# Patient Record
Sex: Male | Born: 1950
Health system: Southern US, Community
[De-identification: ages and names within clinical notes are randomized; demographics above are authoritative.]

## PROBLEM LIST (undated history)

## (undated) DIAGNOSIS — M1990 Unspecified osteoarthritis, unspecified site: Secondary | ICD-10-CM

## (undated) DIAGNOSIS — M20019 Mallet finger of unspecified finger(s): Secondary | ICD-10-CM

## (undated) DIAGNOSIS — E785 Hyperlipidemia, unspecified: Secondary | ICD-10-CM

## (undated) DIAGNOSIS — T7840XA Allergy, unspecified, initial encounter: Secondary | ICD-10-CM

## (undated) DIAGNOSIS — S76311A Strain of muscle, fascia and tendon of the posterior muscle group at thigh level, right thigh, initial encounter: Secondary | ICD-10-CM

## (undated) DIAGNOSIS — M2340 Loose body in knee, unspecified knee: Secondary | ICD-10-CM

## (undated) DIAGNOSIS — M7660 Achilles tendinitis, unspecified leg: Secondary | ICD-10-CM

## (undated) DIAGNOSIS — I499 Cardiac arrhythmia, unspecified: Secondary | ICD-10-CM

## (undated) DIAGNOSIS — I10 Essential (primary) hypertension: Secondary | ICD-10-CM

## (undated) DIAGNOSIS — G2581 Restless legs syndrome: Secondary | ICD-10-CM

## (undated) HISTORY — DX: Achilles tendinitis, unspecified leg: M76.60

## (undated) HISTORY — DX: Restless legs syndrome: G25.81

## (undated) HISTORY — DX: Mallet finger of unspecified finger(s): M20.019

## (undated) HISTORY — DX: Strain of muscle, fascia and tendon of the posterior muscle group at thigh level, right thigh, initial encounter: S76.311A

## (undated) HISTORY — DX: Allergy, unspecified, initial encounter: T78.40XA

## (undated) HISTORY — PX: SPINE SURGERY: SHX786

## (undated) HISTORY — DX: Hyperlipidemia, unspecified: E78.5

## (undated) HISTORY — DX: Unspecified osteoarthritis, unspecified site: M19.90

## (undated) HISTORY — DX: Loose body in knee, unspecified knee: M23.40

---

## 2004-05-08 ENCOUNTER — Emergency Department (HOSPITAL_COMMUNITY): Admission: EM | Admit: 2004-05-08 | Discharge: 2004-05-09 | Payer: Self-pay | Admitting: Emergency Medicine

## 2004-06-15 ENCOUNTER — Encounter: Admission: RE | Admit: 2004-06-15 | Discharge: 2004-06-15 | Payer: Self-pay | Admitting: Internal Medicine

## 2010-06-02 ENCOUNTER — Ambulatory Visit: Payer: Self-pay | Admitting: Sports Medicine

## 2010-06-02 DIAGNOSIS — M2340 Loose body in knee, unspecified knee: Secondary | ICD-10-CM

## 2010-06-02 DIAGNOSIS — M25569 Pain in unspecified knee: Secondary | ICD-10-CM

## 2010-06-02 HISTORY — DX: Loose body in knee, unspecified knee: M23.40

## 2010-11-25 ENCOUNTER — Ambulatory Visit: Admission: RE | Admit: 2010-11-25 | Discharge: 2010-11-25 | Payer: Self-pay | Source: Home / Self Care

## 2010-11-25 DIAGNOSIS — M79609 Pain in unspecified limb: Secondary | ICD-10-CM | POA: Insufficient documentation

## 2010-11-25 DIAGNOSIS — R269 Unspecified abnormalities of gait and mobility: Secondary | ICD-10-CM | POA: Insufficient documentation

## 2010-12-15 NOTE — Assessment & Plan Note (Signed)
Summary: 11:30 APPT POSSIBLE PFSS/MJD   Vital Signs:  Patient profile:   60 year old male Height:      71 inches Weight:      180.25 pounds BMI:     25.23 Pulse rate:   73 / minute BP sitting:   112 / 73  (left arm)  Vitals Entered By: Terese Door (June 02, 2010 12:07 PM) CC: NP-Possible PFSS left knee   CC:  NP-Possible PFSS left knee.  History of Present Illness: c/o Left ant knee pain started catching and locking just above knee cap wears knee strap by [place] This helps he thinks this started [place]  taking glucosamine and does feel better  even biking hurt at worst  elliptical is OK  doing some HS curls now at [employer]  ? if any swelling but no locking or giving way although he will get the catch that causes sharp pain  Physical Exam  General:  Well-developed,well-nourished,in no acute distress; alert,appropriate and cooperative throughout examination Msk:  knee exam shows no effusion; stable ligaments; negative Mcmurray's and provocative meniscal tests;  he soes get an occassional catch and some painful patellar compression;  patellar and quadriceps tendons unremarkable. some pat pouch swelling lags 2 deg from full extension on LT  Additional Exam:  MSK [place] left knee shows increase in fluid in suprapatellar pouch compared to RT bothe med and lat meniscus are fine pat tendon norm quad tendon looks intact except on undersurface there is a loose body that seems partially calcified  images saved   Impression & Recommendations:  Problem # 1:  KNEE PAIN, LEFT (ICD-719.46)  use strap and icing to control sxs quad exercises w limited knee bend ice after runs and avoid any fast downhills  if not better in 6 wks we could try injectin into loose body to try to get to resorb  Orders: [place] LIMITED ([id number])  Problem # 2:  LOOSE BODY IN KNEE (ICD-717.6)  will follow I hope w conservative care he can avoid having excision  reck 6  wks  Orders: [place] LIMITED ([id number])

## 2010-12-17 NOTE — Assessment & Plan Note (Signed)
Summary: poss pfss/ACM   Vital Signs:  Patient profile:   60 year old male BP sitting:   149 / 78  Vitals Entered By: Lillia Pauls CMA (November 25, 2010 9:49 AM) CC: poss pfss   CC:  poss pfss.  History of Present Illness: Pt here for f/u of L lateral knee pain, which has worsened since last visit.  Limps with running.   The knee has a loose calcific body in quad tendon which catches under sup knee  This was visualized on [place] at last exam gets some swelling but not always more with increased activity or running this pain increased after he did a hilly race  Also has upper hamstring pain on rt, very tight with running.    Takes meloxicam for pain, this is helpful.  Glenda has been running for 45 years multiple injuries to piriformis on RT starting in 1984 has had progressive inc in RT foot turnout 2/2 tightness  This is also associated w increase in RT HS pain at upper insertion  Physical Exam  General:  Well-developed,well-nourished,in no acute distress; alert,appropriate and cooperative throughout examination Msk:  L knee loose body in quad tendon This catches and pops under sup patella with knee flexion  LEFT lacks 3 degrees full knee extension lacks 30 degrees full knee flexion No visible effusion L knee ACL intact MCL intact LCL intact  Excellent abduction strength bilat Good hamstring strength bilat Good quad strength bilat FABER very tight bilat Very tight hamstring extension L > R Standing hip rotation better than lying bilat Extremities:  Liston Alba is antlagic does not push off left side fully on RT side he has about 30 deg of ECT rotation of foot this causes even less effective pushoff Additional Exam:  MSK [place] Left quad tendon has a 1 cm longitudinal calcification trans view shows this only 3 mm wide slt increase in doppler flow  There is also a samll suprapatellar puch effusion noted Pat tendon normal menisci normal small spur on joint line but not  significant  compared to last scan calcific area is thinner appearing   Impression & Recommendations:  Problem # 1:  LOOSE BODY IN KNEE (ICD-717.6)  This seems to be a partial tear of quad tendon that has now calcified and seems more incoroporated into tendon  will try him on NTG protocol I think injection would be difficult without risk of tearing tendon  reck in 1 mo and rescan tendon  Orders: [place] LIMITED ([id number])  Problem # 2:  KNEE PAIN, LEFT (ICD-719.46)  His updated medication list for this problem includes:    Meloxicam 15 Mg Tabs (Meloxicam) .Marland Kitchen... Take 1 by mouth once daily  uses meloxicam as needed  warned sideeffects  Orders: [place] LIMITED ([id number])  Problem # 3:  ABNORMALITY OF GAIT (ICD-781.2) He has  a contracture of piriformis I suspect that is really altering gait  now complicated by his loose body  given stretches and will evaluate on RTC  Problem # 4:  THIGH PAIN (ICD-729.5)  Orders: Garment,belt,sleeve or other covering ,elastic or similar stretch (U1324) this seems like chronic HS tightness  will try compression sleeve  Complete Medication List: 1)  Nitroglycerin 0.1 Mg/hr Pt24 (Nitroglycerin) .... Apply 1/2 patch to left knee daily as directed.  change daily 2)  Meloxicam 15 Mg Tabs (Meloxicam) .... Take 1 by mouth once daily  Patient Instructions: 1)  Wear thigh sleeve on rt thigh over area of tightness and pain. 2)  Use 1/2  nitroglycerin patch over 1 cm area of left knee daily.  Change patch every day. 3)  Return for follow up in 1 month Prescriptions: MELOXICAM 15 MG TABS (MELOXICAM) take 1 by mouth once daily  #90 x 3   Entered and Authorized by:   Enid Baas MD   Signed by:   Enid Baas MD on 11/25/2010   Method used:   Electronically to        [facility]  [phone]* (retail)       [address] [place], [place]  [address]       Ph: [phone] or [phone]       Fax: [phone]   RxID:   [id number] NITROGLYCERIN  0.1 MG/HR PT24 (NITROGLYCERIN) Apply 1/2 patch to left knee daily as directed.  Change daily  #30 x 2   Entered and Authorized by:   Enid Baas MD   Signed by:   Enid Baas MD on 11/25/2010   Method used:   Electronically to        [facility]  [phone]* (retail)       [address] [place], [place]  [address]       Ph: [phone] or [phone]       Fax: [phone]   RxID:   [id number]    Orders Added: 1)  Garment,belt,sleeve or other covering ,elastic or similar stretch [A4466] 2)  Est. Patient Level IV [81829] 3)  [place] LIMITED [[id number]]

## 2010-12-28 ENCOUNTER — Ambulatory Visit: Payer: Self-pay | Admitting: Sports Medicine

## 2011-01-25 ENCOUNTER — Ambulatory Visit (INDEPENDENT_AMBULATORY_CARE_PROVIDER_SITE_OTHER): Payer: Self-pay | Admitting: Sports Medicine

## 2011-01-25 ENCOUNTER — Encounter: Payer: Self-pay | Admitting: Sports Medicine

## 2011-01-25 DIAGNOSIS — M25569 Pain in unspecified knee: Secondary | ICD-10-CM

## 2011-01-25 DIAGNOSIS — M2340 Loose body in knee, unspecified knee: Secondary | ICD-10-CM

## 2011-01-25 DIAGNOSIS — M79609 Pain in unspecified limb: Secondary | ICD-10-CM

## 2011-02-02 NOTE — Assessment & Plan Note (Signed)
Summary: FU/MC/MJD   Vital Signs:  Patient profile:   60 year old male BP sitting:   132 / 81  Vitals Entered By: Jonathan Beasley CMA (January 25, 2011 8:59 AM)  CC:  L knee pain & thigh pain.  History of Present Illness: Jonathan Beasley is a 60 yo male who returned to clinic for a F/U appointment for L knee pain caused by a loose body in knee.  Patient states he has been using nitroglycerin patche for 6 weeks and has not noticed any improvement.  He also states his knee pain has not worsened.  Patient states the knee is most problematic and painful while he is trying to sleep and is wondering if he should pursue surgical options for removal of foreign body. He is willing to continue NTG therapy but will need a refill. Patient also states that he is still experiencing tightness in his hamstring muscles, but the hamstring compression sleeve has helped so is interested in getting one for the left leg as well.  Physical Exam  General:  alert, well-nourished, and healthy-appearing.   Msk:  LEFT No visible effusion L knee full knee extension ACL intact MCL intact LCL intact   has positive clicking each time we do knee flexion. This can sometimes be painful. Meniscus testing is unremarkable.  Excellent abduction strength bilat Good hamstring strength bilat Good quad strength bilat   Additional Exam:  U/S showed improved appearance since last visit. Neovessels are now present in the quad tendon, showing increased blood flow. Less effusion is noted around the spur.   Impression & Recommendations:  Problem # 1:  LOOSE BODY IN KNEE (ICD-717.6)  Partial tear of quad tendon that is calcified   Continue NTG protocol Injection would be difficult without risk of tearing tendon Would hold off on seeking surgical intervention at this time, since US showed slight improvement Knee compression sleeve for left knee - during exercise  reck in 1 mo  Orders: [place] LIMITED ([id number])  Problem # 2:  THIGH  PAIN (ICD-729.5) Patient has chronic hamstring tightness. He has noticed improvement while wearing compression sleeve on right thigh Will get a compression sleeve for left thigh  Garment,belt,sleeve or other covering ,elastic or similar stretch (J8119)  Problem # 3:  KNEE PAIN, LEFT (ICD-719.46)  His updated medication list for this problem includes:    Meloxicam 15 Mg Tabs (Meloxicam) .Marland Kitchen... Take 1 by mouth once daily  Orders: Garment,belt,sleeve or other covering ,elastic or similar stretch (J4782) [place] LIMITED ([id number])   no real change in pain level but he has been active without this getting any worse  Complete Medication List: 1)  Nitroglycerin 0.1 Mg/hr Pt24 (Nitroglycerin) .... Apply 1/2 patch to left knee daily as directed.  change daily 2)  Meloxicam 15 Mg Tabs (Meloxicam) .... Take 1 by mouth once daily  Patient Instructions: 1)  Start baby aspirin (81 mg) one a day by mouth. 2)  Continue using 1/2 nitroglycerin patch over 1 cm area on L knee daily. 3)  Wear compression sleeve on L knee when exercising. 4)  Wear thigh sleeve on rt & lt thigh over area of tightness and pain 5)  Return for follow up in 1 month. Prescriptions: NITROGLYCERIN 0.1 MG/HR PT24 (NITROGLYCERIN) Apply 1/2 patch to left knee daily as directed.  Change daily  #30 x 2   Entered and Authorized by:   Enid Baas MD   Signed by:   Enid Baas MD on 01/25/2011   Method used:  Electronically to        [facility]  [phone]* (retail)       [address] [place], [place]  [address]       Ph: [phone] or [phone]       Fax: [phone]   RxID:   [id number] NITROGLYCERIN 0.1 MG/HR PT24 (NITROGLYCERIN) Apply 1/2 patch to left knee daily as directed.  Change daily  #30 x 2   Entered and Authorized by:   Enid Baas MD   Signed by:   Enid Baas MD on 01/25/2011   Method used:   Print then Give to Patient   RxID:   [id number]    Orders Added: 1)  Garment,belt,sleeve or other  covering ,elastic or similar stretch [A4466] 2)  Est. Patient Level III [87564] 3)  [place] LIMITED [[id number]]

## 2011-03-11 ENCOUNTER — Ambulatory Visit: Payer: Self-pay | Admitting: Sports Medicine

## 2011-06-03 ENCOUNTER — Other Ambulatory Visit: Payer: Self-pay | Admitting: *Deleted

## 2011-06-03 MED ORDER — NITROGLYCERIN 0.1 MG/HR TD PT24: MEDICATED_PATCH | TRANSDERMAL | Status: DC

## 2011-08-03 ENCOUNTER — Ambulatory Visit (INDEPENDENT_AMBULATORY_CARE_PROVIDER_SITE_OTHER): Payer: 59 | Admitting: Sports Medicine

## 2011-08-03 VITALS — BP 140/80

## 2011-08-03 DIAGNOSIS — M2340 Loose body in knee, unspecified knee: Secondary | ICD-10-CM

## 2011-08-03 DIAGNOSIS — M20019 Mallet finger of unspecified finger(s): Secondary | ICD-10-CM

## 2011-08-03 DIAGNOSIS — M25569 Pain in unspecified knee: Secondary | ICD-10-CM

## 2011-08-03 HISTORY — DX: Mallet finger of unspecified finger(s): M20.019

## 2011-08-03 MED ORDER — NITROGLYCERIN 0.1 MG/HR TD PT24: MEDICATED_PATCH | TRANSDERMAL | Status: DC

## 2011-08-03 NOTE — Assessment & Plan Note (Signed)
Trial on NTG Use patellar strap If not better 1 month - consider referral xtrain or try to stay on level for most activity

## 2011-08-03 NOTE — Assessment & Plan Note (Signed)
Quad tendon is much improved Spur remains Cont use of NTG for full 6 mos

## 2011-08-03 NOTE — Assessment & Plan Note (Signed)
RT 3rd finger Splinted but probably not sufficiently Still 7 weeks old and has a small bony avulsion on [place]  Send to Dr Teressa Senter for opinion about whether this is salvagable with surgery or should we live with degree of flexion

## 2011-08-03 NOTE — Progress Notes (Signed)
  Subjective:    Patient ID: Jonathan Beasley, male    DOB: 1950-12-08, 60 y.o.   MRN: [id number]  HPI 60 yo male presents for bilateral knee pain, left chronic pain, right new onset pain, right 3rd finger injury, and right shoulder.  1. Left knee pain Improved with NTG patch  2. Right knee pain Acute injury while running a race on Saturday.  Acute pain while running downhill causing him to have to stop running. Initially pain with limping Feels that knee is catching under patellar tendon  3 mallet finger Occurred 10 weeks ago Wore a splint intermittantly   Review of Systems     Objective:   Physical Exam  NAD  Left knee is stable to exam today with good extension Tight on flexion but normal range No swelling  RT knee no effusion  knee locks breifly after flexion and then extension This seems under patella Ligaments and cartilage stable  Musc u/s Right knee with loose body from patella; calcified on inferior surface of tendon. thickened patellar tendon proximally with some hypoechoic change  Left knee with normal size patellar tendon Quad tendon shows spur as before but tendon hypoechoic change and partial tear seems resolved  Right 3rd mallet finger - small bony fragment Disruption of attachment    Assessment & Plan:

## 2011-08-09 ENCOUNTER — Ambulatory Visit (HOSPITAL_BASED_OUTPATIENT_CLINIC_OR_DEPARTMENT_OTHER)
Admission: RE | Admit: 2011-08-09 | Discharge: 2011-08-09 | Disposition: A | Discharge status: Home / Self Care | Payer: 59 | Source: Ambulatory Visit | Attending: Orthopedic Surgery | Admitting: Orthopedic Surgery

## 2011-08-09 DIAGNOSIS — M2240 Chondromalacia patellae, unspecified knee: Secondary | ICD-10-CM | POA: Insufficient documentation

## 2011-08-09 DIAGNOSIS — G2581 Restless legs syndrome: Secondary | ICD-10-CM | POA: Insufficient documentation

## 2011-08-09 DIAGNOSIS — M23302 Other meniscus derangements, unspecified lateral meniscus, unspecified knee: Secondary | ICD-10-CM | POA: Insufficient documentation

## 2011-08-09 DIAGNOSIS — Z01812 Encounter for preprocedural laboratory examination: Secondary | ICD-10-CM | POA: Insufficient documentation

## 2011-08-24 ENCOUNTER — Ambulatory Visit: Payer: 59 | Admitting: Sports Medicine

## 2011-08-26 ENCOUNTER — Ambulatory Visit: Payer: 59 | Admitting: Sports Medicine

## 2011-08-31 ENCOUNTER — Ambulatory Visit: Payer: 59 | Admitting: Sports Medicine

## 2011-09-16 ENCOUNTER — Ambulatory Visit: Payer: 59

## 2011-09-20 ENCOUNTER — Ambulatory Visit: Payer: 59

## 2011-10-26 ENCOUNTER — Encounter: Payer: Self-pay | Admitting: Internal Medicine

## 2011-11-16 HISTORY — PX: KNEE ARTHROSCOPY: SHX127

## 2011-11-22 ENCOUNTER — Other Ambulatory Visit: Payer: 59 | Admitting: Internal Medicine

## 2012-06-13 ENCOUNTER — Other Ambulatory Visit: Payer: Self-pay | Admitting: Internal Medicine

## 2012-06-13 NOTE — Telephone Encounter (Signed)
Chart pulled °

## 2012-06-21 ENCOUNTER — Other Ambulatory Visit: Payer: Self-pay | Admitting: *Deleted

## 2012-06-21 MED ORDER — ROSUVASTATIN CALCIUM 10 MG PO TABS: 10.0000 mg | ORAL_TABLET | Freq: Every day | ORAL | Status: DC

## 2012-07-18 ENCOUNTER — Encounter: Payer: Self-pay | Admitting: Family Medicine

## 2012-07-18 ENCOUNTER — Ambulatory Visit (INDEPENDENT_AMBULATORY_CARE_PROVIDER_SITE_OTHER): Payer: 59 | Admitting: Family Medicine

## 2012-07-18 VITALS — BP 152/94 | HR 64 | Temp 98.0°F | Resp 16 | Ht 69.5 in | Wt 190.0 lb

## 2012-07-18 DIAGNOSIS — Z0000 Encounter for general adult medical examination without abnormal findings: Secondary | ICD-10-CM

## 2012-07-18 DIAGNOSIS — G2581 Restless legs syndrome: Secondary | ICD-10-CM

## 2012-07-18 DIAGNOSIS — M7660 Achilles tendinitis, unspecified leg: Secondary | ICD-10-CM

## 2012-07-18 DIAGNOSIS — E785 Hyperlipidemia, unspecified: Secondary | ICD-10-CM

## 2012-07-18 DIAGNOSIS — M25569 Pain in unspecified knee: Secondary | ICD-10-CM

## 2012-07-18 LAB — COMPREHENSIVE METABOLIC PANEL
ALT: 22 U/L (ref 0–53)
AST: 34 U/L (ref 0–37)
Albumin: 4.3 g/dL (ref 3.5–5.2)
Alkaline Phosphatase: 55 U/L (ref 39–117)
BUN: 22 mg/dL (ref 6–23)
CO2: 25 mEq/L (ref 19–32)
Calcium: 9.4 mg/dL (ref 8.4–10.5)
Chloride: 108 mEq/L (ref 96–112)
Creat: 0.85 mg/dL (ref 0.50–1.35)
Glucose, Bld: 99 mg/dL (ref 70–99)
Potassium: 4.5 mEq/L (ref 3.5–5.3)
Sodium: 141 mEq/L (ref 135–145)
Total Bilirubin: 0.6 mg/dL (ref 0.3–1.2)
Total Protein: 7.1 g/dL (ref 6.0–8.3)

## 2012-07-18 LAB — LIPID PANEL
Cholesterol: 158 mg/dL (ref 0–200)
HDL: 34 mg/dL — ABNORMAL LOW (ref >39)
LDL Cholesterol: 88 mg/dL (ref 0–99)
Total CHOL/HDL Ratio: 4.6 Ratio
Triglycerides: 180 mg/dL — ABNORMAL HIGH (ref <150)
VLDL: 36 mg/dL (ref 0–40)

## 2012-07-18 LAB — POCT UA - MICROSCOPIC ONLY
Casts, Ur, LPF, POC: NEGATIVE
Crystals, Ur, HPF, POC: NEGATIVE
Yeast, UA: NEGATIVE

## 2012-07-18 LAB — POCT CBC
Granulocyte percent: 63.3 %G (ref 37–80)
HCT, POC: 45.6 % (ref 43.5–53.7)
Hemoglobin: 14.6 g/dL (ref 14.1–18.1)
Lymph, poc: 2 (ref 0.6–3.4)
MCH, POC: 28.8 pg (ref 27–31.2)
MCHC: 32 g/dL (ref 31.8–35.4)
MCV: 90 fL (ref 80–97)
MID (cbc): 0.5 (ref 0–0.9)
MPV: 8.1 fL (ref 0–99.8)
POC Granulocyte: 4.4 (ref 2–6.9)
POC LYMPH PERCENT: 29 %L (ref 10–50)
POC MID %: 7.7 %M (ref 0–12)
Platelet Count, POC: 277 10*3/uL (ref 142–424)
RBC: 5.07 M/uL (ref 4.69–6.13)
RDW, POC: 12.9 %
WBC: 6.9 10*3/uL (ref 4.6–10.2)

## 2012-07-18 LAB — IFOBT (OCCULT BLOOD): IFOBT: POSITIVE

## 2012-07-18 LAB — POCT URINALYSIS DIPSTICK
Bilirubin, UA: NEGATIVE
Blood, UA: NEGATIVE
Glucose, UA: NEGATIVE
Ketones, UA: NEGATIVE
Leukocytes, UA: NEGATIVE
Nitrite, UA: NEGATIVE
Protein, UA: NEGATIVE
Spec Grav, UA: >=1.03
Urobilinogen, UA: 0.2
pH, UA: 6

## 2012-07-18 LAB — POCT SEDIMENTATION RATE: POCT SED RATE: 15 mm/hr (ref 0–22)

## 2012-07-18 MED ORDER — ROSUVASTATIN CALCIUM 10 MG PO TABS: 10.0000 mg | ORAL_TABLET | Freq: Every day | ORAL | Status: DC

## 2012-07-18 MED ORDER — HYDROCODONE-ACETAMINOPHEN 5-500 MG PO TABS: 1.0000 | ORAL_TABLET | ORAL | Status: DC | PRN

## 2012-07-18 MED ORDER — ROPINIROLE HCL 1 MG PO TABS: 1.0000 mg | ORAL_TABLET | Freq: Every day | ORAL | Status: DC

## 2012-07-18 MED ORDER — CYCLOBENZAPRINE HCL 10 MG PO TABS: 10.0000 mg | ORAL_TABLET | Freq: Three times a day (TID) | ORAL | Status: DC | PRN

## 2012-07-18 NOTE — Progress Notes (Signed)
@[facility] @  Patient ID: Jonathan Beasley MRN: [id number], DOB: Apr 02, 1951 61 y.o. Date of Encounter: 07/18/2012, 8:20 AM  Primary Physician: No primary provider on file.  Chief Complaint: Physical (CPE)  HPI: 61 y.o. y/o male with history noted below here for CPE.  Complaints:  Elevated blood pressure x 1 year (F/H heart disease)  Knee surgery R 1 year ago after falling down stairs (Dr. Turner Daniels) with chronic subsequent pain  Left hamstring injury  Left achilles tendonitis.  Limping and unable to sleep well.  Took wife's pain medicine  Review of Systems: Consitutional: No fever, chills, fatigue, night sweats, lymphadenopathy, or weight changes. Eyes: No visual changes, eye redness, or discharge. ENT/Mouth: Ears: No otalgia, tinnitus, hearing loss, discharge. Nose: No congestion, rhinorrhea, sinus pain, or epistaxis. Throat: No sore throat, post nasal drip, or teeth pain. Cardiovascular: No CP, palpitations, diaphoresis, DOE, edema, orthopnea, PND. Respiratory: No cough, hemoptysis, SOB, or wheezing. Gastrointestinal: No anorexia, dysphagia, reflux, pain, nausea, vomiting, hematemesis, diarrhea, constipation, BRBPR, or melena. Genitourinary: No dysuria, frequency, urgency, hematuria, incontinence, nocturia, decreased urinary stream, discharge, impotence, or testicular pain/masses. Musculoskeletal: No decreased ROM, stiffness, joint swelling, or weakness. Skin: No rash, erythema, lesion changes, pain, warmth, jaundice, or pruritis. Neurological: No headache, dizziness, syncope, seizures, tremors, memory loss, coordination problems, or paresthesias. Psychological: No anxiety, depression, hallucinations, SI/HI. Endocrine: No fatigue, polydipsia, polyphagia, polyuria, or known diabetes. All other systems were reviewed and are otherwise negative.  Past Medical History  Diagnosis Date  . Hyperlipidemia   . Restless legs   . Arthritis      No past surgical history on file.  Home Meds:  Prior  to Admission medications   Medication Sig Start Date End Date Taking? Authorizing Provider  cyclobenzaprine (FLEXERIL) 10 MG tablet Take 10 mg by mouth 3 (three) times daily as needed.   Yes Historical Provider, MD  HYDROcodone-acetaminophen (VICODIN) 5-500 MG per tablet Take 1 tablet by mouth every 4 (four) hours as needed.   Yes Historical Provider, MD  meloxicam (MOBIC) 15 MG tablet Take 15 mg by mouth as needed.   Yes Historical Provider, MD  rOPINIRole (REQUIP) 1 MG tablet Take 1 tablet (1 mg total) by mouth at bedtime. Needs office visit for more 06/13/12  Yes Heather M Marte, PA-C  rosuvastatin (CRESTOR) 10 MG tablet Take 1 tablet (10 mg total) by mouth daily. 06/21/12 06/21/13 Yes Pattricia Boss, PA-C  nitroGLYCERIN (NITRODUR - DOSED IN MG/24 HR) 0.1 mg/hr Apply 1/2 patch to affected area daily.  Change patch every 24 hours. 08/03/11   Enid Baas, MD    Allergies:  Allergies  Allergen Reactions  . Penicillins Other (See Comments)    Childhood- "deathly ill"    History   Social History  . Marital Status: Married    Spouse Name: N/A    Number of Children: N/A  . Years of Education: N/A   Occupational History  . Not on file.   Social History Main Topics  . Smoking status: Never Smoker   . Smokeless tobacco: Not on file  . Alcohol Use: Not on file  . Drug Use: Not on file  . Sexually Active: Not on file   Other Topics Concern  . Not on file   Social History Narrative  . No narrative on file    Family History  Problem Relation Age of Onset  . Heart disease Father   . Heart disease Maternal Grandfather   . Heart disease Paternal Grandfather     Physical  Exam:  142/84 recheck Blood pressure 152/94, pulse 64, temperature 98 F (36.7 C), temperature source Oral, resp. rate 16, height 5' 9.5" (1.765 m), weight 190 lb (86.183 kg), SpO2 96.00%.  General: Well developed, well nourished, in no acute distress. HEENT: Normocephalic, atraumatic. Conjunctiva pink, sclera  non-icteric. Pupils 2 mm constricting to 1 mm, round, regular, and equally reactive to light and accomodation. EOMI. Internal auditory canal clear. TMs with good cone of light and without pathology. Nasal mucosa pink. Nares are without discharge. No sinus tenderness. Oral mucosa pink. Dentition multiple fillings. Pharynx without exudate.   Neck: Supple. Trachea midline. No thyromegaly. Full ROM. No lymphadenopathy. Lungs: Clear to auscultation bilaterally without wheezes, rales, or rhonchi. Breathing is of normal effort and unlabored. Cardiovascular: RRR with S1 S2. No murmurs, rubs, or gallops appreciated. Distal pulses 2+ symmetrically. No carotid or abdominal bruits Abdomen: Soft, non-tender, non-distended with normoactive bowel sounds. No hepatosplenomegaly or masses. No rebound/guarding. No CVA tenderness. Without hernias.  Rectal: No external hemorrhoids or fissures. Rectal vault without masses.  Genitourinary: uncircumcised male. No penile lesions. Testes descended bilaterally, and smooth without tenderness or masses.  Musculoskeletal: Full range of motion and 5/5 strength throughout. Without swelling, atrophy, tenderness, crepitus, or warmth. Extremities without clubbing, cyanosis, or edema. Calves supple. Skin: Warm and moist without erythema, ecchymosis, wounds, or rash. Neuro: A+Ox3. CN II-XII grossly intact. Moves all extremities spontaneously. Full sensation throughout. Normal gait. DTR 2+ throughout upper and lower extremities. Finger to nose intact. Psych:  Responds to questions appropriately with a normal affect.   Studies: CBC, CMET, Lipid, PSA, TSH, Vitamin D all pending. UA:   Assessment/Plan:  61 y.o. y/o healthy male here for CPE -Patient will follow up with PT (Rx given) Patient has colonoscopy sched with Dr. Ewing Schlein  Signed, Elvina Sidle, MD 07/18/2012 8:20 AM     Results for orders placed in visit on 07/18/12  POCT CBC      Component Value Range   WBC 6.9  4.6 -  10.2 K/uL   Lymph, poc 2.0  0.6 - 3.4   POC LYMPH PERCENT 29.0  10 - 50 %L   MID (cbc) 0.5  0 - 0.9   POC MID % 7.7  0 - 12 %M   POC Granulocyte 4.4  2 - 6.9   Granulocyte percent 63.3  37 - 80 %G   RBC 5.07  4.69 - 6.13 M/uL   Hemoglobin 14.6  14.1 - 18.1 g/dL   HCT, POC 02.7  25.3 - 53.7 %   MCV 90.0  80 - 97 fL   MCH, POC 28.8  27 - 31.2 pg   MCHC 32.0  31.8 - 35.4 g/dL   RDW, POC 66.4     Platelet Count, POC 277  142 - 424 K/uL   MPV 8.1  0 - 99.8 fL  POCT UA - MICROSCOPIC ONLY      Component Value Range   WBC, Ur, HPF, POC 1-2     RBC, urine, microscopic 0-2     Bacteria, U Microscopic trace     Mucus, UA trace     Epithelial cells, urine per micros 1-2     Crystals, Ur, HPF, POC neg     Casts, Ur, LPF, POC neg     Yeast, UA neg    POCT URINALYSIS DIPSTICK      Component Value Range   Color, UA yellow     Clarity, UA clear     Glucose, UA neg  Bilirubin, UA neg     Ketones, UA neg     Spec Grav, UA >=1.030     Blood, UA neg     pH, UA 6.0     Protein, UA neg     Urobilinogen, UA 0.2     Nitrite, UA neg     Leukocytes, UA Negative    POCT SEDIMENTATION RATE      Component Value Range   POCT SED RATE 15  0 - 22 mm/hr  IFOBT (OCCULT BLOOD)      Component Value Range   IFOBT Positive     1. Health care maintenance  POCT CBC, POCT UA - Microscopic Only, POCT urinalysis dipstick, Comprehensive metabolic panel, Lipid panel, Vitamin D, 25-hydroxy, IFOBT POC (occult bld, rslt in office)  2. Hyperlipidemia  Lipid panel, rosuvastatin (CRESTOR) 10 MG tablet  3. Knee pain, acute  POCT SEDIMENTATION RATE, HYDROcodone-acetaminophen (VICODIN) 5-500 MG per tablet, cyclobenzaprine (FLEXERIL) 10 MG tablet  4. Restless legs  rOPINIRole (REQUIP) 1 MG tablet   Meloxicam 15 mg #90 1 po  3 RF We spent time discussing non impact sports and rehab.  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

## 2012-07-19 LAB — VITAMIN D 25 HYDROXY (VIT D DEFICIENCY, FRACTURES): Vit D, 25-Hydroxy: 28 ng/mL — ABNORMAL LOW (ref 30–89)

## 2012-08-10 ENCOUNTER — Other Ambulatory Visit: Payer: Self-pay

## 2012-08-10 DIAGNOSIS — G2581 Restless legs syndrome: Secondary | ICD-10-CM

## 2012-08-10 MED ORDER — ROPINIROLE HCL 1 MG PO TABS: 1.0000 mg | ORAL_TABLET | Freq: Every day | ORAL | Status: DC

## 2012-11-10 ENCOUNTER — Other Ambulatory Visit: Payer: Self-pay | Admitting: Physician Assistant

## 2012-12-11 ENCOUNTER — Ambulatory Visit: Payer: 59

## 2012-12-11 ENCOUNTER — Ambulatory Visit (INDEPENDENT_AMBULATORY_CARE_PROVIDER_SITE_OTHER): Payer: 59 | Admitting: Family Medicine

## 2012-12-11 VITALS — BP 137/79 | HR 74 | Temp 98.0°F | Resp 16 | Ht 70.5 in | Wt 189.2 lb

## 2012-12-11 DIAGNOSIS — M25569 Pain in unspecified knee: Secondary | ICD-10-CM

## 2012-12-11 DIAGNOSIS — G2581 Restless legs syndrome: Secondary | ICD-10-CM

## 2012-12-11 DIAGNOSIS — M79672 Pain in left foot: Secondary | ICD-10-CM

## 2012-12-11 DIAGNOSIS — M25561 Pain in right knee: Secondary | ICD-10-CM

## 2012-12-11 DIAGNOSIS — M79609 Pain in unspecified limb: Secondary | ICD-10-CM

## 2012-12-11 MED ORDER — MELOXICAM 15 MG PO TABS: 15.0000 mg | ORAL_TABLET | ORAL | Status: DC | PRN

## 2012-12-11 MED ORDER — ROPINIROLE HCL 1 MG PO TABS: ORAL_TABLET | ORAL | Status: DC

## 2012-12-11 MED ORDER — PREDNISONE 20 MG PO TABS
ORAL_TABLET | ORAL | Status: DC
Start: 2012-12-11 — End: 2013-07-26

## 2012-12-11 MED ORDER — HYDROCODONE-ACETAMINOPHEN 5-325 MG PO TABS: 1.0000 | ORAL_TABLET | Freq: Four times a day (QID) | ORAL | Status: DC | PRN

## 2012-12-11 NOTE — Progress Notes (Signed)
62 yo left achilles insertion swelling, tenderness, and redness.  No recent injury.  No new activity  Objective:  left achilles insertion swelling, tenderness, and redness. [facility] reading (PRIMARY) by  Dr. Milus Glazier:  Left heel:  STS only  Spent 45 minutes face to face discussing the heel, exercise, knee pain, restless legs.  Assessment:  Multiple problems discussed  1. Restless legs  rOPINIRole (REQUIP) 1 MG tablet  2. Knee pain, right  meloxicam (MOBIC) 15 MG tablet, HYDROcodone-acetaminophen (NORCO/VICODIN) 5-325 MG per tablet  3. Pain of left heel  DG Foot 2 Views Left, predniSONE (DELTASONE) 20 MG tablet   Referred to Amado Coe, PT for 2 weeks 3 x per week.  This can be renewed every 2 weeks depending on progress.

## 2012-12-12 ENCOUNTER — Other Ambulatory Visit: Payer: Self-pay | Admitting: Family Medicine

## 2012-12-12 DIAGNOSIS — M79672 Pain in left foot: Secondary | ICD-10-CM

## 2013-01-25 ENCOUNTER — Telehealth: Payer: Self-pay | Admitting: Radiology

## 2013-01-25 NOTE — Telephone Encounter (Signed)
Letter sent to patient.

## 2013-03-09 ENCOUNTER — Ambulatory Visit (INDEPENDENT_AMBULATORY_CARE_PROVIDER_SITE_OTHER): Payer: 59 | Admitting: Family Medicine

## 2013-03-09 VITALS — BP 130/78 | HR 79 | Temp 97.9°F | Resp 16 | Ht 70.0 in | Wt 185.8 lb

## 2013-03-09 DIAGNOSIS — M7660 Achilles tendinitis, unspecified leg: Secondary | ICD-10-CM

## 2013-03-09 DIAGNOSIS — M7662 Achilles tendinitis, left leg: Secondary | ICD-10-CM

## 2013-03-09 DIAGNOSIS — M25569 Pain in unspecified knee: Secondary | ICD-10-CM

## 2013-03-09 DIAGNOSIS — M25561 Pain in right knee: Secondary | ICD-10-CM

## 2013-03-09 MED ORDER — METHYLPREDNISOLONE 4 MG PO KIT: PACK | ORAL | Status: DC

## 2013-03-09 MED ORDER — HYDROCODONE-ACETAMINOPHEN 5-325 MG PO TABS: 1.0000 | ORAL_TABLET | Freq: Two times a day (BID) | ORAL | Status: DC | PRN

## 2013-03-09 NOTE — Progress Notes (Signed)
62 yo man with hyperlipidemia (not having any myalgias or abdominal pain)  Complains today of left foot pain x 1 year. He has had this chronically, and he improved with physical therapist treatments using iotophoresis.  The meloxicam helps some.  Using the hydrocodone infrequently.  Social:  Selling his house which has been stressful.  Darl Pikes, his wife, is doing okay with her horrible post-op fistula  Objective:  NAD Moderately swollen Achilles insertion.  Assessment:  Horrible amount of inflammation.  Achilles tendonitis, left - Plan: Ambulatory referral to Orthopedic Surgery, methylPREDNISolone (MEDROL, PAK,) 4 MG tablet, HYDROcodone-acetaminophen (NORCO/VICODIN) 5-325 MG per tablet  Knee pain, right - Plan: HYDROcodone-acetaminophen (NORCO/VICODIN) 5-325 MG per tablet  Elvina Sidle, MD

## 2013-05-07 ENCOUNTER — Other Ambulatory Visit: Payer: Self-pay | Admitting: Family Medicine

## 2013-07-26 ENCOUNTER — Ambulatory Visit (INDEPENDENT_AMBULATORY_CARE_PROVIDER_SITE_OTHER): Payer: 59 | Admitting: Family Medicine

## 2013-07-26 ENCOUNTER — Encounter: Payer: Self-pay | Admitting: Family Medicine

## 2013-07-26 VITALS — BP 112/78 | HR 62 | Temp 98.1°F | Resp 16 | Ht 69.75 in | Wt 186.4 lb

## 2013-07-26 DIAGNOSIS — M25561 Pain in right knee: Secondary | ICD-10-CM

## 2013-07-26 DIAGNOSIS — E785 Hyperlipidemia, unspecified: Secondary | ICD-10-CM

## 2013-07-26 DIAGNOSIS — M7660 Achilles tendinitis, unspecified leg: Secondary | ICD-10-CM

## 2013-07-26 DIAGNOSIS — Z0000 Encounter for general adult medical examination without abnormal findings: Secondary | ICD-10-CM

## 2013-07-26 DIAGNOSIS — G2581 Restless legs syndrome: Secondary | ICD-10-CM

## 2013-07-26 DIAGNOSIS — M7662 Achilles tendinitis, left leg: Secondary | ICD-10-CM

## 2013-07-26 DIAGNOSIS — M25562 Pain in left knee: Secondary | ICD-10-CM

## 2013-07-26 HISTORY — DX: Achilles tendinitis, unspecified leg: M76.60

## 2013-07-26 LAB — CBC
HCT: 44.5 % (ref 39.0–52.0)
Hemoglobin: 15.4 g/dL (ref 13.0–17.0)
MCH: 29.6 pg (ref 26.0–34.0)
MCHC: 34.6 g/dL (ref 30.0–36.0)
MCV: 85.6 fL (ref 78.0–100.0)
Platelets: 259 10*3/uL (ref 150–400)
RBC: 5.2 MIL/uL (ref 4.22–5.81)
RDW: 13.2 % (ref 11.5–15.5)
WBC: 5.8 10*3/uL (ref 4.0–10.5)

## 2013-07-26 LAB — COMPREHENSIVE METABOLIC PANEL
ALT: 24 U/L (ref 0–53)
AST: 29 U/L (ref 0–37)
Albumin: 4.5 g/dL (ref 3.5–5.2)
Alkaline Phosphatase: 59 U/L (ref 39–117)
BUN: 14 mg/dL (ref 6–23)
CO2: 24 mEq/L (ref 19–32)
Calcium: 9.2 mg/dL (ref 8.4–10.5)
Chloride: 103 mEq/L (ref 96–112)
Creat: 0.83 mg/dL (ref 0.50–1.35)
Glucose, Bld: 97 mg/dL (ref 70–99)
Potassium: 4.4 mEq/L (ref 3.5–5.3)
Sodium: 135 mEq/L (ref 135–145)
Total Bilirubin: 0.7 mg/dL (ref 0.3–1.2)
Total Protein: 7.3 g/dL (ref 6.0–8.3)

## 2013-07-26 LAB — LIPID PANEL
Cholesterol: 159 mg/dL (ref 0–200)
HDL: 33 mg/dL — ABNORMAL LOW (ref >39)
LDL Cholesterol: 78 mg/dL (ref 0–99)
Total CHOL/HDL Ratio: 4.8 Ratio
Triglycerides: 240 mg/dL — ABNORMAL HIGH (ref <150)
VLDL: 48 mg/dL — ABNORMAL HIGH (ref 0–40)

## 2013-07-26 LAB — POCT URINALYSIS DIPSTICK
Bilirubin, UA: NEGATIVE
Blood, UA: NEGATIVE
Glucose, UA: NEGATIVE
Ketones, UA: NEGATIVE
Leukocytes, UA: NEGATIVE
Nitrite, UA: NEGATIVE
Protein, UA: NEGATIVE
Spec Grav, UA: 1.02
Urobilinogen, UA: 0.2
pH, UA: 6

## 2013-07-26 LAB — PSA: PSA: 0.6 ng/mL (ref <=4.00)

## 2013-07-26 LAB — IFOBT (OCCULT BLOOD): IFOBT: POSITIVE

## 2013-07-26 LAB — TSH: TSH: 1.489 u[IU]/mL (ref 0.350–4.500)

## 2013-07-26 MED ORDER — CYCLOBENZAPRINE HCL 10 MG PO TABS: 10.0000 mg | ORAL_TABLET | Freq: Three times a day (TID) | ORAL | Status: DC | PRN

## 2013-07-26 MED ORDER — MELOXICAM 15 MG PO TABS: 15.0000 mg | ORAL_TABLET | ORAL | Status: DC | PRN

## 2013-07-26 MED ORDER — ROPINIROLE HCL 1 MG PO TABS: 1.0000 mg | ORAL_TABLET | Freq: Every day | ORAL | Status: DC

## 2013-07-26 MED ORDER — ROSUVASTATIN CALCIUM 10 MG PO TABS: 10.0000 mg | ORAL_TABLET | Freq: Every day | ORAL | Status: DC

## 2013-07-26 MED ORDER — HYDROCODONE-ACETAMINOPHEN 5-325 MG PO TABS: 1.0000 | ORAL_TABLET | Freq: Two times a day (BID) | ORAL | Status: DC | PRN

## 2013-07-26 NOTE — Progress Notes (Signed)
Patient ID: Jonathan Beasley MRN: [id number], DOB: 10/26/1951 62 y.o. Date of Encounter: 07/26/2013, 9:49 AM  Primary Physician: No primary provider on file.  Chief Complaint: Physical (CPE)  HPI: 62 y.o. y/o male with history noted below here for CPE.  Doing well. No issues/complaints.  Review of Systems: Consitutional: No fever, chills, fatigue, night sweats, lymphadenopathy, or weight changes. Eyes: No visual changes, eye redness, or discharge. ENT/Mouth: Ears: No otalgia, tinnitus, hearing loss, discharge. Nose: No congestion, rhinorrhea, sinus pain, or epistaxis. Throat: No sore throat, post nasal drip, or teeth pain. Cardiovascular: No CP, palpitations, diaphoresis, DOE, edema, orthopnea, PND. Respiratory: No cough, hemoptysis, SOB, or wheezing. Gastrointestinal: No anorexia, dysphagia, reflux, pain, nausea, vomiting, hematemesis, diarrhea, constipation, BRBPR, or melena. Genitourinary: No dysuria, frequency, urgency, hematuria, incontinence, nocturia, decreased urinary stream, discharge, impotence, or testicular pain/masses. Musculoskeletal: No decreased ROM, myalgias, stiffness, joint swelling, or weakness.  Swollen left achilles, pain with right knee Skin: No rash, erythema, lesion changes, pain, warmth, jaundice, or pruritis. Neurological: No headache, dizziness, syncope, seizures, memory loss, coordination problems, or paresthesias.  Left hand action tremor Psychological: No anxiety, depression, hallucinations, SI/HI. Endocrine: No fatigue, polydipsia, polyphagia, polyuria, or known diabetes. All other systems were reviewed and are otherwise negative.  Past Medical History  Diagnosis Date  . Hyperlipidemia   . Restless legs   . Arthritis      Past Surgical History  Procedure Laterality Date  . Knee arthroscopy      Home Meds:  Prior to Admission medications   Medication Sig Start Date End Date Taking? Authorizing Provider  cyclobenzaprine (FLEXERIL) 10 MG tablet  Take 1 tablet (10 mg total) by mouth 3 (three) times daily as needed. 07/26/13  Yes Elvina Sidle, MD  HYDROcodone-acetaminophen (NORCO/VICODIN) 5-325 MG per tablet Take 1 tablet by mouth every 12 (twelve) hours as needed for pain. 07/26/13  Yes Elvina Sidle, MD  meloxicam (MOBIC) 15 MG tablet Take 1 tablet (15 mg total) by mouth as needed for pain. 12/11/12  Yes Elvina Sidle, MD  niacin 500 MG tablet Take 500 mg by mouth daily with breakfast.   Yes Historical Provider, MD  rOPINIRole (REQUIP) 1 MG tablet Take 1 tablet (1 mg total) by mouth at bedtime. 07/26/13  Yes Elvina Sidle, MD  rosuvastatin (CRESTOR) 10 MG tablet Take 1 tablet (10 mg total) by mouth daily. 07/26/13  Yes Elvina Sidle, MD  vitamin C (ASCORBIC ACID) 250 MG tablet Take 250 mg by mouth daily.   Yes Historical Provider, MD    Allergies:  Allergies  Allergen Reactions  . Penicillins Other (See Comments)    Childhood- "deathly ill"    History   Social History  . Marital Status: Married    Spouse Name: N/A    Number of Children: N/A  . Years of Education: N/A   Occupational History  . Not on file.   Social History Main Topics  . Smoking status: Never Smoker   . Smokeless tobacco: Not on file  . Alcohol Use: No  . Drug Use: No  . Sexual Activity: Yes   Other Topics Concern  . Not on file   Social History Narrative  . No narrative on file    Family History  Problem Relation Age of Onset  . Heart disease Father   . Heart disease Maternal Grandfather   . Heart disease Paternal Grandfather     Physical Exam: Blood pressure 112/78, pulse 62, temperature 98.1 F (36.7 C), temperature source Oral, resp. rate  16, height 5' 9.75" (1.772 m), weight 186 lb 6.4 oz (84.55 kg), SpO2 95.00%.  General: Well developed, well nourished, in no acute distress. HEENT: Normocephalic, atraumatic. Conjunctiva pink, sclera non-icteric. Pupils 2 mm constricting to 1 mm, round, regular, and equally reactive to light and  accomodation. EOMI. Internal auditory canal clear. TMs with good cone of light and without pathology. Nasal mucosa pink. Nares are without discharge. No sinus tenderness. Oral mucosa pink. Dentition good. Pharynx without exudate.   Neck: Supple. Trachea midline. No thyromegaly. Full ROM. No lymphadenopathy. Lungs: Clear to auscultation bilaterally without wheezes, rales, or rhonchi. Breathing is of normal effort and unlabored. Cardiovascular: RRR with S1 S2. No murmurs, rubs, or gallops appreciated. Distal pulses 2+ symmetrically. No carotid or abdominal bruits Abdomen: Soft, non-tender, non-distended with normoactive bowel sounds. No hepatosplenomegaly or masses. No rebound/guarding. No CVA tenderness. Without hernias.  Rectal: No external hemorrhoids or fissures. Rectal vault without masses.  Genitourinary:  circumcised male. No penile lesions. Testes descended bilaterally, and smooth without tenderness or masses.  Musculoskeletal: Full range of motion and 5/5 strength throughout. Without swelling, atrophy, tenderness, crepitus, or warmth. Extremities without clubbing, cyanosis, or edema. Calves supple.  Marked swelling left achilles Skin: Warm and moist without erythema, ecchymosis, wounds, or rash. Neuro: A+Ox3. CN II-XII grossly intact. Moves all extremities spontaneously. Full sensation throughout. Normal gait. DTR 2+ throughout upper and lower extremities. Finger to nose intact. Psych:  Responds to questions appropriately with a normal affect.   Studies: CBC, CMET, Lipid, PSA, TSH, Vitamin D all pending. UA:   Assessment/Plan:  62 y.o. y/o  male here for CPE Annual physical exam - Plan: CBC, Comprehensive metabolic panel, Lipid panel, TSH, POCT urinalysis dipstick, PSA, Vitamin D, 25-hydroxy, Ambulatory referral to Gastroenterology, IFOBT POC (occult bld, rslt in office)  Restless legs syndrome - Plan: rOPINIRole (REQUIP) 1 MG tablet  Health care maintenance  Knee pain, acute, left -  Plan: cyclobenzaprine (FLEXERIL) 10 MG tablet  Knee pain, right - Plan: HYDROcodone-acetaminophen (NORCO/VICODIN) 5-325 MG per tablet  Achilles tendonitis, left - Plan: HYDROcodone-acetaminophen (NORCO/VICODIN) 5-325 MG per tablet  Hyperlipidemia - Plan: rosuvastatin (CRESTOR) 10 MG tablet   Signed, Elvina Sidle, MD 07/26/2013 9:49 AM

## 2013-07-26 NOTE — Patient Instructions (Signed)
Health Maintenance, Males A healthy lifestyle and preventative care can promote health and wellness.  Maintain regular health, dental, and eye exams.  Eat a healthy diet. Foods like vegetables, fruits, whole grains, low-fat dairy products, and lean protein foods contain the nutrients you need without too many calories. Decrease your intake of foods high in solid fats, added sugars, and salt. Get information about a proper diet from your caregiver, if necessary.  Regular physical exercise is one of the most important things you can do for your health. Most adults should get at least 150 minutes of moderate-intensity exercise (any activity that increases your heart rate and causes you to sweat) each week. In addition, most adults need muscle-strengthening exercises on 2 or more days a week.   Maintain a healthy weight. The body mass index (BMI) is a screening tool to identify possible weight problems. It provides an estimate of body fat based on height and weight. Your caregiver can help determine your BMI, and can help you achieve or maintain a healthy weight. For adults 20 years and older:  A BMI below 18.5 is considered underweight.  A BMI of 18.5 to 24.9 is normal.  A BMI of 25 to 29.9 is considered overweight.  A BMI of 30 and above is considered obese.  Maintain normal blood lipids and cholesterol by exercising and minimizing your intake of saturated fat. Eat a balanced diet with plenty of fruits and vegetables. Blood tests for lipids and cholesterol should begin at age 20 and be repeated every 5 years. If your lipid or cholesterol levels are high, you are over 50, or you are a high risk for heart disease, you may need your cholesterol levels checked more frequently.Ongoing high lipid and cholesterol levels should be treated with medicines, if diet and exercise are not effective.  If you smoke, find out from your caregiver how to quit. If you do not use tobacco, do not start.  If you  choose to drink alcohol, do not exceed 2 drinks per day. One drink is considered to be 12 ounces (355 mL) of beer, 5 ounces (148 mL) of wine, or 1.5 ounces (44 mL) of liquor.  Avoid use of street drugs. Do not share needles with anyone. Ask for help if you need support or instructions about stopping the use of drugs.  High blood pressure causes heart disease and increases the risk of stroke. Blood pressure should be checked at least every 1 to 2 years. Ongoing high blood pressure should be treated with medicines if weight loss and exercise are not effective.  If you are 45 to 61 years old, ask your caregiver if you should take aspirin to prevent heart disease.  Diabetes screening involves taking a blood sample to check your fasting blood sugar level. This should be done once every 3 years, after age 45, if you are within normal weight and without risk factors for diabetes. Testing should be considered at a younger age or be carried out more frequently if you are overweight and have at least 1 risk factor for diabetes.  Colorectal cancer can be detected and often prevented. Most routine colorectal cancer screening begins at the age of 50 and continues through age 75. However, your caregiver may recommend screening at an earlier age if you have risk factors for colon cancer. On a yearly basis, your caregiver may provide home test kits to check for hidden blood in the stool. Use of a small camera at the end of a tube,   to directly examine the colon (sigmoidoscopy or colonoscopy), can detect the earliest forms of colorectal cancer. Talk to your caregiver about this at age 50, when routine screening begins. Direct examination of the colon should be repeated every 5 to 10 years through age 75, unless early forms of pre-cancerous polyps or small growths are found.  Hepatitis C blood testing is recommended for all people born from 1945 through 1965 and any individual with known risks for hepatitis C.  Healthy  men should no longer receive prostate-specific antigen (PSA) blood tests as part of routine cancer screening. Consult with your caregiver about prostate cancer screening.  Testicular cancer screening is not recommended for adolescents or adult males who have no symptoms. Screening includes self-exam, caregiver exam, and other screening tests. Consult with your caregiver about any symptoms you have or any concerns you have about testicular cancer.  Practice safe sex. Use condoms and avoid high-risk sexual practices to reduce the spread of sexually transmitted infections (STIs).  Use sunscreen with a sun protection factor (SPF) of 30 or greater. Apply sunscreen liberally and repeatedly throughout the day. You should seek shade when your shadow is shorter than you. Protect yourself by wearing long sleeves, pants, a wide-brimmed hat, and sunglasses year round, whenever you are outdoors.  Notify your caregiver of new moles or changes in moles, especially if there is a change in shape or color. Also notify your caregiver if a mole is larger than the size of a pencil eraser.  A one-time screening for abdominal aortic aneurysm (AAA) and surgical repair of large AAAs by sound wave imaging (ultrasonography) is recommended for ages 65 to 75 years who are current or former smokers.  Stay current with your immunizations. Document Released: 04/29/2008 Document Revised: 01/24/2012 Document Reviewed: 03/29/2011 [employer] Patient Information 2014 [employer].  

## 2013-07-27 LAB — VITAMIN D 25 HYDROXY (VIT D DEFICIENCY, FRACTURES): Vit D, 25-Hydroxy: 31 ng/mL (ref 30–89)

## 2013-09-20 ENCOUNTER — Other Ambulatory Visit: Payer: Self-pay

## 2013-09-30 ENCOUNTER — Telehealth: Payer: Self-pay

## 2013-09-30 DIAGNOSIS — M25561 Pain in right knee: Secondary | ICD-10-CM

## 2013-09-30 DIAGNOSIS — M7662 Achilles tendinitis, left leg: Secondary | ICD-10-CM

## 2013-09-30 NOTE — Telephone Encounter (Signed)
Patient would like refill of HYDROcodone-acetaminophen (NORCO/VICODIN) 5-325 MG per tablet [[id number]]  Best # [phone]

## 2013-10-01 MED ORDER — HYDROCODONE-ACETAMINOPHEN 5-325 MG PO TABS: 1.0000 | ORAL_TABLET | Freq: Two times a day (BID) | ORAL | Status: DC | PRN

## 2013-10-08 ENCOUNTER — Other Ambulatory Visit: Payer: Self-pay | Admitting: Family Medicine

## 2013-10-08 DIAGNOSIS — M7662 Achilles tendinitis, left leg: Secondary | ICD-10-CM

## 2013-10-08 DIAGNOSIS — M25561 Pain in right knee: Secondary | ICD-10-CM

## 2013-10-08 MED ORDER — HYDROCODONE-ACETAMINOPHEN 5-325 MG PO TABS: 1.0000 | ORAL_TABLET | Freq: Two times a day (BID) | ORAL | Status: DC | PRN

## 2014-04-14 ENCOUNTER — Ambulatory Visit (INDEPENDENT_AMBULATORY_CARE_PROVIDER_SITE_OTHER): Payer: 59 | Admitting: Family Medicine

## 2014-04-14 ENCOUNTER — Ambulatory Visit: Payer: 59

## 2014-04-14 VITALS — BP 130/80 | HR 68 | Temp 98.1°F | Ht 69.5 in | Wt 190.0 lb

## 2014-04-14 DIAGNOSIS — M25559 Pain in unspecified hip: Secondary | ICD-10-CM

## 2014-04-14 DIAGNOSIS — M25551 Pain in right hip: Secondary | ICD-10-CM

## 2014-04-14 DIAGNOSIS — Z1211 Encounter for screening for malignant neoplasm of colon: Secondary | ICD-10-CM

## 2014-04-14 MED ORDER — HYDROCODONE-ACETAMINOPHEN 7.5-325 MG PO TABS: 1.0000 | ORAL_TABLET | Freq: Four times a day (QID) | ORAL | Status: DC | PRN

## 2014-04-14 MED ORDER — PREDNISONE 20 MG PO TABS: ORAL_TABLET | ORAL | Status: DC

## 2014-04-14 NOTE — Progress Notes (Signed)
Subjective:    Patient ID: Jonathan Beasley, male    DOB: 1951-10-09, 63 y.o.   MRN: [id number]  HPI Chief Complaint  Patient presents with   slip and hip injury    x 4 days   This chart was scribed for Robyn Haber, MD by Thea Alken, ED Scribe. This patient was seen in room 4 and the patient's care was started at 11:26 AM.  HPI Comments: Jonathan Beasley is a 63 y.o. male who presents to the [facility] complaining of right hip pain onset 4 days ago. Pt reports he was descending a ladder and slipped backward on right leg.  Pt reports pain with walking and certain movement. Pt has taken 3 Vicodin at once for the pain. Pt has also taken flexeril and meloxicam that he had left over. Pt reports pain medication helped him sleep. Pt son Jonathan Beasley is a physical therapist who has given pt stretches to help with the pain. Pt reports he is unable to stand for a long period time. Pt reports he rested the entire day 1 days ago which was the only reason he was able to walk today.   Past Medical History  Diagnosis Date   Hyperlipidemia    Restless legs    Arthritis    Allergies  Allergen Reactions   Penicillins Other (See Comments)    Childhood- "deathly ill"   Prior to Admission medications   Medication Sig Start Date End Date Taking? Authorizing Provider  cyclobenzaprine (FLEXERIL) 10 MG tablet Take 1 tablet (10 mg total) by mouth 3 (three) times daily as needed. 07/26/13  Yes Robyn Haber, MD  meloxicam (MOBIC) 15 MG tablet Take 1 tablet (15 mg total) by mouth as needed for pain. 07/26/13  Yes Robyn Haber, MD  rOPINIRole (REQUIP) 1 MG tablet Take 1 tablet (1 mg total) by mouth at bedtime. 07/26/13  Yes Robyn Haber, MD  rosuvastatin (CRESTOR) 10 MG tablet Take 1 tablet (10 mg total) by mouth daily. 07/26/13  Yes Robyn Haber, MD   Review of Systems  Musculoskeletal: Positive for arthralgias and gait problem.      Objective:   Physical Exam  Nursing note  and vitals reviewed. Constitutional: He is oriented to person, place, and time. He appears well-developed and well-nourished. No distress.  HENT:  Head: Normocephalic and atraumatic.  Eyes: EOM are normal.  Neck: Neck supple. No tracheal deviation present.  Cardiovascular: Normal rate.   Pulmonary/Chest: Effort normal. No respiratory distress.  Musculoskeletal: Normal range of motion.  Pain with forced internal rotation  Neurological: He is alert and oriented to person, place, and time.  Skin: Skin is warm and dry.  Psychiatric: He has a normal mood and affect. His behavior is normal.   [facility] reading (PRIMARY) done by Dr. Joseph Art- relatively normal exam for age. Suspicious linear hypodensity at lateral aspect of the right acetabulum      Assessment & Plan:   1. Right hip pain   2. Special screening for malignant neoplasms, colon    Meds ordered this encounter  Medications   predniSONE (DELTASONE) 20 MG tablet    Sig: 2 daily with food    Dispense:  10 tablet    Refill:  1   HYDROcodone-acetaminophen (NORCO) 7.5-325 MG per tablet    Sig: Take 1 tablet by mouth every 6 (six) hours as needed for moderate pain.    Dispense:  30 tablet    Refill:  0   Colonoscopy  referral  Robyn Haber, MD

## 2014-04-22 ENCOUNTER — Telehealth: Payer: Self-pay

## 2014-04-22 NOTE — Telephone Encounter (Signed)
Advised pt of normal Xray

## 2014-04-22 NOTE — Telephone Encounter (Signed)
Patient called to ask for radiology results on hip. He says Dr. Carlean Jews was going to show his xrays to another Dr. Please advise. Seen 05/31 Thank you! Best: [phone]

## 2014-04-26 ENCOUNTER — Other Ambulatory Visit: Payer: Self-pay | Admitting: Family Medicine

## 2014-04-26 NOTE — Telephone Encounter (Signed)
Dr L, you recently saw pt, but not for this med. OK to RF?

## 2014-04-29 ENCOUNTER — Ambulatory Visit: Payer: 59 | Admitting: Family Medicine

## 2014-05-09 ENCOUNTER — Telehealth: Payer: Self-pay

## 2014-05-09 NOTE — Telephone Encounter (Signed)
Patient would like to know if Dr Joseph Art could help him refill his Crestor.  The pharmacy is given him a hard time in trying to get this refilled.   Best#: [phone]

## 2014-05-10 ENCOUNTER — Other Ambulatory Visit: Payer: Self-pay | Admitting: Family Medicine

## 2014-05-10 ENCOUNTER — Telehealth: Payer: Self-pay | Admitting: Family Medicine

## 2014-05-10 DIAGNOSIS — M25559 Pain in unspecified hip: Secondary | ICD-10-CM

## 2014-05-10 DIAGNOSIS — E785 Hyperlipidemia, unspecified: Secondary | ICD-10-CM

## 2014-05-10 MED ORDER — ROSUVASTATIN CALCIUM 10 MG PO TABS: 10.0000 mg | ORAL_TABLET | Freq: Every day | ORAL | Status: DC

## 2014-05-10 MED ORDER — TRAMADOL HCL 50 MG PO TABS: 50.0000 mg | ORAL_TABLET | Freq: Three times a day (TID) | ORAL | Status: DC | PRN

## 2014-05-10 NOTE — Telephone Encounter (Signed)
Spoke to pharmacy- they need a PA  Pharmacy sent to the wrong fax number. Corrected fax and they are sending over again.

## 2014-05-10 NOTE — Telephone Encounter (Signed)
Pt is requesting a refill for tramodol.   Best: [phone]

## 2014-05-10 NOTE — Telephone Encounter (Signed)
This pt has an appointment for 07/04/14 Can you refill his crestor until this time?

## 2014-05-13 ENCOUNTER — Encounter: Payer: Self-pay | Admitting: *Deleted

## 2014-05-13 DIAGNOSIS — E785 Hyperlipidemia, unspecified: Secondary | ICD-10-CM | POA: Insufficient documentation

## 2014-05-13 NOTE — Telephone Encounter (Signed)
Sent PA to [facility] for crestor.

## 2014-06-27 ENCOUNTER — Encounter: Payer: 59 | Admitting: Family Medicine

## 2014-07-04 ENCOUNTER — Encounter: Payer: Self-pay | Admitting: Family Medicine

## 2014-07-04 ENCOUNTER — Ambulatory Visit (INDEPENDENT_AMBULATORY_CARE_PROVIDER_SITE_OTHER): Payer: 59 | Admitting: Family Medicine

## 2014-07-04 VITALS — BP 130/80 | HR 62 | Temp 97.8°F | Resp 16 | Ht 69.5 in | Wt 191.8 lb

## 2014-07-04 DIAGNOSIS — IMO0001 Reserved for inherently not codable concepts without codable children: Secondary | ICD-10-CM

## 2014-07-04 DIAGNOSIS — M791 Myalgia, unspecified site: Secondary | ICD-10-CM

## 2014-07-04 DIAGNOSIS — M25551 Pain in right hip: Secondary | ICD-10-CM

## 2014-07-04 DIAGNOSIS — M25559 Pain in unspecified hip: Secondary | ICD-10-CM

## 2014-07-04 DIAGNOSIS — Z8249 Family history of ischemic heart disease and other diseases of the circulatory system: Secondary | ICD-10-CM

## 2014-07-04 DIAGNOSIS — M25561 Pain in right knee: Secondary | ICD-10-CM

## 2014-07-04 DIAGNOSIS — M25569 Pain in unspecified knee: Secondary | ICD-10-CM

## 2014-07-04 DIAGNOSIS — E785 Hyperlipidemia, unspecified: Secondary | ICD-10-CM

## 2014-07-04 DIAGNOSIS — Z1211 Encounter for screening for malignant neoplasm of colon: Secondary | ICD-10-CM

## 2014-07-04 DIAGNOSIS — R748 Abnormal levels of other serum enzymes: Secondary | ICD-10-CM

## 2014-07-04 DIAGNOSIS — Z0000 Encounter for general adult medical examination without abnormal findings: Secondary | ICD-10-CM

## 2014-07-04 DIAGNOSIS — M25562 Pain in left knee: Secondary | ICD-10-CM

## 2014-07-04 DIAGNOSIS — G2581 Restless legs syndrome: Secondary | ICD-10-CM

## 2014-07-04 LAB — IFOBT (OCCULT BLOOD): IFOBT: NEGATIVE

## 2014-07-04 LAB — LIPID PANEL
Cholesterol: 217 mg/dL — ABNORMAL HIGH (ref 0–200)
HDL: 38 mg/dL — ABNORMAL LOW (ref >39)
LDL Cholesterol: 127 mg/dL — ABNORMAL HIGH (ref 0–99)
Total CHOL/HDL Ratio: 5.7 Ratio
Triglycerides: 258 mg/dL — ABNORMAL HIGH (ref <150)
VLDL: 52 mg/dL — ABNORMAL HIGH (ref 0–40)

## 2014-07-04 LAB — POCT URINALYSIS DIPSTICK
Blood, UA: NEGATIVE
Glucose, UA: NEGATIVE
Ketones, UA: NEGATIVE
Leukocytes, UA: NEGATIVE
Nitrite, UA: NEGATIVE
Protein, UA: NEGATIVE
Spec Grav, UA: 1.015
Urobilinogen, UA: 0.2
pH, UA: 5.5

## 2014-07-04 LAB — CBC WITH DIFFERENTIAL/PLATELET
Basophils Absolute: 0 10*3/uL (ref 0.0–0.1)
Basophils Relative: 0 % (ref 0–1)
Eosinophils Absolute: 0.4 10*3/uL (ref 0.0–0.7)
Eosinophils Relative: 6 % — ABNORMAL HIGH (ref 0–5)
HCT: 45.6 % (ref 39.0–52.0)
Hemoglobin: 15.7 g/dL (ref 13.0–17.0)
Lymphocytes Relative: 37 % (ref 12–46)
Lymphs Abs: 2.2 10*3/uL (ref 0.7–4.0)
MCH: 29.4 pg (ref 26.0–34.0)
MCHC: 34.4 g/dL (ref 30.0–36.0)
MCV: 85.4 fL (ref 78.0–100.0)
Monocytes Absolute: 0.5 10*3/uL (ref 0.1–1.0)
Monocytes Relative: 9 % (ref 3–12)
Neutro Abs: 2.8 10*3/uL (ref 1.7–7.7)
Neutrophils Relative %: 48 % (ref 43–77)
Platelets: 216 10*3/uL (ref 150–400)
RBC: 5.34 MIL/uL (ref 4.22–5.81)
RDW: 13.2 % (ref 11.5–15.5)
WBC: 5.9 10*3/uL (ref 4.0–10.5)

## 2014-07-04 LAB — COMPREHENSIVE METABOLIC PANEL
ALT: 25 U/L (ref 0–53)
AST: 32 U/L (ref 0–37)
Albumin: 4.3 g/dL (ref 3.5–5.2)
Alkaline Phosphatase: 56 U/L (ref 39–117)
BUN: 14 mg/dL (ref 6–23)
CO2: 26 mEq/L (ref 19–32)
Calcium: 9.3 mg/dL (ref 8.4–10.5)
Chloride: 104 mEq/L (ref 96–112)
Creat: 0.89 mg/dL (ref 0.50–1.35)
Glucose, Bld: 97 mg/dL (ref 70–99)
Potassium: 4.5 mEq/L (ref 3.5–5.3)
Sodium: 137 mEq/L (ref 135–145)
Total Bilirubin: 0.6 mg/dL (ref 0.2–1.2)
Total Protein: 7.9 g/dL (ref 6.0–8.3)

## 2014-07-04 LAB — CK: Total CK: 354 U/L — ABNORMAL HIGH (ref 7–232)

## 2014-07-04 MED ORDER — MELOXICAM 15 MG PO TABS: 15.0000 mg | ORAL_TABLET | ORAL | Status: DC | PRN

## 2014-07-04 MED ORDER — CYCLOBENZAPRINE HCL 10 MG PO TABS: 10.0000 mg | ORAL_TABLET | Freq: Three times a day (TID) | ORAL | Status: DC | PRN

## 2014-07-04 MED ORDER — ROPINIROLE HCL 1 MG PO TABS: 1.0000 mg | ORAL_TABLET | Freq: Every day | ORAL | Status: DC

## 2014-07-04 MED ORDER — HYDROCODONE-ACETAMINOPHEN 7.5-325 MG PO TABS: 1.0000 | ORAL_TABLET | Freq: Four times a day (QID) | ORAL | Status: DC | PRN

## 2014-07-04 NOTE — Progress Notes (Signed)
This chart was scribed for Robyn Haber, MD by Ladene Artist, ED Scribe. The patient was seen in room 24. Patient's care was started at 8:23 AM.  Patient ID: Jonathan Beasley MRN: [id number], DOB: November 19, 1950, 63 y.o. Date of Encounter: 07/04/2014, 8:23 AM  Primary Physician: No primary provider on file.  Chief Complaint: Annual Exam & Medication Refill  HPI: 63 y.o. year old male with history below presents for an annual exam. Pt states that he has been doing well overall. Pt states that he has only taken Niacin and baby ASA for the past 30 days with no side effects. Pt has not taken fish oil yet; he wants to have blood work done first. Pt also takes 1 Vicodin tablet approximately once every 10 days for pain. He also states that he follows up with a physician in [place] for his heel pain; plans to see them annually. Pt reports pain whether he runs or not. Pain is worse with walking than it is with jumping.   Pt reports family history of MI for multiple men in their 18's. Pt has had stress test done by Dr. Caryl Comes within the past 2 years.  Pt states that his son is working with the [employer] system in [place]. Pt states that his daughter has gained a lot of weight although she walks often. She is still looking for a cure for her IBS.   Past Medical History  Diagnosis Date  . Hyperlipidemia   . Restless legs   . Arthritis      Home Meds: Prior to Admission medications   Medication Sig Start Date End Date Taking? Authorizing Provider  cyclobenzaprine (FLEXERIL) 10 MG tablet Take 1 tablet (10 mg total) by mouth 3 (three) times daily as needed. 07/26/13  Yes Robyn Haber, MD  HYDROcodone-acetaminophen ([place]) 7.5-325 MG per tablet Take 1 tablet by mouth every 6 (six) hours as needed for moderate pain. 04/14/14  Yes Robyn Haber, MD  meloxicam (MOBIC) 15 MG tablet Take 1 tablet (15 mg total) by mouth as needed for pain. 07/26/13  Yes Robyn Haber, MD  rOPINIRole (REQUIP) 1 MG tablet TAKE 1  TABLET (1 MG TOTAL) BY MOUTH AT BEDTIME. 04/26/14  Yes Robyn Haber, MD  rosuvastatin (CRESTOR) 10 MG tablet Take 1 tablet (10 mg total) by mouth daily. 05/10/14  Yes Robyn Haber, MD  predniSONE (DELTASONE) 20 MG tablet 2 daily with food 04/14/14   Robyn Haber, MD  traMADol (ULTRAM) 50 MG tablet Take 1 tablet (50 mg total) by mouth every 8 (eight) hours as needed. 05/10/14   Robyn Haber, MD    Allergies:  Allergies  Allergen Reactions  . Penicillins Other (See Comments)    Childhood- "deathly ill"    History   Social History  . Marital Status: Married    Spouse Name: N/A    Number of Children: N/A  . Years of Education: N/A   Occupational History  . Not on file.   Social History Main Topics  . Smoking status: Never Smoker   . Smokeless tobacco: Not on file  . Alcohol Use: No  . Drug Use: No  . Sexual Activity: Yes   Other Topics Concern  . Not on file   Social History Narrative   Married   Secretary/administrator   Works in Secretary/administrator           Review of Systems: Constitutional: negative for chills, fever, night sweats, weight changes, or fatigue  HEENT: negative for vision changes,  hearing loss, congestion, rhinorrhea, ST, epistaxis, or sinus pressure Cardiovascular: negative for chest pain or palpitations Respiratory: negative for hemoptysis, wheezing, shortness of breath, or cough Abdominal: negative for abdominal pain, nausea, vomiting, diarrhea, or constipation Dermatological: negative for rash Neurologic: negative for headache, dizziness, or syncope Musculoskeletal: +arthralgias, +myalgias All other systems reviewed and are otherwise negative with the exception to those above and in the HPI.   Physical Exam: Blood pressure 130/80, pulse 62, temperature 97.8 F (36.6 C), temperature source Oral, resp. rate 16, height 5' 9.5" (1.765 m), weight 191 lb 12.8 oz (87 kg), SpO2 96.00%., Body mass index is 27.93 kg/(m^2). General: Well developed, well  nourished, in no acute distress. Head: Normocephalic, atraumatic, eyes without discharge, sclera non-icteric, nares are without discharge. Bilateral auditory canals clear, TM's are without perforation, pearly grey and translucent with reflective cone of light bilaterally. Oral cavity moist, posterior pharynx without exudate, erythema, peritonsillar abscess, or post nasal drip.  Neck: Supple. No thyromegaly. Full ROM. No lymphadenopathy. Lungs: Clear bilaterally to auscultation without wheezes, rales, or rhonchi. Breathing is unlabored. Heart: RRR with S1 S2. No murmurs, rubs, or gallops appreciated. Abdomen: Soft, non-tender, non-distended with normoactive bowel sounds. No hepatomegaly. No rebound/guarding. No obvious abdominal masses. Msk:  Strength and tone normal for age. GU: normal genitalia, normal rectal Extremities/Skin: Warm and dry. No clubbing or cyanosis. No edema. No rashes or suspicious lesions. Neuro: Alert and oriented X 3. Moves all extremities spontaneously. Gait is normal. CNII-XII grossly in tact. Psych:  Responds to questions appropriately with a normal affect.     ASSESSMENT AND PLAN:  63 y.o. year old male with hyperlipidemia and strong F/H CAD.  He has persistent elevated CK and I do not believe he should be taking statins.  He is taking just Niacin 400 mg slow release and baby aspirin.  He is overdue for a colonoscopy.  Overall, he is a fitness fanatic and I don't think aggressive lipid management is in his best interest.  Rather, periodic cardiac testing seems more appopriate.  1. Routine general medical examination at a health care facility   2. Other and unspecified hyperlipidemia   3. Myalgia   4. Abnormal CPK   5. Family history of early CAD   37. Right hip pain   7. Knee pain, acute, left   8. Knee pain, right    Signed, Robyn Haber, MD 07/04/2014 9:07 AM

## 2014-07-04 NOTE — Progress Notes (Signed)
   Subjective:    Patient ID: Jonathan Beasley, male    DOB: February 24, 1951, 63 y.o.   MRN: [id number]  HPI    Review of Systems  Constitutional: Negative.   HENT: Negative.   Eyes: Negative.   Respiratory: Positive for wheezing.   Cardiovascular: Negative.   Gastrointestinal: Negative.   Endocrine: Negative.   Genitourinary: Negative.   Musculoskeletal: Negative.   Skin: Negative.   Allergic/Immunologic: Negative.   Neurological: Negative.   Hematological: Negative.   Psychiatric/Behavioral: Negative.        Objective:   Physical Exam        Assessment & Plan:

## 2014-07-04 NOTE — Patient Instructions (Signed)

## 2014-07-05 LAB — PSA: PSA: 0.61 ng/mL (ref <=4.00)

## 2014-07-29 ENCOUNTER — Encounter: Payer: 59 | Admitting: Family Medicine

## 2014-08-09 ENCOUNTER — Telehealth: Payer: Self-pay | Admitting: Cardiovascular Disease

## 2014-08-09 NOTE — Telephone Encounter (Signed)
Closed encounters °

## 2014-09-18 ENCOUNTER — Ambulatory Visit: Payer: 59 | Admitting: Cardiovascular Disease

## 2014-10-04 NOTE — Telephone Encounter (Signed)
Never heard back from ins, but pt was taken off this med at 07/04/14 OV.

## 2014-12-12 ENCOUNTER — Ambulatory Visit: Payer: 59 | Admitting: Cardiovascular Disease

## 2015-04-10 ENCOUNTER — Ambulatory Visit (INDEPENDENT_AMBULATORY_CARE_PROVIDER_SITE_OTHER): Payer: 59 | Admitting: Family Medicine

## 2015-04-10 ENCOUNTER — Encounter: Payer: Self-pay | Admitting: Family Medicine

## 2015-04-10 VITALS — BP 142/84 | HR 77 | Temp 98.3°F | Resp 16 | Ht 69.5 in | Wt 187.2 lb

## 2015-04-10 DIAGNOSIS — E785 Hyperlipidemia, unspecified: Secondary | ICD-10-CM | POA: Diagnosis not present

## 2015-04-10 DIAGNOSIS — M25551 Pain in right hip: Secondary | ICD-10-CM | POA: Diagnosis not present

## 2015-04-10 DIAGNOSIS — M25561 Pain in right knee: Secondary | ICD-10-CM | POA: Diagnosis not present

## 2015-04-10 DIAGNOSIS — G2581 Restless legs syndrome: Secondary | ICD-10-CM | POA: Diagnosis not present

## 2015-04-10 DIAGNOSIS — S39012A Strain of muscle, fascia and tendon of lower back, initial encounter: Secondary | ICD-10-CM

## 2015-04-10 DIAGNOSIS — M25562 Pain in left knee: Secondary | ICD-10-CM

## 2015-04-10 DIAGNOSIS — Z0000 Encounter for general adult medical examination without abnormal findings: Secondary | ICD-10-CM | POA: Diagnosis not present

## 2015-04-10 LAB — LIPID PANEL
Cholesterol: 220 mg/dL — ABNORMAL HIGH (ref 0–200)
HDL: 37 mg/dL — ABNORMAL LOW (ref >=40)
LDL Cholesterol: 148 mg/dL — ABNORMAL HIGH (ref 0–99)
Total CHOL/HDL Ratio: 5.9 Ratio
Triglycerides: 174 mg/dL — ABNORMAL HIGH (ref <150)
VLDL: 35 mg/dL (ref 0–40)

## 2015-04-10 LAB — COMPLETE METABOLIC PANEL WITH GFR
ALT: 21 U/L (ref 0–53)
AST: 33 U/L (ref 0–37)
Albumin: 4.5 g/dL (ref 3.5–5.2)
Alkaline Phosphatase: 62 U/L (ref 39–117)
BUN: 17 mg/dL (ref 6–23)
CO2: 25 mEq/L (ref 19–32)
Calcium: 9.5 mg/dL (ref 8.4–10.5)
Chloride: 102 mEq/L (ref 96–112)
Creat: 0.87 mg/dL (ref 0.50–1.35)
GFR, Est African American: >89 mL/min
GFR, Est Non African American: >89 mL/min
Glucose, Bld: 99 mg/dL (ref 70–99)
Potassium: 4.7 mEq/L (ref 3.5–5.3)
Sodium: 138 mEq/L (ref 135–145)
Total Bilirubin: 0.5 mg/dL (ref 0.2–1.2)
Total Protein: 8.1 g/dL (ref 6.0–8.3)

## 2015-04-10 LAB — POCT URINALYSIS DIPSTICK
Bilirubin, UA: NEGATIVE
Blood, UA: NEGATIVE
Glucose, UA: NEGATIVE
Ketones, UA: NEGATIVE
Leukocytes, UA: NEGATIVE
Nitrite, UA: NEGATIVE
Protein, UA: 30
Spec Grav, UA: 1.02
Urobilinogen, UA: 0.2
pH, UA: 5.5

## 2015-04-10 LAB — CBC WITH DIFFERENTIAL/PLATELET
Basophils Absolute: 0 10*3/uL (ref 0.0–0.1)
Basophils Relative: 0 % (ref 0–1)
Eosinophils Absolute: 0.6 10*3/uL (ref 0.0–0.7)
Eosinophils Relative: 9 % — ABNORMAL HIGH (ref 0–5)
HCT: 46.3 % (ref 39.0–52.0)
Hemoglobin: 15.8 g/dL (ref 13.0–17.0)
Lymphocytes Relative: 38 % (ref 12–46)
Lymphs Abs: 2.4 10*3/uL (ref 0.7–4.0)
MCH: 29.3 pg (ref 26.0–34.0)
MCHC: 34.1 g/dL (ref 30.0–36.0)
MCV: 85.7 fL (ref 78.0–100.0)
MPV: 10.1 fL (ref 8.6–12.4)
Monocytes Absolute: 0.5 10*3/uL (ref 0.1–1.0)
Monocytes Relative: 8 % (ref 3–12)
Neutro Abs: 2.8 10*3/uL (ref 1.7–7.7)
Neutrophils Relative %: 45 % (ref 43–77)
Platelets: 260 10*3/uL (ref 150–400)
RBC: 5.4 MIL/uL (ref 4.22–5.81)
RDW: 13.1 % (ref 11.5–15.5)
WBC: 6.3 10*3/uL (ref 4.0–10.5)

## 2015-04-10 LAB — POCT UA - MICROSCOPIC ONLY
Casts, Ur, LPF, POC: NEGATIVE
Crystals, Ur, HPF, POC: NEGATIVE
Epithelial cells, urine per micros: NEGATIVE
Mucus, UA: NEGATIVE
WBC, Ur, HPF, POC: NEGATIVE

## 2015-04-10 LAB — TSH: TSH: 1.347 u[IU]/mL (ref 0.350–4.500)

## 2015-04-10 LAB — IFOBT (OCCULT BLOOD): IFOBT: NEGATIVE

## 2015-04-10 MED ORDER — ROPINIROLE HCL 1 MG PO TABS: 1.0000 mg | ORAL_TABLET | Freq: Every day | ORAL | Status: DC

## 2015-04-10 MED ORDER — MELOXICAM 15 MG PO TABS: 15.0000 mg | ORAL_TABLET | ORAL | Status: DC | PRN

## 2015-04-10 MED ORDER — HYDROCODONE-ACETAMINOPHEN 7.5-325 MG PO TABS: 1.0000 | ORAL_TABLET | Freq: Four times a day (QID) | ORAL | Status: DC | PRN

## 2015-04-10 MED ORDER — NIACIN 500 MG PO TABS: 500.0000 mg | ORAL_TABLET | Freq: Every day | ORAL | Status: DC

## 2015-04-10 MED ORDER — CYCLOBENZAPRINE HCL 10 MG PO TABS: 10.0000 mg | ORAL_TABLET | Freq: Three times a day (TID) | ORAL | Status: DC | PRN

## 2015-04-10 NOTE — Patient Instructions (Signed)

## 2015-04-10 NOTE — Progress Notes (Signed)
   Subjective:    Patient ID: Jonathan Beasley, male    DOB: 01-02-1951, 64 y.o.   MRN: [id number]  HPI    Review of Systems  Constitutional: Negative.   HENT: Negative.   Eyes: Negative.   Respiratory: Negative.   Cardiovascular: Negative.   Gastrointestinal: Negative.   Endocrine: Negative.   Genitourinary: Negative.   Musculoskeletal: Negative.   Skin: Negative.   Allergic/Immunologic: Negative.   Neurological: Negative.   Hematological: Negative.   Psychiatric/Behavioral: Negative.        Objective:   Physical Exam        Assessment & Plan:  See note below

## 2015-04-10 NOTE — Progress Notes (Addendum)
Subjective:    Patient ID: Jonathan Beasley, male    DOB: [birth year], 64 y.o.   MRN: [id number] This chart was scribed for Robyn Haber, MD by Zola Button, Medical Scribe. This patient was seen in Room 25 and the patient's care was started at 10:25 AM.   HPI HPI Comments: Jonathan Beasley is a 64 y.o. male with a hx of achilles tendonitis and HLD who presents to the [facility] for a complete physical exam. He did not end up seeing Dr. Sallyanne Kuster yet; he has been talking to other people about cardiology groups and wants to do more research before deciding on a cardiologist. He has cut down on running. Patient had a wisdom tooth extracted from the right side of his mouth. He has not done his colonoscopy, but he plans to see a GI specialist that his daughter sees in [place].  Patient has occasional flare-ups of right heel pain for which he takes Vicodin for.  Patient reports having some left lower back pain, which he attributes to exertion/moving things.  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  Review of Systems  13 point ROS reviewed on patient health survey. Negative other than listed above or in nursing note. See nursing note.      Objective:   Physical Exam CONSTITUTIONAL: Well developed/well nourished HEAD: Normocephalic/atraumatic EYES: EOM/PERRL, normal fundi ENMT: Mucous membranes moist, no obvious dental, tongue, pharyngeal abnormalities NECK: supple no meningeal signs, no bruit, thyromegaly SPINE: Left lower back tenderness and soreness. Mild straightening of lumbar spine, no scoliosis CV: S1/S2 noted, no murmurs/rubs/gallops noted, LUNGS: Lungs are clear to auscultation bilaterally, no apparent distress ABDOMEN: soft, nontender, no rebound or guarding, early umbilical hernia, no inguinal adenop or hernia GU: no cva tenderness, tender left superior iliac crest NEURO: Pt is  awake/alert, moves all extremitiesx4. Fine tremor. EXTREMITIES: pulses normal, full ROM. Mildly swollen lower achilles tendon on the right. SKIN: warm, color normal PSYCH: no abnormalities of mood noted Rectal:  Smooth, mildly enlarged prostate Results for orders placed or performed in visit on 04/10/15  POCT UA - Microscopic Only  Result Value Ref Range   WBC, Ur, HPF, POC Negative    RBC, urine, microscopic 2-3    Bacteria, U Microscopic Trace    Mucus, UA Negative    Epithelial cells, urine per micros Negative    Crystals, Ur, HPF, POC Negative    Casts, Ur, LPF, POC negative    Yeast, UA    POCT urinalysis dipstick  Result Value Ref Range   Color, UA Yellow    Clarity, UA Clear    Glucose, UA Negative    Bilirubin, UA Negative    Ketones, UA Negative    Spec Grav, UA 1.020    Blood, UA Negative    pH, UA 5.5    Protein, UA 30    Urobilinogen, UA 0.2    Nitrite, UA Negative    Leukocytes, UA Negative   IFOBT POC (occult bld, rslt in office)  Result Value Ref Range   IFOBT Negative         Assessment & Plan:  Patient will find out the name of the GI specialist he wants to see for his colonoscopy and will call me back.  This chart was scribed in my presence and reviewed by me personally.    ICD-9-CM ICD-10-CM   1. Annual  physical exam V70.0 Z00.00 CBC with Differential/Platelet     COMPLETE METABOLIC PANEL WITH GFR     Lipid panel     POCT UA - Microscopic Only     POCT urinalysis dipstick     PSA     IFOBT POC (occult bld, rslt in office)     TSH     CANCELED: POCT SEDIMENTATION RATE  2. Restless legs syndrome 333.94 G25.81 rOPINIRole (REQUIP) 1 MG tablet     Sedimentation rate  3. Right hip pain 719.45 M25.551 HYDROcodone-acetaminophen (NORCO) 7.5-325 MG per tablet  4. Knee pain, acute, left 719.46 M25.562 cyclobenzaprine (FLEXERIL) 10 MG tablet     Sedimentation rate  5. Hyperlipidemia 272.4 E78.5 niacin 500 MG tablet  6. Knee pain, right 719.46 M25.561  meloxicam (MOBIC) 15 MG tablet     Sedimentation rate  7. Low back strain, initial encounter 847.2 S39.012A    Overall patient is doing well. He still has the problem with the Achilles tendinitis when he gets overly active.   Signed, Robyn Haber, MD

## 2015-04-11 LAB — PSA: PSA: 0.86 ng/mL (ref <=4.00)

## 2015-04-11 LAB — SEDIMENTATION RATE: Sed Rate: 5 mm/hr (ref 0–20)

## 2015-04-20 ENCOUNTER — Other Ambulatory Visit: Payer: Self-pay | Admitting: Family Medicine

## 2015-11-05 ENCOUNTER — Ambulatory Visit (INDEPENDENT_AMBULATORY_CARE_PROVIDER_SITE_OTHER): Payer: 59 | Admitting: Family Medicine

## 2015-11-05 VITALS — BP 130/80 | HR 87 | Temp 98.4°F | Resp 20 | Ht 70.0 in | Wt 190.6 lb

## 2015-11-05 DIAGNOSIS — J218 Acute bronchiolitis due to other specified organisms: Secondary | ICD-10-CM

## 2015-11-05 MED ORDER — AZITHROMYCIN 500 MG PO TABS
500.0000 mg | ORAL_TABLET | Freq: Every day | ORAL | Status: DC
Start: 2015-11-05 — End: 2016-03-03

## 2015-11-05 MED ORDER — HYDROCOD POLST-CPM POLST ER 10-8 MG/5ML PO SUER: 5.0000 mL | Freq: Two times a day (BID) | ORAL | Status: DC | PRN

## 2015-11-05 NOTE — Patient Instructions (Signed)

## 2015-11-05 NOTE — Progress Notes (Signed)
@UMFCLOGO @  By signing my name below, I, Raven Small, attest that this documentation has been prepared under the direction and in the presence of Robyn Haber, MD.  Electronically Signed: Thea Alken, ED Scribe. 11/05/2015. 7:49 PM.   Patient ID: Jonathan Beasley MRN: [id number], DOB: 06-21-1951, 64 y.o. Date of Encounter: 11/05/2015, 7:49 PM  Primary Physician: No PCP Per Patient  Chief Complaint:  Chief Complaint  Patient presents with  . OTHER    chest congestion, yello, green plegm,     HPI: 64 y.o. year old male with history below presents with gradually worsening, productive cough with yellow green mucinex for 3 weeks. He's has associated post nasal drip and throat rawness. He has tried running to loosen chest congestion with some relief to cough. He's had sick contacts. No fever, chills or nausea.    Past Medical History  Diagnosis Date  . Hyperlipidemia   . Restless legs   . Arthritis   . Allergy      Home Meds: Prior to Admission medications   Medication Sig Start Date End Date Taking? Authorizing Provider  aspirin 81 MG tablet Take 81 mg by mouth daily.   Yes Historical Provider, MD  Coenzyme Q10-Levocarnitine (CO Q-10 PLUS PO) Take 10 mg by mouth daily.   Yes Historical Provider, MD  cyclobenzaprine (FLEXERIL) 10 MG tablet Take 1 tablet (10 mg total) by mouth 3 (three) times daily as needed. 04/10/15  Yes Robyn Haber, MD  HYDROcodone-acetaminophen (NORCO) 7.5-325 MG per tablet Take 1 tablet by mouth every 6 (six) hours as needed for moderate pain. 04/10/15  Yes Robyn Haber, MD  meloxicam (MOBIC) 15 MG tablet Take 1 tablet (15 mg total) by mouth as needed for pain. 04/10/15  Yes Robyn Haber, MD  Multiple Vitamin (MULTIVITAMIN) tablet Take 1 tablet by mouth daily.   Yes Historical Provider, MD  niacin 500 MG tablet Take 1 tablet (500 mg total) by mouth at bedtime. 04/10/15  Yes Robyn Haber, MD  rOPINIRole (REQUIP) 1 MG tablet TAKE 1 TABLET BY MOUTH AT  BEDTIME 04/21/15  Yes Robyn Haber, MD  vitamin C (ASCORBIC ACID) 500 MG tablet Take 500 mg by mouth daily.   Yes Historical Provider, MD    Allergies:  Allergies  Allergen Reactions  . Penicillins Other (See Comments)    Childhood- "deathly ill"    Social History   Social History  . Marital Status: Married    Spouse Name: N/A  . Number of Children: N/A  . Years of Education: N/A   Occupational History  . MARKETING    Social History Main Topics  . Smoking status: Never Smoker   . Smokeless tobacco: Not on file  . Alcohol Use: No  . Drug Use: No  . Sexual Activity: Yes   Other Topics Concern  . Not on file   Social History Narrative   Married   Secretary/administrator   Works in Secretary/administrator          Review of Systems: Constitutional: negative for chills, fever, night sweats, weight changes, or fatigue  HEENT: negative for vision changes, hearing loss, congestion, rhinorrhea, ST, epistaxis, or sinus pressure Cardiovascular: negative for chest pain or palpitations Respiratory: negative for hemoptysis, wheezing, shortness of breath, or cough Abdominal: negative for abdominal pain, nausea, vomiting, diarrhea, or constipation Dermatological: negative for rash Neurologic: negative for headache, dizziness, or syncope All other systems reviewed and are otherwise negative with the exception to those above and in the HPI.  Physical Exam: Blood pressure 130/80, pulse 87, temperature 98.4 F (36.9 C), temperature source Oral, resp. rate 20, height 5\' 10"  (1.778 m), weight 190 lb 9.6 oz (86.456 kg), SpO2 95 %., Body mass index is 27.35 kg/(m^2). General: Well developed, well nourished, in no acute distress. Head: Normocephalic, atraumatic, eyes without discharge, sclera non-icteric, nares are without discharge. Bilateral auditory canals clear, TM's are without perforation, pearly grey and translucent with reflective cone of light bilaterally. Oral cavity moist, posterior pharynx  with erythema  Lungs: Rhonchi bilaterally.  Neck: Supple. No thyromegaly. Full ROM. No lymphadenopathy. Msk:  Strength and tone normal for age. Extremities/Skin: Warm and dry. No clubbing or cyanosis. No edema. No rashes or suspicious lesions. Neuro: Alert and oriented X 3. Moves all extremities spontaneously. Gait is normal. CNII-XII grossly in tact. Psych:  Responds to questions appropriately with a normal affect.    ASSESSMENT AND PLAN:  64 y.o. year old male with acute bronklitis This chart was scribed in my presence and reviewed by me personally.    ICD-9-CM ICD-10-CM   1. Acute bronchiolitis due to other specified organisms 466.19 J21.8 chlorpheniramine-HYDROcodone (TUSSIONEX PENNKINETIC ER) 10-8 MG/5ML SUER     azithromycin (ZITHROMAX) 500 MG tablet    Signed, Robyn Haber, MD 11/05/2015 7:49 PM

## 2016-03-03 ENCOUNTER — Ambulatory Visit (INDEPENDENT_AMBULATORY_CARE_PROVIDER_SITE_OTHER): Payer: 59 | Admitting: Family Medicine

## 2016-03-03 VITALS — BP 124/84 | HR 71 | Temp 98.1°F | Resp 16 | Ht 70.0 in | Wt 190.0 lb

## 2016-03-03 DIAGNOSIS — M25562 Pain in left knee: Secondary | ICD-10-CM | POA: Diagnosis not present

## 2016-03-03 DIAGNOSIS — G2581 Restless legs syndrome: Secondary | ICD-10-CM

## 2016-03-03 DIAGNOSIS — J4520 Mild intermittent asthma, uncomplicated: Secondary | ICD-10-CM | POA: Diagnosis not present

## 2016-03-03 DIAGNOSIS — J683 Other acute and subacute respiratory conditions due to chemicals, gases, fumes and vapors: Secondary | ICD-10-CM

## 2016-03-03 DIAGNOSIS — M25551 Pain in right hip: Secondary | ICD-10-CM

## 2016-03-03 DIAGNOSIS — R0600 Dyspnea, unspecified: Secondary | ICD-10-CM

## 2016-03-03 MED ORDER — ALBUTEROL SULFATE 108 (90 BASE) MCG/ACT IN AEPB: 2.0000 | INHALATION_SPRAY | Freq: Four times a day (QID) | RESPIRATORY_TRACT | Status: DC | PRN

## 2016-03-03 MED ORDER — ROPINIROLE HCL 1 MG PO TABS: 1.0000 mg | ORAL_TABLET | Freq: Every day | ORAL | Status: DC

## 2016-03-03 MED ORDER — CYCLOBENZAPRINE HCL 10 MG PO TABS: 10.0000 mg | ORAL_TABLET | Freq: Three times a day (TID) | ORAL | Status: DC | PRN

## 2016-03-03 MED ORDER — HYDROCODONE-ACETAMINOPHEN 7.5-325 MG PO TABS: 1.0000 | ORAL_TABLET | Freq: Four times a day (QID) | ORAL | Status: DC | PRN

## 2016-03-03 NOTE — Progress Notes (Signed)
65 yo runner who is having intermittent dyspnea at about 7 minutes of running.  He's dealing with a medial right thigh strain now with massage and dry needling.  Objective: BP 124/84 mmHg  Pulse 71  Temp(Src) 98.1 F (36.7 C) (Oral)  Resp 16  Ht 5\' 10"  (1.778 m)  Wt 190 lb (86.183 kg)  BMI 27.26 kg/m2  SpO2 98% Chest:  Clear Heart:  Reg, no murmur  Assessment:  Knee pain, acute, left - Plan: cyclobenzaprine (FLEXERIL) 10 MG tablet  Restless leg syndrome - Plan: rOPINIRole (REQUIP) 1 MG tablet  Dyspnea - Plan: Albuterol Sulfate (PROAIR RESPICLICK) 123XX123 (90 Base) MCG/ACT AEPB  Reactive airways dysfunction syndrome, mild intermittent, uncomplicated - Plan: Albuterol Sulfate (PROAIR RESPICLICK) 123XX123 (90 Base) MCG/ACT AEPB  Robyn Haber, MD

## 2016-03-03 NOTE — Patient Instructions (Addendum)
Let me know if the proair doesn't work to prevent the shortness of breath in the middle of your run.

## 2016-03-10 ENCOUNTER — Other Ambulatory Visit: Payer: Self-pay | Admitting: Family Medicine

## 2016-05-06 ENCOUNTER — Other Ambulatory Visit: Payer: Self-pay | Admitting: Family Medicine

## 2016-08-10 ENCOUNTER — Ambulatory Visit (INDEPENDENT_AMBULATORY_CARE_PROVIDER_SITE_OTHER): Payer: 59 | Admitting: Family Medicine

## 2016-08-10 VITALS — BP 122/72 | HR 74 | Temp 98.3°F | Resp 17 | Ht 70.5 in | Wt 161.0 lb

## 2016-08-10 DIAGNOSIS — J4520 Mild intermittent asthma, uncomplicated: Secondary | ICD-10-CM | POA: Diagnosis not present

## 2016-08-10 DIAGNOSIS — M25562 Pain in left knee: Secondary | ICD-10-CM | POA: Diagnosis not present

## 2016-08-10 DIAGNOSIS — Z0000 Encounter for general adult medical examination without abnormal findings: Secondary | ICD-10-CM

## 2016-08-10 DIAGNOSIS — R0600 Dyspnea, unspecified: Secondary | ICD-10-CM | POA: Diagnosis not present

## 2016-08-10 DIAGNOSIS — J683 Other acute and subacute respiratory conditions due to chemicals, gases, fumes and vapors: Secondary | ICD-10-CM

## 2016-08-10 DIAGNOSIS — M25551 Pain in right hip: Secondary | ICD-10-CM | POA: Diagnosis not present

## 2016-08-10 DIAGNOSIS — G2581 Restless legs syndrome: Secondary | ICD-10-CM | POA: Diagnosis not present

## 2016-08-10 LAB — POCT CBC
Granulocyte percent: 66.4 % (ref 37–80)
HCT, POC: 46.3 % (ref 43.5–53.7)
Hemoglobin: 16.2 g/dL (ref 14.1–18.1)
Lymph, poc: 1.8 (ref 0.6–3.4)
MCH, POC: 30.8 pg (ref 27–31.2)
MCHC: 35 g/dL (ref 31.8–35.4)
MCV: 88.2 fL (ref 80–97)
MID (cbc): 0.5 (ref 0–0.9)
MPV: 7.7 fL (ref 0–99.8)
POC Granulocyte: 4.5 (ref 2–6.9)
POC LYMPH PERCENT: 26.1 % (ref 10–50)
POC MID %: 7.5 % (ref 0–12)
Platelet Count, POC: 214 K/uL (ref 142–424)
RBC: 5.24 M/uL (ref 4.69–6.13)
RDW, POC: 12.6 %
WBC: 6.8 K/uL (ref 4.6–10.2)

## 2016-08-10 LAB — POCT GLYCOSYLATED HEMOGLOBIN (HGB A1C): Hemoglobin A1C: 5.5

## 2016-08-10 LAB — BASIC METABOLIC PANEL WITH GFR
BUN: 24 mg/dL (ref 7–25)
CO2: 24 mmol/L (ref 20–31)
Calcium: 9.6 mg/dL (ref 8.6–10.3)
Chloride: 103 mmol/L (ref 98–110)
Creat: 0.98 mg/dL (ref 0.70–1.25)
Glucose, Bld: 94 mg/dL (ref 65–99)
Potassium: 4.5 mmol/L (ref 3.5–5.3)
Sodium: 138 mmol/L (ref 135–146)

## 2016-08-10 LAB — POCT URINALYSIS DIP (MANUAL ENTRY)
Bilirubin, UA: NEGATIVE
Blood, UA: NEGATIVE
Glucose, UA: NEGATIVE
Ketones, POC UA: NEGATIVE
Leukocytes, UA: NEGATIVE
Nitrite, UA: NEGATIVE
Protein Ur, POC: NEGATIVE
Spec Grav, UA: 1.025
Urobilinogen, UA: 0.2
pH, UA: 5.5

## 2016-08-10 LAB — LIPID PANEL
Cholesterol: 176 mg/dL (ref 125–200)
HDL: 58 mg/dL
LDL Cholesterol: 105 mg/dL
Total CHOL/HDL Ratio: 3 ratio
Triglycerides: 66 mg/dL
VLDL: 13 mg/dL

## 2016-08-10 LAB — PSA: PSA: 0.7 ng/mL (ref <=4.0)

## 2016-08-10 LAB — TSH: TSH: 1.61 mIU/L (ref 0.40–4.50)

## 2016-08-10 MED ORDER — ALBUTEROL SULFATE 108 (90 BASE) MCG/ACT IN AEPB: 2.0000 | INHALATION_SPRAY | Freq: Four times a day (QID) | RESPIRATORY_TRACT | 11 refills | Status: DC | PRN

## 2016-08-10 MED ORDER — ROPINIROLE HCL 1 MG PO TABS
1.0000 mg | ORAL_TABLET | Freq: Every day | ORAL | 1 refills | Status: DC
Start: 2016-08-10 — End: 2017-02-12

## 2016-08-10 MED ORDER — CYCLOBENZAPRINE HCL 10 MG PO TABS: 10.0000 mg | ORAL_TABLET | Freq: Three times a day (TID) | ORAL | 3 refills | Status: DC | PRN

## 2016-08-10 MED ORDER — MELOXICAM 15 MG PO TABS: ORAL_TABLET | ORAL | 3 refills | Status: DC

## 2016-08-10 MED ORDER — HYDROCODONE-ACETAMINOPHEN 7.5-325 MG PO TABS: 1.0000 | ORAL_TABLET | Freq: Four times a day (QID) | ORAL | 0 refills | Status: DC | PRN

## 2016-08-10 NOTE — Progress Notes (Addendum)
Patient ID: Jonathan Beasley MRN: [id number], DOB: [birth year] 65 y.o. Date of Encounter: 08/10/2016, 11:01 AM  Primary Physician: No PCP Per Patient  Chief Complaint: Physical (CPE)  HPI: 65 y.o. y/o male with history noted below here for CPE.  Doing well. This is a very active 65 year old. He's married to Manuela Schwartz and he has 2 children who are alive and well. 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  Patient is an avid runner and has had to deal with multiple injuries over the years. He has chronic knee pain but he continues to run and compete. He has intermittent back pain for which we have prescribed hydrocodone, meloxicam, and cyclobenzaprine which he takes sparingly. He also has exercise-induced asthma for which he takes an inhaler.  He does have family history of diabetes and is wondering whether he should be on metformin. He's lost about 30 pounds over the last year by giving up potato chips, snacks, and candy. He looks great.  Review of Systems: Consitutional: No fever, chills, fatigue, night sweats, lymphadenopathy, or weight changes. Eyes: No visual changes, eye redness, or discharge. ENT/Mouth: Ears: No otalgia, tinnitus, hearing loss, discharge. Nose: No congestion, rhinorrhea, sinus pain, or epistaxis. Throat: No sore throat, post nasal drip, or teeth pain. Cardiovascular: No CP, palpitations, diaphoresis, DOE, edema, orthopnea, PND. Respiratory: No cough, hemoptysis, SOB, or wheezing. Gastrointestinal: No anorexia, dysphagia, reflux, pain, nausea, vomiting, hematemesis, diarrhea, constipation, BRBPR, or melena. Genitourinary: No dysuria, frequency, urgency, hematuria, incontinence, nocturia, decreased urinary stream, discharge, impotence, or testicular pain/masses. Musculoskeletal: No decreased ROM, myalgias,  joint swelling, or weakness.  Chronic knee pain Skin: No rash, erythema, lesion changes, pain, warmth, jaundice, or pruritis. Neurological: No headache, dizziness, syncope,  seizures, tremors, memory loss, coordination problems, or paresthesias. Psychological: No anxiety, depression, hallucinations, SI/HI. Endocrine: No fatigue, polydipsia, polyphagia, polyuria, or known diabetes. All other systems were reviewed and are otherwise negative.  Past Medical History:  Diagnosis Date  . Allergy   . Arthritis   . Hyperlipidemia   . Restless legs      Past Surgical History:  Procedure Laterality Date  . KNEE ARTHROSCOPY Right 2013    Home Meds:  Prior to Admission medications   Medication Sig Start Date End Date Taking? Authorizing Provider  Albuterol Sulfate (PROAIR RESPICLICK) 123XX123 (90 Base) MCG/ACT AEPB Inhale 2 puffs into the lungs every 6 (six) hours as needed. 03/03/16  Yes Robyn Haber, MD  aspirin 81 MG tablet Take 81 mg by mouth daily.   Yes Historical Provider, MD  Coenzyme Q10-Levocarnitine (CO Q-10 PLUS PO) Take 10 mg by mouth daily.   Yes Historical Provider, MD  cyclobenzaprine (FLEXERIL) 10 MG tablet Take 1 tablet (10 mg total) by mouth 3 (three) times daily as needed. 03/03/16  Yes Robyn Haber, MD  HYDROcodone-acetaminophen (NORCO) 7.5-325 MG tablet Take 1 tablet by mouth every 6 (six) hours as needed for moderate pain. 03/03/16  Yes Robyn Haber, MD  meloxicam (MOBIC) 15 MG tablet TAKE ONE TABLET BY MOUTH AS NEEDED FOR PAIN 05/06/16  Yes Chelle Jeffery, PA-C  Multiple Vitamin (MULTIVITAMIN) tablet Take 1 tablet by mouth daily.   Yes Historical Provider, MD  niacin 500 MG tablet Take 1 tablet (500 mg total) by mouth at bedtime. 04/10/15  Yes Robyn Haber, MD  rOPINIRole (REQUIP) 1 MG tablet Take 1 tablet (1 mg total) by mouth at bedtime. 03/03/16  Yes Robyn Haber, MD  vitamin C (ASCORBIC ACID) 500 MG tablet Take 500 mg by mouth daily.  Yes Historical Provider, MD    Allergies:  Allergies  Allergen Reactions  . Penicillins Other (See Comments)    Childhood- "deathly ill"    Social History   Social History  . Marital status:  Married    Spouse name: N/A  . Number of children: N/A  . Years of education: N/A   Occupational History  . MARKETING    Social History Main Topics  . Smoking status: Never Smoker  . Smokeless tobacco: Not on file  . Alcohol use No  . Drug use: No  . Sexual activity: Yes   Other Topics Concern  . Not on file   Social History Narrative   Married   Secretary/administrator   Works in Secretary/administrator          Family History  Problem Relation Age of Onset  . Heart disease Father   . Heart disease Maternal Grandfather   . Heart disease Paternal Grandfather     Physical Exam: Blood pressure 122/72, pulse 74, temperature 98.3 F (36.8 C), temperature source Oral, resp. rate 17, height 5' 10.5" (1.791 m), weight 161 lb (73 kg), SpO2 97 %.  BP Readings from Last 3 Encounters:  08/10/16 122/72  03/03/16 124/84  11/05/15 130/80   General: Well developed, well nourished, in no acute distress. HEENT: Normocephalic, atraumatic. Conjunctiva pink, sclera non-icteric. Pupils 2 mm constricting to 1 mm, round, regular, and equally reactive to light and accomodation. EOMI.  Fundi benign   Internal auditory canal clear. TMs with good cone of light and without pathology. Nasal mucosa pink. Nares are without discharge. No sinus tenderness. Oral mucosa pink. Dentition multiple fillings. Pharynx without exudate.    Neck: Supple. Trachea midline. No thyromegaly. Full ROM. No lymphadenopathy. Lungs: Clear to auscultation bilaterally without wheezes, rales, or rhonchi. Breathing is of normal effort and unlabored. Cardiovascular: RRR with S1 S2. No murmurs, rubs, or gallops appreciated. Distal pulses 2+ symmetrically. No carotid or abdominal bruits Abdomen: Soft, non-tender, non-distended with normoactive bowel sounds. No hepatosplenomegaly or masses. No rebound/guarding. No CVA tenderness. Without hernias.  Rectal: No external hemorrhoids or fissures. Rectal vault without masses.  Genitourinary:   circumcised male. No penile lesions. Testes descended bilaterally, and smooth without tenderness or masses.  Musculoskeletal: Full range of motion and 5/5 strength throughout. Without swelling, atrophy, tenderness, crepitus, or warmth. Extremities without clubbing, cyanosis, or edema. Calves supple. Skin: Warm and moist without erythema, ecchymosis, wounds, or rash. Neuro: A+Ox3. CN II-XII grossly intact. Moves all extremities spontaneously. Full sensation throughout. Normal gait. DTR 2+ throughout upper and lower extremities. Finger to nose intact.  Very fine action tremor both hands.  Spontaneous muscle fasciculations right calf. Psych:  Responds to questions appropriately with a normal affect.   Lab Results  Component Value Date   CHOL 220 (H) 04/10/2015   CHOL 217 (H) 07/04/2014   CHOL 159 07/26/2013   Lab Results  Component Value Date   HDL 37 (L) 04/10/2015   HDL 38 (L) 07/04/2014   HDL 33 (L) 07/26/2013   Lab Results  Component Value Date   LDLCALC 148 (H) 04/10/2015   LDLCALC 127 (H) 07/04/2014   LDLCALC 78 07/26/2013   Lab Results  Component Value Date   TRIG 174 (H) 04/10/2015   TRIG 258 (H) 07/04/2014   TRIG 240 (H) 07/26/2013   Lab Results  Component Value Date   CHOLHDL 5.9 04/10/2015   CHOLHDL 5.7 07/04/2014   CHOLHDL 4.8 07/26/2013   Results for orders  placed or performed in visit on 08/10/16  POCT CBC  Result Value Ref Range   WBC 6.8 4.6 - 10.2 K/uL   Lymph, poc 1.8 0.6 - 3.4   POC LYMPH PERCENT 26.1 10 - 50 %L   MID (cbc) 0.5 0 - 0.9   POC MID % 7.5 0 - 12 %M   POC Granulocyte 4.5 2 - 6.9   Granulocyte percent 66.4 37 - 80 %G   RBC 5.24 4.69 - 6.13 M/uL   Hemoglobin 16.2 14.1 - 18.1 g/dL   HCT, POC 46.3 43.5 - 53.7 %   MCV 88.2 80 - 97 fL   MCH, POC 30.8 27 - 31.2 pg   MCHC 35.0 31.8 - 35.4 g/dL   RDW, POC 12.6 %   Platelet Count, POC 214 142 - 424 K/uL   MPV 7.7 0 - 99.8 fL  POCT urinalysis dipstick  Result Value Ref Range   Color, UA yellow  yellow   Clarity, UA clear clear   Glucose, UA negative negative   Bilirubin, UA negative negative   Ketones, POC UA negative negative   Spec Grav, UA 1.025    Blood, UA negative negative   pH, UA 5.5    Protein Ur, POC negative negative   Urobilinogen, UA 0.2    Nitrite, UA Negative Negative   Leukocytes, UA Negative Negative  POCT glycosylated hemoglobin (Hb A1C)  Result Value Ref Range   Hemoglobin A1C 5.5      Assessment/Plan:  65 y.o. y/o very active male here for CPE Annual physical exam - Plan: POCT CBC, Basic metabolic panel, Lipid panel, POCT urinalysis dipstick, TSH, PSA, POCT glycosylated hemoglobin (Hb A1C)  Restless leg syndrome - Plan: rOPINIRole (REQUIP) 1 MG tablet  Right hip pain - Plan: HYDROcodone-acetaminophen (NORCO) 7.5-325 MG tablet  Knee pain, acute, left - Plan: cyclobenzaprine (FLEXERIL) 10 MG tablet  Dyspnea - Plan: Albuterol Sulfate (PROAIR RESPICLICK) 123XX123 (90 Base) MCG/ACT AEPB  Reactive airways dysfunction syndrome, mild intermittent, uncomplicated - Plan: Albuterol Sulfate (PROAIR RESPICLICK) 123XX123 (90 Base) MCG/ACT AEPB  I am referring him to [facility], and excellent internists here in town was also acquainted with Mr. Franey's family. Signed, Robyn Haber, MD 08/10/2016 11:01 AM

## 2016-08-10 NOTE — Patient Instructions (Addendum)
   IF you received an x-ray today, you will receive an invoice from [facility]. Please contact  Radiology at [phone] with questions or concerns regarding your invoice.   IF you received labwork today, you will receive an invoice from [facility]. Please contact [facility] at [phone] with questions or concerns regarding your invoice.   Our billing staff will not be able to assist you with questions regarding bills from these companies.  You will be contacted with the lab results as soon as they are available. The fastest way to get your results is to activate your My Chart account. Instructions are located on the last page of this paperwork. If you have not heard from us regarding the results in 2 weeks, please contact this office.    Health Maintenance, Male A healthy lifestyle and preventative care can promote health and wellness.  Maintain regular health, dental, and eye exams.  Eat a healthy diet. Foods like vegetables, fruits, whole grains, low-fat dairy products, and lean protein foods contain the nutrients you need and are low in calories. Decrease your intake of foods high in solid fats, added sugars, and salt. Get information about a proper diet from your health care provider, if necessary.  Regular physical exercise is one of the most important things you can do for your health. Most adults should get at least 150 minutes of moderate-intensity exercise (any activity that increases your heart rate and causes you to sweat) each week. In addition, most adults need muscle-strengthening exercises on 2 or more days a week.   Maintain a healthy weight. The body mass index (BMI) is a screening tool to identify possible weight problems. It provides an estimate of body fat based on height and weight. Your health care provider can find your BMI and can help you achieve or maintain a healthy weight. For males 20 years and older:  A BMI  below 18.5 is considered underweight.  A BMI of 18.5 to 24.9 is normal.  A BMI of 25 to 29.9 is considered overweight.  A BMI of 30 and above is considered obese.  Maintain normal blood lipids and cholesterol by exercising and minimizing your intake of saturated fat. Eat a balanced diet with plenty of fruits and vegetables. Blood tests for lipids and cholesterol should begin at age 20 and be repeated every 5 years. If your lipid or cholesterol levels are high, you are over age 50, or you are at high risk for heart disease, you may need your cholesterol levels checked more frequently.Ongoing high lipid and cholesterol levels should be treated with medicines if diet and exercise are not working.  If you smoke, find out from your health care provider how to quit. If you do not use tobacco, do not start.  Lung cancer screening is recommended for adults aged 55-80 years who are at high risk for developing lung cancer because of a history of smoking. A yearly low-dose CT scan of the lungs is recommended for people who have at least a 30-pack-year history of smoking and are current smokers or have quit within the past 15 years. A pack year of smoking is smoking an average of 1 pack of cigarettes a day for 1 year (for example, a 30-pack-year history of smoking could mean smoking 1 pack a day for 30 years or 2 packs a day for 15 years). Yearly screening should continue until the smoker has stopped smoking for at least 15 years. Yearly screening should be stopped   for people who develop a health problem that would prevent them from having lung cancer treatment.  If you choose to drink alcohol, do not have more than 2 drinks per day. One drink is considered to be 12 oz (360 mL) of beer, 5 oz (150 mL) of wine, or 1.5 oz (45 mL) of liquor.  Avoid the use of street drugs. Do not share needles with anyone. Ask for help if you need support or instructions about stopping the use of drugs.  High blood pressure  causes heart disease and increases the risk of stroke. High blood pressure is more likely to develop in:  People who have blood pressure in the end of the normal range (100-139/85-89 mm Hg).  People who are overweight or obese.  People who are African American.  If you are 18-39 years of age, have your blood pressure checked every 3-5 years. If you are 40 years of age or older, have your blood pressure checked every year. You should have your blood pressure measured twice--once when you are at a hospital or clinic, and once when you are not at a hospital or clinic. Record the average of the two measurements. To check your blood pressure when you are not at a hospital or clinic, you can use:  An automated blood pressure machine at a pharmacy.  A home blood pressure monitor.  If you are 45-79 years old, ask your health care provider if you should take aspirin to prevent heart disease.  Diabetes screening involves taking a blood sample to check your fasting blood sugar level. This should be done once every 3 years after age 45 if you are at a normal weight and without risk factors for diabetes. Testing should be considered at a younger age or be carried out more frequently if you are overweight and have at least 1 risk factor for diabetes.  Colorectal cancer can be detected and often prevented. Most routine colorectal cancer screening begins at the age of 50 and continues through age 75. However, your health care provider may recommend screening at an earlier age if you have risk factors for colon cancer. On a yearly basis, your health care provider may provide home test kits to check for hidden blood in the stool. A small camera at the end of a tube may be used to directly examine the colon (sigmoidoscopy or colonoscopy) to detect the earliest forms of colorectal cancer. Talk to your health care provider about this at age 50 when routine screening begins. A direct exam of the colon should be repeated  every 5-10 years through age 75, unless early forms of precancerous polyps or small growths are found.  People who are at an increased risk for hepatitis B should be screened for this virus. You are considered at high risk for hepatitis B if:  You were born in a country where hepatitis B occurs often. Talk with your health care provider about which countries are considered high risk.  Your parents were born in a high-risk country and you have not received a shot to protect against hepatitis B (hepatitis B vaccine).  You have HIV or AIDS.  You use needles to inject street drugs.  You live with, or have sex with, someone who has hepatitis B.  You are a man who has sex with other men (MSM).  You get hemodialysis treatment.  You take certain medicines for conditions like cancer, organ transplantation, and autoimmune conditions.  Hepatitis C blood testing is recommended for   all people born from 1945 through 1965 and any individual with known risk factors for hepatitis C.  Healthy men should no longer receive prostate-specific antigen (PSA) blood tests as part of routine cancer screening. Talk to your health care provider about prostate cancer screening.  Testicular cancer screening is not recommended for adolescents or adult males who have no symptoms. Screening includes self-exam, a health care provider exam, and other screening tests. Consult with your health care provider about any symptoms you have or any concerns you have about testicular cancer.  Practice safe sex. Use condoms and avoid high-risk sexual practices to reduce the spread of sexually transmitted infections (STIs).  You should be screened for STIs, including gonorrhea and chlamydia if:  You are sexually active and are younger than 24 years.  You are older than 24 years, and your health care provider tells you that you are at risk for this type of infection.  Your sexual activity has changed since you were last screened,  and you are at an increased risk for chlamydia or gonorrhea. Ask your health care provider if you are at risk.  If you are at risk of being infected with HIV, it is recommended that you take a prescription medicine daily to prevent HIV infection. This is called pre-exposure prophylaxis (PrEP). You are considered at risk if:  You are a man who has sex with other men (MSM).  You are a heterosexual man who is sexually active with multiple partners.  You take drugs by injection.  You are sexually active with a partner who has HIV.  Talk with your health care provider about whether you are at high risk of being infected with HIV. If you choose to begin PrEP, you should first be tested for HIV. You should then be tested every 3 months for as long as you are taking PrEP.  Use sunscreen. Apply sunscreen liberally and repeatedly throughout the day. You should seek shade when your shadow is shorter than you. Protect yourself by wearing long sleeves, pants, a wide-brimmed hat, and sunglasses year round whenever you are outdoors.  Tell your health care provider of new moles or changes in moles, especially if there is a change in shape or color. Also, tell your health care provider if a mole is larger than the size of a pencil eraser.  A one-time screening for abdominal aortic aneurysm (AAA) and surgical repair of large AAAs by ultrasound is recommended for men aged 65-75 years who are current or former smokers.  Stay current with your vaccines (immunizations).   This information is not intended to replace advice given to you by your health care provider. Make sure you discuss any questions you have with your health care provider.   Document Released: 04/29/2008 Document Revised: 11/22/2014 Document Reviewed: 03/29/2011 Elsevier Interactive Patient Education 2016 Elsevier Inc.  

## 2016-12-07 ENCOUNTER — Telehealth: Payer: 59 | Admitting: Family

## 2016-12-07 DIAGNOSIS — J019 Acute sinusitis, unspecified: Secondary | ICD-10-CM

## 2016-12-07 DIAGNOSIS — B9689 Other specified bacterial agents as the cause of diseases classified elsewhere: Secondary | ICD-10-CM

## 2016-12-07 MED ORDER — AZITHROMYCIN 250 MG PO TABS: 250.0000 mg | ORAL_TABLET | Freq: Every day | ORAL | 0 refills | Status: DC

## 2016-12-07 NOTE — Progress Notes (Signed)
We are sorry that you are not feeling well.  Here is how we plan to help!  Based on what you have shared with me it looks like you have sinusitis.  Sinusitis is inflammation and infection in the sinus cavities of the head.  Based on your presentation I believe you most likely have Acute Bacterial Sinusitis.  This is an infection caused by bacteria and is treated with antibiotics. I have prescribed Z-pak You may use an oral decongestant such as Mucinex D or if you have glaucoma or high blood pressure use plain Mucinex. Saline nasal spray help and can safely be used as often as needed for congestion.  If you develop worsening sinus pain, fever or notice severe headache and vision changes, or if symptoms are not better after completion of antibiotic, please schedule an appointment with a health care provider.    Sinus infections are not as easily transmitted as other respiratory infection, however we still recommend that you avoid close contact with loved ones, especially the very young and elderly.  Remember to wash your hands thoroughly throughout the day as this is the number one way to prevent the spread of infection!  Home Care:  Only take medications as instructed by your medical team.  Complete the entire course of an antibiotic.  Do not take these medications with alcohol.  A steam or ultrasonic humidifier can help congestion.  You can place a towel over your head and breathe in the steam from hot water coming from a faucet.  Avoid close contacts especially the very young and the elderly.  Cover your mouth when you cough or sneeze.  Always remember to wash your hands.  Get Help Right Away If:  You develop worsening fever or sinus pain.  You develop a severe head ache or visual changes.  Your symptoms persist after you have completed your treatment plan.  Make sure you  Understand these instructions.  Will watch your condition.  Will get help right away if you are not doing  well or get worse.  Your e-visit answers were reviewed by a board certified advanced clinical practitioner to complete your personal care plan.  Depending on the condition, your plan could have included both over the counter or prescription medications.  If there is a problem please reply  once you have received a response from your provider.  Your safety is important to us.  If you have drug allergies check your prescription carefully.    You can use MyChart to ask questions about today's visit, request a non-urgent call back, or ask for a work or school excuse for 24 hours related to this e-Visit. If it has been greater than 24 hours you will need to follow up with your provider, or enter a new e-Visit to address those concerns.  You will get an e-mail in the next two days asking about your experience.  I hope that your e-visit has been valuable and will speed your recovery. Thank you for using e-visits.   

## 2017-02-12 ENCOUNTER — Other Ambulatory Visit: Payer: Self-pay

## 2017-02-12 DIAGNOSIS — G2581 Restless legs syndrome: Secondary | ICD-10-CM

## 2017-02-12 NOTE — Telephone Encounter (Signed)
07/2016 last ov 

## 2017-02-15 MED ORDER — ROPINIROLE HCL 1 MG PO TABS: 1.0000 mg | ORAL_TABLET | Freq: Every day | ORAL | 0 refills | Status: DC

## 2017-02-15 NOTE — Telephone Encounter (Signed)
Pt needs to schedule an appt with his new PCP before further refills.

## 2017-02-21 DIAGNOSIS — S76311A Strain of muscle, fascia and tendon of the posterior muscle group at thigh level, right thigh, initial encounter: Secondary | ICD-10-CM | POA: Diagnosis not present

## 2017-02-23 DIAGNOSIS — S76311A Strain of muscle, fascia and tendon of the posterior muscle group at thigh level, right thigh, initial encounter: Secondary | ICD-10-CM | POA: Diagnosis not present

## 2017-02-28 DIAGNOSIS — S76311A Strain of muscle, fascia and tendon of the posterior muscle group at thigh level, right thigh, initial encounter: Secondary | ICD-10-CM | POA: Diagnosis not present

## 2017-02-28 NOTE — Progress Notes (Signed)
[facility] [address], [place] [address] Phone: [phone] Subjective:    I'm seeing this patient by the request  of:    CC: Hamstring pain  [id number]  Jonathan Beasley is a 66 y.o. male coming in with complaint of hamstring pain. Seems to be bilateral.  High on the right and low on the left. Patient states that he is an avid runner. Patient once continue running but finds it difficult secondary to pain. Patient remembers 10 days ago was trying to run and when he tried to pick of the pes anserine severe amount of pain proximally on the right hamstring. Audible pop hurt. Patient states that her some mild weakness. Mild radiation of pain. Has had dry needling done with some mild benefit. Patient rates the severity pain is 6 out of 10. Patient has a goal was competing in some different races in the near future once to know when he can start running again regularly.     Past Medical History:  Diagnosis Date  . Allergy   . Arthritis   . Hyperlipidemia   . Restless legs    Past Surgical History:  Procedure Laterality Date  . KNEE ARTHROSCOPY Right 2013   Social History   Social History  . Marital status: Married    Spouse name: N/A  . Number of children: N/A  . Years of education: N/A   Occupational History  . MARKETING    Social History Main Topics  . Smoking status: Never Smoker  . Smokeless tobacco: Never Used  . Alcohol use No  . Drug use: No  . Sexual activity: Yes   Other Topics Concern  . 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 Penicillins Other (See Comments)    Childhood- "deathly ill"   Family History  Problem Relation Age of Onset  . Heart disease Father   . Heart disease Maternal Grandfather   . Heart disease Paternal Grandfather     Past medical history, social, surgical and family history all reviewed in  electronic medical record.  No pertanent information unless stated regarding to the chief complaint.   Review of Systems:Review of systems updated and as accurate as of 03/01/17  No headache, visual changes, nausea, vomiting, diarrhea, constipation, dizziness, abdominal pain, skin rash, fevers, chills, night sweats, weight loss, swollen lymph nodes, body aches, joint swelling, muscle aches, chest pain, shortness of breath, mood changes.   Objective  Blood pressure 122/82, pulse (!) 106, resp. rate 16, weight 162 lb (73.5 kg), SpO2 92 %. Systems examined below as of 03/01/17   General: No apparent distress alert and oriented x3 mood and affect normal, dressed appropriately.  HEENT: Pupils equal, extraocular movements intact  Respiratory: Patient's speak in full sentences and does not appear short of breath  Cardiovascular: No lower extremity edema, non tender, no erythema  Skin: Warm dry intact with no signs of infection or rash on extremities or on axial skeleton.  Abdomen: Soft nontender  Neuro: Cranial nerves II through XII are intact, neurovascularly intact in all extremities with 2+ DTRs and 2+ pulses.  Lymph: No lymphadenopathy of posterior or anterior cervical chain or axillae bilaterally.  Gait normal with good balance and coordination.  MSK:  Non tender with full range of motion and good stability and symmetric strength and tone  of shoulders, elbows, wrist, hip, knee and ankles bilaterally.  Patient right hamstring does not have a true defect that is severely tender to palpation in a very specific area just 3 cm from the buttock cleft. Patient does have pain with resisted flexion. No pain over the ischial area. No pain distally. Neurovascular intact distally. Negative straight leg test.  Limited muscular skeletal ultrasound was performed and interpreted by Lyndal Pulley  Limited ultrasound the patient's right hamstring shows just proximal to the muscular tendon juncture an area what  appears to be significant scar tissue formation. Approximate 2 cm in diameter. Increasing Doppler flow. No true muscle defect noted at this time. Impression: Hamstring tear with interval healing   Impression and Recommendations:     This case required medical decision making of moderate complexity.      Note: This dictation was prepared with Dragon dictation along with smaller phrase technology. Any transcriptional errors that result from this process are unintentional.

## 2017-03-01 ENCOUNTER — Ambulatory Visit: Payer: Self-pay

## 2017-03-01 ENCOUNTER — Ambulatory Visit (INDEPENDENT_AMBULATORY_CARE_PROVIDER_SITE_OTHER): Payer: 59 | Admitting: Family Medicine

## 2017-03-01 ENCOUNTER — Encounter: Payer: Self-pay | Admitting: Family Medicine

## 2017-03-01 ENCOUNTER — Ambulatory Visit (INDEPENDENT_AMBULATORY_CARE_PROVIDER_SITE_OTHER)
Admission: RE | Admit: 2017-03-01 | Discharge: 2017-03-01 | Disposition: A | Discharge status: Home / Self Care | Payer: 59 | Source: Ambulatory Visit | Attending: Family Medicine | Admitting: Family Medicine

## 2017-03-01 VITALS — BP 122/82 | HR 106 | Resp 16 | Wt 162.0 lb

## 2017-03-01 DIAGNOSIS — M79606 Pain in leg, unspecified: Secondary | ICD-10-CM

## 2017-03-01 DIAGNOSIS — S76311A Strain of muscle, fascia and tendon of the posterior muscle group at thigh level, right thigh, initial encounter: Secondary | ICD-10-CM | POA: Insufficient documentation

## 2017-03-01 DIAGNOSIS — M545 Low back pain: Secondary | ICD-10-CM | POA: Diagnosis not present

## 2017-03-01 HISTORY — DX: Strain of muscle, fascia and tendon of the posterior muscle group at thigh level, right thigh, initial encounter: S76.311A

## 2017-03-01 MED ORDER — GABAPENTIN 100 MG PO CAPS: 200.0000 mg | ORAL_CAPSULE | Freq: Every day | ORAL | 3 refills | Status: DC

## 2017-03-01 MED ORDER — NITROGLYCERIN 0.2 MG/HR TD PT24: MEDICATED_PATCH | TRANSDERMAL | 1 refills | Status: DC

## 2017-03-01 NOTE — Patient Instructions (Signed)
head injury Ice 20 minutes 2 times daily. Usually after activity and before bed. COmpression sleeve with any activity more then walking Exercises 3 times a week.  Show Jonathan Beasley No jumping, sprinting or running OK to run after the 30th and would only do 2 times a week then consider seeing me again to make sure healing fine before letting the reigns off Jonathan Beasley 200mg  at night Nitroglycerin Protocol   Apply 1/4 nitroglycerin patch to affected area daily.  Change position of patch within the affected area every 24 hours.  You may experience a headache during the first 1-2 weeks of using the patch, these should subside.  If you experience headaches after beginning nitroglycerin patch treatment, you may take your preferred over the counter pain reliever.  Another side effect of the nitroglycerin patch is skin irritation or rash related to patch adhesive.  Please notify our office if you develop more severe headaches or rash, and stop the patch.  Tendon healing with nitroglycerin patch may require 12 to 24 weeks depending on the extent of injury.  Men should not use if taking Viagra, Cialis, or Levitra.   Do not use if you have migraines or rosacea.   Over the counter Iron 65mg  with 500mg  of vitamin C 3 times a week if not daily  Vitamin D 2000 IU daily  Whey portein isolate or plant protein supplement.  Make sure you stay hydrated and eat within 30 minutes of working out.  See me again in 3-4 weeks.

## 2017-03-01 NOTE — Progress Notes (Signed)
Pre-visit discussion using our clinic review tool. No additional management support is needed unless otherwise documented below in the visit note.  

## 2017-03-01 NOTE — Assessment & Plan Note (Signed)
Ultrasound diagnosis today. Patient given gabapentin help with nighttime relief as well as we will get x-rays to rule out any type of back arthritis that could be contribute in. We discussed icing regimen, home exercises, core stability. Patient work with Product/process development scientist to learn him in greater detail. Patient will continue seen physical therapy as well. Follow-up again in 3-4 weeks and advance accordingly. Patient would like to run

## 2017-03-02 DIAGNOSIS — S76311A Strain of muscle, fascia and tendon of the posterior muscle group at thigh level, right thigh, initial encounter: Secondary | ICD-10-CM | POA: Diagnosis not present

## 2017-03-07 DIAGNOSIS — S76311A Strain of muscle, fascia and tendon of the posterior muscle group at thigh level, right thigh, initial encounter: Secondary | ICD-10-CM | POA: Diagnosis not present

## 2017-03-09 DIAGNOSIS — S76311A Strain of muscle, fascia and tendon of the posterior muscle group at thigh level, right thigh, initial encounter: Secondary | ICD-10-CM | POA: Diagnosis not present

## 2017-03-14 DIAGNOSIS — S76311A Strain of muscle, fascia and tendon of the posterior muscle group at thigh level, right thigh, initial encounter: Secondary | ICD-10-CM | POA: Diagnosis not present

## 2017-03-16 ENCOUNTER — Other Ambulatory Visit: Payer: Self-pay | Admitting: Physician Assistant

## 2017-03-16 DIAGNOSIS — S76311A Strain of muscle, fascia and tendon of the posterior muscle group at thigh level, right thigh, initial encounter: Secondary | ICD-10-CM | POA: Diagnosis not present

## 2017-03-16 DIAGNOSIS — G2581 Restless legs syndrome: Secondary | ICD-10-CM

## 2017-03-23 ENCOUNTER — Ambulatory Visit (INDEPENDENT_AMBULATORY_CARE_PROVIDER_SITE_OTHER): Payer: 59 | Admitting: Family Medicine

## 2017-03-23 ENCOUNTER — Encounter: Payer: Self-pay | Admitting: Family Medicine

## 2017-03-23 ENCOUNTER — Ambulatory Visit: Payer: Self-pay

## 2017-03-23 ENCOUNTER — Telehealth: Payer: Self-pay | Admitting: Family Medicine

## 2017-03-23 VITALS — BP 126/74 | HR 61 | Resp 16 | Wt 163.0 lb

## 2017-03-23 DIAGNOSIS — M79604 Pain in right leg: Secondary | ICD-10-CM

## 2017-03-23 DIAGNOSIS — S76311A Strain of muscle, fascia and tendon of the posterior muscle group at thigh level, right thigh, initial encounter: Secondary | ICD-10-CM

## 2017-03-23 NOTE — Patient Instructions (Signed)
Good to see you  You have good scar tissue formation.  OK to do 8-10 minute mile The leg is because you are pulling up on the bike pedal, try to keep foot flat and focus on pushing down on the pedal.  Compression still is good Can try up to 1/2 patch of the nitro and focus on the area I marked.  See me again in 4 weeks.

## 2017-03-23 NOTE — Telephone Encounter (Signed)
Is requesting copy of OV today to be faxed to them.

## 2017-03-23 NOTE — Assessment & Plan Note (Signed)
Improving at this time. Patient does have a good scar tissue formation. We discussed continuing with the conservative therapy. Start to advance him with his running. Patient will come back again in 4 weeks for further evaluation. Encourage him to continue with the nitroglycerin as well as the gabapentin.

## 2017-03-23 NOTE — Progress Notes (Signed)
[facility] [address], [place] [address] Phone: [phone] Subjective:    I'm seeing this patient by the request  of:    CC: Hamstring pain f/u   [id number]  Jonathan Beasley is a 66 y.o. male coming in with complaint of hamstring pain. More on the right side. Patient with possible tear. Has been doing conservative therapy including home exercises, dry needling, compression. Patient states that making improvement. Patient denies any numbness but states that still if he tries to increase his stride length he has difficulty. Rates that he is approximately 50-60% better.     Past Medical History:  Diagnosis Date  . Allergy   . Arthritis   . Hyperlipidemia   . Restless legs    Past Surgical History:  Procedure Laterality Date  . KNEE ARTHROSCOPY Right 2013   Social History   Social History  . Marital status: Married    Spouse name: N/A  . Number of children: N/A  . Years of education: N/A   Occupational History  . MARKETING    Social History Main Topics  . Smoking status: Never Smoker  . Smokeless tobacco: Never Used  . Alcohol use No  . Drug use: No  . Sexual activity: Yes   Other Topics Concern  . 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 Penicillins Other (See Comments)    Childhood- "deathly ill"   Family History  Problem Relation Age of Onset  . Heart disease Father   . Heart disease Maternal Grandfather   . Heart disease Paternal Grandfather     Past medical history, social, surgical and family history all reviewed in electronic medical record.  No pertanent information unless stated regarding to the chief complaint.   Review of Systems: No headache, visual changes, nausea, vomiting, diarrhea, constipation, dizziness, abdominal pain, skin rash, fevers, chills, night sweats, weight loss, swollen lymph nodes, body aches,  joint swelling, muscle aches, chest pain, shortness of breath, mood changes.    Objective  Blood pressure 126/74, pulse 61, resp. rate 16, weight 163 lb (73.9 kg), SpO2 98 %.   Systems examined below as of 03/23/17 General: NAD A&O x3 mood, affect normal  HEENT: Pupils equal, extraocular movements intact no nystagmus Respiratory: not short of breath at rest or with speaking Cardiovascular: No lower extremity edema, non tender Skin: Warm dry intact with no signs of infection or rash on extremities or on axial skeleton. Abdomen: Soft nontender, no masses Neuro: Cranial nerves  intact, neurovascularly intact in all extremities with 2+ DTRs and 2+ pulses. Lymph: No lymphadenopathy appreciated today  Gait normal with good balance and coordination.  MSK: Non tender with full range of motion and good stability and symmetric strength and tone of shoulders, elbows, wrist,  knee hips and ankles bilaterally.   Patient right hamstring still at the area of 3 cm from the buttocks cleft patient does have an area that still moderately tender to palpation. Improvement in strength from previous exam.  Limited muscular skeletal ultrasound was performed and interpreted by Lyndal Pulley  Limited ultrasound the patient's right hamstring shows just proximal to the muscular tendon juncture an area what appears to be significant scar tissue formation. Patient scar tissue seems to be even more. Increasing Doppler flow from previous exam. Impression: Hamstring tear with interval  healing another improvement from previous exam.   Impression and Recommendations:     This case required medical decision making of moderate complexity.      Note: This dictation was prepared with Dragon dictation along with smaller phrase technology. Any transcriptional errors that result from this process are unintentional.

## 2017-04-23 NOTE — Progress Notes (Signed)
[facility] [place], [place] [address] Phone: [phone] Subjective:     CC: Hamstring pain f/u   [id number]  Jonathan Beasley is a 66 y.o. male coming in with complaint of hamstring pain. More on the right side. Patient with possible tear. Patient's has been doing better again. States that he is been running regularly. Does not take any of the time off. Doing the exercises intermittently. Has been icing which has been helpful. Patient denies any worsening sensation. When patient tries to pick up the pace to 44min/miles notices some tightness.     Past Medical History:  Diagnosis Date  . Allergy   . Arthritis   . Hyperlipidemia   . Restless legs    Past Surgical History:  Procedure Laterality Date  . KNEE ARTHROSCOPY Right 2013   Social History   Social History  . Marital status: Married    Spouse name: N/A  . Number of children: N/A  . Years of education: N/A   Occupational History  . MARKETING    Social History Main Topics  . Smoking status: Never Smoker  . Smokeless tobacco: Never Used  . Alcohol use No  . Drug use: No  . Sexual activity: Yes   Other Topics Concern  . 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 Penicillins Other (See Comments)    Childhood- "deathly ill"   Family History  Problem Relation Age of Onset  . Heart disease Father   . Heart disease Maternal Grandfather   . Heart disease Paternal Grandfather     Past medical history, social, surgical and family history all reviewed in electronic medical record.  No pertanent information unless stated regarding to the chief complaint.   Review of Systems: No headache, visual changes, nausea, vomiting, diarrhea, constipation, dizziness, abdominal pain, skin rash, fevers, chills, night sweats, weight loss, swollen lymph nodes, body aches, joint swelling, muscle  aches, chest pain, shortness of breath, mood changes.    Objective  Blood pressure 130/80, pulse 75, height 5\' 11"  (1.803 m), weight 163 lb (73.9 kg), SpO2 95 %.   Systems examined below as of 04/25/17 General: NAD A&O x3 mood, affect normal  HEENT: Pupils equal, extraocular movements intact no nystagmus Respiratory: not short of breath at rest or with speaking Cardiovascular: No lower extremity edema, non tender Skin: Warm dry intact with no signs of infection or rash on extremities or on axial skeleton. Abdomen: Soft nontender, no masses Neuro: Cranial nerves  intact, neurovascularly intact in all extremities with 2+ DTRs and 2+ pulses. Lymph: No lymphadenopathy appreciated today  Gait normal with good balance and coordination.  MSK: Non tender with full range of motion and good stability and symmetric strength and tone of shoulders, elbows, wrist,  knee hips and ankles bilaterally.   Hamstring exam shows the patient does have full range of motion. Patient still has significant tightness of the piriformis. Patient does lack the last 5 of extension.  Limited muscular skeletal ultrasound was performed and interpreted by Lyndal Pulley  Limited ultrasound of the right hamstring at the insertion shows the patient's avulsion fracture has completely healed. Patient does have some calcific changes of the tendon at its origin. Mild increase in Doppler flow noted. Possible Small intersubstance tear still noted Impression: Interval healing of the hamstring tendons  with some mild calcific changes but healed avulsion fracture   Impression and Recommendations:     This case required medical decision making of moderate complexity.      Note: This dictation was prepared with Dragon dictation along with smaller phrase technology. Any transcriptional errors that result from this process are unintentional.

## 2017-04-25 ENCOUNTER — Ambulatory Visit (INDEPENDENT_AMBULATORY_CARE_PROVIDER_SITE_OTHER): Payer: 59 | Admitting: Family Medicine

## 2017-04-25 ENCOUNTER — Ambulatory Visit: Payer: Self-pay

## 2017-04-25 ENCOUNTER — Encounter: Payer: Self-pay | Admitting: Family Medicine

## 2017-04-25 VITALS — BP 130/80 | HR 75 | Ht 71.0 in | Wt 163.0 lb

## 2017-04-25 DIAGNOSIS — S76311A Strain of muscle, fascia and tendon of the posterior muscle group at thigh level, right thigh, initial encounter: Secondary | ICD-10-CM

## 2017-04-25 MED ORDER — GABAPENTIN 100 MG PO CAPS: 200.0000 mg | ORAL_CAPSULE | Freq: Every day | ORAL | 3 refills | Status: DC

## 2017-04-25 NOTE — Assessment & Plan Note (Signed)
Patient is making significant improvement at this time. We discussed icing regimen and home exercises. Patient is to increase activity as tolerated. Continue the nitroglycerin at this point. If patient seems to plateau we may need to consider PRP injections. Patient will continue the gabapentin. Follow-up again in 6-8 weeks

## 2017-04-25 NOTE — Patient Instructions (Signed)
Good to see you  Jonathan Beasley is your friend.  Keep doing what you are doing. Avulsion fracture is healed.  The muscle looks good Tendon at the origin still has small tear and there is calcium that needs to still turn over.  OK to increase a little more and a little more Dry needling could help  Continue the nitro but higher COnsider PRP See me again in 6 weeks.

## 2017-05-25 ENCOUNTER — Other Ambulatory Visit: Payer: Self-pay | Admitting: Family Medicine

## 2017-05-25 NOTE — Telephone Encounter (Signed)
Refill done.  

## 2017-06-22 ENCOUNTER — Other Ambulatory Visit: Payer: Self-pay | Admitting: Family Medicine

## 2017-06-29 ENCOUNTER — Ambulatory Visit (INDEPENDENT_AMBULATORY_CARE_PROVIDER_SITE_OTHER): Payer: 59 | Admitting: Family Medicine

## 2017-06-29 ENCOUNTER — Encounter: Payer: Self-pay | Admitting: Family Medicine

## 2017-06-29 VITALS — BP 132/82 | HR 56 | Temp 97.7°F | Ht 71.0 in | Wt 163.6 lb

## 2017-06-29 DIAGNOSIS — G2581 Restless legs syndrome: Secondary | ICD-10-CM

## 2017-06-29 DIAGNOSIS — Z125 Encounter for screening for malignant neoplasm of prostate: Secondary | ICD-10-CM | POA: Diagnosis not present

## 2017-06-29 DIAGNOSIS — M25551 Pain in right hip: Secondary | ICD-10-CM

## 2017-06-29 DIAGNOSIS — E785 Hyperlipidemia, unspecified: Secondary | ICD-10-CM

## 2017-06-29 DIAGNOSIS — M1990 Unspecified osteoarthritis, unspecified site: Secondary | ICD-10-CM

## 2017-06-29 DIAGNOSIS — Z0000 Encounter for general adult medical examination without abnormal findings: Secondary | ICD-10-CM

## 2017-06-29 LAB — COMPREHENSIVE METABOLIC PANEL
ALT: 23 U/L (ref 0–53)
AST: 31 U/L (ref 0–37)
Albumin: 4.4 g/dL (ref 3.5–5.2)
Alkaline Phosphatase: 53 U/L (ref 39–117)
BUN: 21 mg/dL (ref 6–23)
CO2: 30 mEq/L (ref 19–32)
Calcium: 9.2 mg/dL (ref 8.4–10.5)
Chloride: 103 mEq/L (ref 96–112)
Creatinine, Ser: 0.82 mg/dL (ref 0.40–1.50)
GFR: 100.07 mL/min (ref >60.00)
Glucose, Bld: 97 mg/dL (ref 70–99)
Potassium: 5.1 mEq/L (ref 3.5–5.1)
Sodium: 138 mEq/L (ref 135–145)
Total Bilirubin: 0.6 mg/dL (ref 0.2–1.2)
Total Protein: 6.8 g/dL (ref 6.0–8.3)

## 2017-06-29 LAB — LIPID PANEL
Cholesterol: 173 mg/dL (ref 0–200)
HDL: 51.8 mg/dL (ref >39.00)
LDL Cholesterol: 103 mg/dL — ABNORMAL HIGH (ref 0–99)
NonHDL: 120.92
Total CHOL/HDL Ratio: 3
Triglycerides: 89 mg/dL (ref 0.0–149.0)
VLDL: 17.8 mg/dL (ref 0.0–40.0)

## 2017-06-29 LAB — PSA: PSA: 0.58 ng/mL (ref 0.10–4.00)

## 2017-06-29 MED ORDER — HYDROCODONE-ACETAMINOPHEN 7.5-325 MG PO TABS: 1.0000 | ORAL_TABLET | Freq: Four times a day (QID) | ORAL | 0 refills | Status: DC | PRN

## 2017-06-29 MED ORDER — GABAPENTIN 100 MG PO CAPS: 200.0000 mg | ORAL_CAPSULE | Freq: Every day | ORAL | 3 refills | Status: DC

## 2017-06-29 MED ORDER — CYCLOBENZAPRINE HCL 10 MG PO TABS: 10.0000 mg | ORAL_TABLET | Freq: Three times a day (TID) | ORAL | 3 refills | Status: DC | PRN

## 2017-06-29 MED ORDER — MELOXICAM 15 MG PO TABS: ORAL_TABLET | ORAL | 3 refills | Status: DC

## 2017-06-29 NOTE — Progress Notes (Deleted)
Jonathan Beasley is a 66 y.o. male is here to [facility].   Patient Care Team: Briscoe Deutscher, DO as PCP - General (Family Medicine)   History of Present Illness:   Jonathan Beasley, CMA, acting as scribe for Dr. Juleen China.  HPI:  Health Maintenance Due  Topic Date Due  . Hepatitis C Screening  05-Feb-1951  . HIV Screening  12/31/1966  . COLONOSCOPY  12/31/2001  . TETANUS/TDAP  06/17/2013  . PNA vac Low Risk Adult (1 of 2 - PCV13) 12/31/2016  . INFLUENZA VACCINE  06/15/2017   Depression screen PHQ 2/9 06/29/2017  Decreased Interest 0  Down, Depressed, Hopeless 0  PHQ - 2 Score 0   PMHx, SurgHx, SocialHx, Medications, and Allergies were reviewed in the Visit Navigator and updated as appropriate.   Past Medical History:  Diagnosis Date  . Allergy   . Arthritis   . Hyperlipidemia   . Restless legs     Past Surgical History:  Procedure Laterality Date  . KNEE ARTHROSCOPY Right 2013    Family History  Problem Relation Age of Onset  . Heart disease Father   . Heart disease Maternal Grandfather   . Heart disease Paternal Grandfather    Social History  Substance Use Topics  . Smoking status: Never Smoker  . Smokeless tobacco: Never Used  . Alcohol use No    Current Medications and Allergies:   Current Outpatient Prescriptions:  .  Albuterol Sulfate (PROAIR RESPICLICK) 659 (90 Base) MCG/ACT AEPB, Inhale 2 puffs into the lungs every 6 (six) hours as needed., Disp: 1 each, Rfl: 11 .  aspirin 81 MG tablet, Take 81 mg by mouth daily., Disp: , Rfl:  .  Coenzyme Q10-Levocarnitine (CO Q-10 PLUS PO), Take 10 mg by mouth daily., Disp: , Rfl:  .  cyclobenzaprine (FLEXERIL) 10 MG tablet, Take 1 tablet (10 mg total) by mouth 3 (three) times daily as needed., Disp: 90 tablet, Rfl: 3 .  gabapentin (NEURONTIN) 100 MG capsule, Take 2 capsules (200 mg total) by mouth at bedtime., Disp: 60 capsule, Rfl: 3 .  gabapentin (NEURONTIN) 100 MG capsule, TAKE 2 CAPSULES (200 MG TOTAL) BY  MOUTH AT BEDTIME., Disp: 60 capsule, Rfl: 1 .  HYDROcodone-acetaminophen (NORCO) 7.5-325 MG tablet, Take 1 tablet by mouth every 6 (six) hours as needed for moderate pain., Disp: 60 tablet, Rfl: 0 .  meloxicam (MOBIC) 15 MG tablet, TAKE ONE TABLET BY MOUTH AS NEEDED FOR PAIN, Disp: 90 tablet, Rfl: 3 .  Multiple Vitamin (MULTIVITAMIN) tablet, Take 1 tablet by mouth daily., Disp: , Rfl:  .  niacin 500 MG tablet, Take 1 tablet (500 mg total) by mouth at bedtime., Disp: 90 tablet, Rfl: 3 .  nitroGLYCERIN (NITRODUR - DOSED IN MG/24 HR) 0.2 mg/hr patch, APPLY 1/4 PATCH ONTO THE SKIN ONCE DAILY, Disp: 30 patch, Rfl: 0 .  vitamin C (ASCORBIC ACID) 500 MG tablet, Take 500 mg by mouth daily., Disp: , Rfl:    Allergies  Allergen Reactions  . Penicillins Other (See Comments)    Childhood- "deathly ill"   Review of Systems:   Pertinent items are noted in the HPI. Otherwise, ROS is negative.  Vitals:   Vitals:   06/29/17 0821  BP: 132/82  Pulse: (!) 56  Temp: 97.7 F (36.5 C)  TempSrc: Oral  SpO2: 98%  Weight: 163 lb 9.6 oz (74.2 kg)  Height: 5\' 11"  (1.803 m)     Body mass index is 22.82 kg/m. Physical Exam:  Physical Exam  Results for orders placed or performed in visit on 48/27/07  Basic metabolic panel  Result Value Ref Range   Sodium 138 135 - 146 mmol/L   Potassium 4.5 3.5 - 5.3 mmol/L   Chloride 103 98 - 110 mmol/L   CO2 24 20 - 31 mmol/L   Glucose, Bld 94 65 - 99 mg/dL   BUN 24 7 - 25 mg/dL   Creat 0.98 0.70 - 1.25 mg/dL   Calcium 9.6 8.6 - 10.3 mg/dL  Lipid panel  Result Value Ref Range   Cholesterol 176 125 - 200 mg/dL   Triglycerides 66 <150 mg/dL   HDL 58 >=40 mg/dL   Total CHOL/HDL Ratio 3.0 <=5.0 Ratio   VLDL 13 <30 mg/dL   LDL Cholesterol 105 <130 mg/dL  TSH  Result Value Ref Range   TSH 1.61 0.40 - 4.50 mIU/L  PSA  Result Value Ref Range   PSA 0.7 <=4.0 ng/mL  POCT CBC  Result Value Ref Range   WBC 6.8 4.6 - 10.2 K/uL   Lymph, poc 1.8 0.6 - 3.4    POC LYMPH PERCENT 26.1 10 - 50 %L   MID (cbc) 0.5 0 - 0.9   POC MID % 7.5 0 - 12 %M   POC Granulocyte 4.5 2 - 6.9   Granulocyte percent 66.4 37 - 80 %G   RBC 5.24 4.69 - 6.13 M/uL   Hemoglobin 16.2 14.1 - 18.1 g/dL   HCT, POC 46.3 43.5 - 53.7 %   MCV 88.2 80 - 97 fL   MCH, POC 30.8 27 - 31.2 pg   MCHC 35.0 31.8 - 35.4 g/dL   RDW, POC 12.6 %   Platelet Count, POC 214 142 - 424 K/uL   MPV 7.7 0 - 99.8 fL  POCT urinalysis dipstick  Result Value Ref Range   Color, UA yellow yellow   Clarity, UA clear clear   Glucose, UA negative negative   Bilirubin, UA negative negative   Ketones, POC UA negative negative   Spec Grav, UA 1.025    Blood, UA negative negative   pH, UA 5.5    Protein Ur, POC negative negative   Urobilinogen, UA 0.2    Nitrite, UA Negative Negative   Leukocytes, UA Negative Negative  POCT glycosylated hemoglobin (Hb A1C)  Result Value Ref Range   Hemoglobin A1C 5.5     Assessment and Plan:   ***

## 2017-06-29 NOTE — Progress Notes (Signed)
Subjective:   Water quality scientist, CMA, acting as scribe for Dr. Juleen China.  Jonathan Beasley is a 66 y.o. male and is here for a comprehensive physical exam.  HPI: No concerns today. Reviewed PMHx together. Very active. Hx of polyarthralgia. Statin intolerant. Healthy diet.   Patient is an avid runner and has had to deal with multiple injuries over the years. He has chronic knee pain but he continues to run and compete. He has intermittent back pain for which we have prescribed hydrocodone, meloxicam, and cyclobenzaprine which he takes sparingly. He also has exercise-induced asthma for which he takes an inhaler.  Health Maintenance Due  Topic Date Due  . Hepatitis C Screening  [birth year]  . HIV Screening  12/31/1966  . COLONOSCOPY  12/31/2001  . TETANUS/TDAP  06/17/2013  . PNA vac Low Risk Adult (1 of 2 - PCV13) 12/31/2016  . INFLUENZA VACCINE  06/15/2017   PMHx, SurgHx, SocialHx, Medications, and Allergies were reviewed in the Visit Navigator and updated as appropriate.   Past Medical History:  Diagnosis Date  . Achilles tendonitis 07/26/2013  . Allergy   . Arthritis   . Hyperlipidemia   . Loose body in knee 06/02/2010  . Mallet finger 08/03/2011  . Restless legs   . Tear of right hamstring 03/01/2017   Past Surgical History:  Procedure Laterality Date  . KNEE ARTHROSCOPY Right 2013   Family History  Problem Relation Age of Onset  . Heart disease Father   . Heart disease Maternal Grandfather   . Heart disease Paternal Grandfather    Social History  Substance Use Topics  . Smoking status: Never Smoker  . Smokeless tobacco: Never Used  . Alcohol use No   Review of Systems:   Pertinent items are noted in the HPI. Otherwise, ROS is negative.  Objective:    Vitals:   06/29/17 0821  BP: 132/82  Pulse: (!) 56  Temp: 97.7 F (36.5 C)  SpO2: 98%   Body mass index is 22.82 kg/m.  General  Alert, cooperative, no distress, appears stated age  Head:  Normocephalic,  without obvious abnormality, atraumatic  Eyes:  PERRL, conjunctiva/corneas clear, EOM's intact, fundi benign, both eyes       Ears:  Normal TM's and external ear canals, both ears  Nose: Nares normal, septum midline, mucosa normal, no drainage or sinus tenderness  Throat: Lips, mucosa, and tongue normal; teeth and gums normal  Neck: Supple, symmetrical, trachea midline, no adenopathy; thyroid: no enlargement/tenderness/nodules; no carotid bruit or JVD  Back:   Symmetric, no curvature, ROM normal, no CVA tenderness  Lungs:   Clear to auscultation bilaterally, respirations unlabored  Chest Wall:  No tenderness or deformity  Heart:  Regular rate and rhythm, S1 and S2 normal, no murmur, rub or gallop  Abdomen:   Soft, non-tender, bowel sounds active all four quadrants, no masses, no organomegaly  Extremities: Extremities normal, atraumatic, no cyanosis or edema  Prostate : Not done  Skin: Skin color, texture, turgor normal, no rashes or lesions  Lymph: Cervical, supraclavicular, and axillary nodes normal  Neurologic: CNII-XII grossly intact. Normal strength, sensation and reflexes throughout   AssessmentPlan:   Alvie was seen today for annual exam.  Diagnoses and all orders for this visit:  Routine physical examination -     Lipid panel -     Comprehensive metabolic panel  Right hip pain Comments: Chronic. Medications used prn. Refill today. Orders: -     HYDROcodone-acetaminophen (NORCO) 7.5-325 MG tablet; Take  1 tablet by mouth every 6 (six) hours as needed for moderate pain.  Screening PSA (prostate specific antigen) -     PSA  Restless legs -     gabapentin (NEURONTIN) 100 MG capsule; Take 2 capsules (200 mg total) by mouth at bedtime.  Arthritis -     meloxicam (MOBIC) 15 MG tablet; TAKE ONE TABLET BY MOUTH AS NEEDED FOR PAIN -     cyclobenzaprine (FLEXERIL) 10 MG tablet; Take 1 tablet (10 mg total) by mouth 3 (three) times daily as needed.  Patient Counseling: [x]    Nutrition: Stressed importance of moderation in sodium/caffeine intake, saturated fat and cholesterol, caloric balance, sufficient intake of fresh fruits, vegetables, and fiber  [x]   Stressed the importance of regular exercise.   []   Substance Abuse: Discussed cessation/primary prevention of tobacco, alcohol, or other drug use; driving or other dangerous activities under the influence; availability of treatment for abuse.   [x]   Injury prevention: Discussed safety belts, safety helmets, smoke detector, smoking near bedding or upholstery.   []   Sexuality: Discussed sexually transmitted diseases, partner selection, use of condoms, avoidance of unintended pregnancy  and contraceptive alternatives.   [x]   Dental health: Discussed importance of regular tooth brushing, flossing, and dental visits.  [x]   Health maintenance and immunizations reviewed. Please refer to Health maintenance section.   Briscoe Deutscher, DO [facility] served as Education administrator during this visit. History, Physical, and Plan performed by medical provider. The above documentation has been reviewed and is accurate and complete. Briscoe Deutscher, D.O.

## 2017-07-01 ENCOUNTER — Encounter: Payer: Self-pay | Admitting: Family Medicine

## 2017-07-01 DIAGNOSIS — M1990 Unspecified osteoarthritis, unspecified site: Secondary | ICD-10-CM | POA: Insufficient documentation

## 2017-07-01 DIAGNOSIS — G2581 Restless legs syndrome: Secondary | ICD-10-CM | POA: Insufficient documentation

## 2017-08-17 ENCOUNTER — Other Ambulatory Visit: Payer: Self-pay | Admitting: *Deleted

## 2017-08-17 DIAGNOSIS — G2581 Restless legs syndrome: Secondary | ICD-10-CM

## 2017-08-17 MED ORDER — GABAPENTIN 100 MG PO CAPS: 200.0000 mg | ORAL_CAPSULE | Freq: Every day | ORAL | 1 refills | Status: DC

## 2017-08-17 NOTE — Telephone Encounter (Signed)
Refill done.  

## 2017-08-29 ENCOUNTER — Ambulatory Visit (INDEPENDENT_AMBULATORY_CARE_PROVIDER_SITE_OTHER): Payer: 59 | Admitting: Sports Medicine

## 2017-08-29 ENCOUNTER — Ambulatory Visit: Payer: Self-pay

## 2017-08-29 ENCOUNTER — Encounter: Payer: Self-pay | Admitting: Sports Medicine

## 2017-08-29 ENCOUNTER — Other Ambulatory Visit: Payer: Self-pay | Admitting: Surgical

## 2017-08-29 VITALS — BP 156/80 | HR 71 | Ht 71.0 in | Wt 160.2 lb

## 2017-08-29 DIAGNOSIS — R0600 Dyspnea, unspecified: Secondary | ICD-10-CM

## 2017-08-29 DIAGNOSIS — M79605 Pain in left leg: Secondary | ICD-10-CM | POA: Diagnosis not present

## 2017-08-29 DIAGNOSIS — M47816 Spondylosis without myelopathy or radiculopathy, lumbar region: Secondary | ICD-10-CM | POA: Diagnosis not present

## 2017-08-29 DIAGNOSIS — M25552 Pain in left hip: Secondary | ICD-10-CM

## 2017-08-29 DIAGNOSIS — M25551 Pain in right hip: Secondary | ICD-10-CM

## 2017-08-29 DIAGNOSIS — J683 Other acute and subacute respiratory conditions due to chemicals, gases, fumes and vapors: Secondary | ICD-10-CM

## 2017-08-29 MED ORDER — GABAPENTIN 300 MG PO CAPS: 300.0000 mg | ORAL_CAPSULE | Freq: Every day | ORAL | 2 refills | Status: DC

## 2017-08-29 MED ORDER — HYDROCODONE-ACETAMINOPHEN 7.5-325 MG PO TABS: 1.0000 | ORAL_TABLET | Freq: Four times a day (QID) | ORAL | 0 refills | Status: DC | PRN

## 2017-08-29 MED ORDER — ALBUTEROL SULFATE 108 (90 BASE) MCG/ACT IN AEPB: 2.0000 | INHALATION_SPRAY | Freq: Four times a day (QID) | RESPIRATORY_TRACT | 11 refills | Status: DC | PRN

## 2017-08-29 MED ORDER — METHYLPREDNISOLONE 4 MG PO TBPK: ORAL_TABLET | ORAL | 0 refills | Status: DC

## 2017-08-29 NOTE — Progress Notes (Signed)
OFFICE VISIT NOTE Jonathan Beasley. Jonathan Beasley, [place] [facility] [place] - 66 y.o. male MRN [id number]  Date of birth: Jan 03, 1951  Visit Date: 08/29/2017  PCP: Briscoe Deutscher, DO   Referred by: Briscoe Deutscher, DO  Burlene Arnt, CMA acting as scribe for Dr. Paulla Fore.  SUBJECTIVE:   Chief Complaint  Patient presents with  . LT leg pain   HPI: As below and per problem based documentation when appropriate.  Jonathan Beasley is an established patient with Dr. Tamala Julian presenting today for evaluation of LT leg pain. He last saw Dr. Tamala Julian 04/25/2017 for RT leg pain and was advised to continue icing regimen as well as Nitro Protocol. He is prescribed Hydrocodone, Meloxicam, and Cyclobenzaprine by Dr. Juleen China for management of back pain.   Pain has been present x 2 weeks Pt was doing manual labor which caused inflammation on the back of his upper leg. After that he continued running which caused the pain to worsen. He is currently training for a race which is in 3 weeks.  The pain is described as aching and tightness and is rated as 10/10 at night but 5-9/10 during the day.  Worsened with sitting, walking. There is no particular movement or position that seems to help alleviate pain.  Therapies tried include : He has been doing dry needling and using Nitroglycerin patches with some relief.  He has been icing the area with some relief.   Other associated symptoms include: Pain does radiate at night and after working out.     ROS  Otherwise per HPI.    HISTORY & PERTINENT PRIOR DATA:  Prior History reviewed and updated per electronic medical record.  Significant history, findings, studies and interim changes include:  reports that  has never smoked. he has never used smokeless tobacco. No results for input(s): HGBA1C, LABURIC, CREATINE in the last 8760 hours. No specialty comments available. Problem  Left Leg Pain     OBJECTIVE:   VS:  HT:5\' 11"  (180.3 cm)   WT:160 lb 3.2 oz (72.7 kg)  BMI:22.35    BP:(!) 156/80  HR:71bpm  TEMP: ( )  RESP:97 %  PHYSICAL EXAM: Constitutional: WDWN, Non-toxic appearing. Psychiatric: Alert & appropriately interactive. Not depressed or anxious appearing. Respiratory: No increased work of breathing. Trachea Midline Eyes: Pupils are equal. EOM intact without nystagmus. No scleral icterus Cardiovascular:  Peripheral Pulses: peripheral pulses symmetrical No clubbing or cyanosis appreciated Capillary Refill is normal, less than 2 seconds No signficant generalized edema/anasarca Sensory Exam: intact to light touch   No additional findings.   ASSESSMENT & PLAN:   1. Left leg pain   2. Lumbar spondylosis   3. Pain of left hip joint    PLAN:    Left leg pain Left hamstring strain that has incompletely healed in the past.  No evidence of avulsion on the ultrasound.  There is likely a potential neurogenic component to this and start him on a Medrol Dosepak and gabapentin to see if this is beneficial.  We will plan to have him begin working on therapeutic exercises at follow-up at this time relative rest is recommended.  Referral to physical therapy discussed.  ++++++++++++++++++++++++++++++++++++++++++++++++++++++++++++++++++ LIMITED MSK ULTRASOUND OF left hip and hamstring Images were obtained and interpreted by myself, Teresa Coombs, DO  Images have been saved and stored to PACS system. Images obtained on: GE S7 Ultrasound machine  FINDINGS:   Medial hamstring origin has disruption of  the proximal fibers and a small amount of interstitial edema.  There is a small amount of increased neovascularity but this is mild.  Some generalized guarding appreciated.  IMPRESSION:  1. Acute on chronic proximal hamstring strain, grade 2     ++++++++++++++++++++++++++++++++++++++++++++ Orders & Meds: Orders Placed This Encounter  Procedures  . [place] LIMITED JOINT SPACE STRUCTURES LOW  LEFT(NO LINKED CHARGES)    Meds ordered this encounter  Medications  . gabapentin (NEURONTIN) 300 MG capsule    Sig: Take 1 capsule (300 mg total) by mouth at bedtime.    Dispense:  90 capsule    Refill:  2  . DISCONTD: methylPREDNISolone (MEDROL DOSEPAK) 4 MG TBPK tablet    Sig: Take by mouth as directed. Take 6 tablets on the first day prescribed then as directed.    Dispense:  21 tablet    Refill:  0    ++++++++++++++++++++++++++++++++++++++++++++ Follow-up: Return in about 2 weeks (around 09/12/2017).   Pertinent documentation may be included in additional procedure notes, imaging studies, problem based documentation and patient instructions. Please see these sections of the encounter for additional information regarding this visit. CMA/ATC served as Education administrator during this visit. History, Physical, and Plan performed by medical provider. Documentation and orders reviewed and attested to.      [name], [facility] Physician

## 2017-08-29 NOTE — Patient Instructions (Signed)

## 2017-08-31 ENCOUNTER — Encounter: Payer: Self-pay | Admitting: Sports Medicine

## 2017-09-01 ENCOUNTER — Other Ambulatory Visit: Payer: Self-pay

## 2017-09-01 DIAGNOSIS — M25552 Pain in left hip: Secondary | ICD-10-CM

## 2017-09-01 DIAGNOSIS — M47816 Spondylosis without myelopathy or radiculopathy, lumbar region: Secondary | ICD-10-CM

## 2017-09-12 ENCOUNTER — Ambulatory Visit: Payer: 59 | Admitting: Sports Medicine

## 2017-09-29 ENCOUNTER — Encounter: Payer: Self-pay | Admitting: Sports Medicine

## 2017-09-29 ENCOUNTER — Ambulatory Visit: Payer: 59 | Admitting: Sports Medicine

## 2017-09-29 VITALS — BP 112/72 | HR 58 | Ht 71.0 in | Wt 162.6 lb

## 2017-09-29 DIAGNOSIS — M47816 Spondylosis without myelopathy or radiculopathy, lumbar region: Secondary | ICD-10-CM

## 2017-09-29 DIAGNOSIS — Z23 Encounter for immunization: Secondary | ICD-10-CM | POA: Diagnosis not present

## 2017-09-29 DIAGNOSIS — M25551 Pain in right hip: Secondary | ICD-10-CM | POA: Diagnosis not present

## 2017-09-29 DIAGNOSIS — M79605 Pain in left leg: Secondary | ICD-10-CM | POA: Diagnosis not present

## 2017-09-29 MED ORDER — PREGABALIN 75 MG PO CAPS: 75.0000 mg | ORAL_CAPSULE | Freq: Two times a day (BID) | ORAL | 1 refills | Status: DC

## 2017-09-29 MED ORDER — HYDROCODONE-ACETAMINOPHEN 7.5-325 MG PO TABS: 1.0000 | ORAL_TABLET | Freq: Four times a day (QID) | ORAL | 0 refills | Status: DC | PRN

## 2017-09-29 NOTE — Progress Notes (Signed)
OFFICE VISIT NOTE Jonathan Beasley. Jonathan Beasley, [place] at [place] - 66 y.o. male MRN [id number]  Date of birth: 1950/11/27  Visit Date: 09/29/2017  PCP: Briscoe Deutscher, DO   Referred by: Briscoe Deutscher, DO  Jonathan Beasley, CMA acting as scribe for Dr. Paulla Fore.  SUBJECTIVE:   Chief Complaint  Patient presents with  . Follow-up    LT leg pain   HPI: As below and per problem based documentation when appropriate.  [name] is an established patient presenting today in follow-up of LT leg and hip pain. He was last seen 08/29/2017 and was prescribed Medrol dosepak and Gabapentin 300 mg qhs. He also had u/s done 08/29/17. He was provided with link for [facility] exercises.   Pain flared up this past Tuesday after a race. He got stuck in the mud and when he slipped he felt a pulling sensation in the gluteal region. The pain was so bad after the race that someone had to help him to the car. He has been walking with a limp. The pain is worse after sitting for about 10-15 minutes. He did get some relief the last time this happened after the Medrol Dosepak. He has stopped doing squats after his last visit because that seems to cause the pain to flare-up. No has not taken any OTC meds for the pain. He has not done PT or dry needling for this episode.     Review of Systems  Constitutional: Negative for chills, fever and malaise/fatigue.  Respiratory: Negative for shortness of breath and wheezing.   Cardiovascular: Negative for chest pain and palpitations.  Musculoskeletal: Positive for myalgias.  Neurological: Negative for dizziness, tingling, weakness and headaches.    Otherwise per HPI.  HISTORY & PERTINENT PRIOR DATA:  Prior History reviewed and updated per electronic medical record. Significant history, findings, studies and interim changes include: No additional findings.  reports that  has never smoked. he has never  used smokeless tobacco. No results for input(s): HGBA1C, LABURIC, CREATINE in the last 8760 hours. Problem  Left Leg Pain    OBJECTIVE:  VS:  HT:5\' 11"  (180.3 cm)   WT:162 lb 9.6 oz (73.8 kg)  BMI:22.69    BP:112/72  HR:(!) 58bpm  TEMP: ( )  RESP:98 %  PHYSICAL EXAM: Constitutional: WDWN, Non-toxic appearing. Psychiatric: Alert & appropriately interactive. Not depressed or anxious appearing. Respiratory: No increased work of breathing. Trachea Midline Eyes: Pupils are equal. EOM intact without nystagmus. No scleral icterus  Left leg: Overall well aligned.  He walks with an antalgic gait.  Pain with straight leg raise.  There is small amount of swelling at the origin of the hamstrings but no significant bruising or ecchymosis.  He has pain that is worse with resisted knee flexion at 30 degrees.  Lower extremity sensation is intact to light touch.  ASSESSMENT & PLAN:   1. Need for influenza vaccination   2. Right hip pain   3. Lumbar spondylosis   4. Left leg pain    Plan: Symptoms are consistent with a hamstring injury.  Recommend avoidance behaviors.  Given the significant pain that he is having and likely possible neurogenic component of the injury mechanism I would like to have him begin on Lyrica and provided him a refill of pain medication.  Dr. Juleen China is out of the office at this time given the upcoming holiday we will go ahead and refill this for him but  discussed that this will need to come from her going forward.  Information regarding PRP provided today to consider if any lack of improvement.  Given the time away from activity he would like to delay this.   ++++++++++++++++++++++++++++++++++++++++++++ Orders:  Orders Placed This Encounter  Procedures  . Flu vaccine HIGH DOSE PF (Fluzone High dose)    Meds:  Meds ordered this encounter  Medications  . DISCONTD: HYDROcodone-acetaminophen (NORCO) 7.5-325 MG tablet    Sig: Take 1 tablet every 6 (six) hours as needed  by mouth for moderate pain.    Dispense:  60 tablet    Refill:  0  . DISCONTD: pregabalin (LYRICA) 75 MG capsule    Sig: Take 1 capsule (75 mg total) 2 (two) times daily by mouth.    Dispense:  60 capsule    Refill:  1  . pregabalin (LYRICA) 75 MG capsule    Sig: Take 1 capsule (75 mg total) 2 (two) times daily by mouth.    Dispense:  60 capsule    Refill:  1  . HYDROcodone-acetaminophen (NORCO) 7.5-325 MG tablet    Sig: Take 1 tablet every 6 (six) hours as needed by mouth for moderate pain.    Dispense:  60 tablet    Refill:  0    ++++++++++++++++++++++++++++++++++++++++++++ Follow-up: Return in about 4 weeks (around 10/27/2017).   Pertinent documentation may be included in additional procedure notes, imaging studies, problem based documentation and patient instructions. Please see these sections of the encounter for additional information regarding this visit. CMA/ATC served as Education administrator during this visit. History, Physical, and Plan performed by medical provider. Documentation and orders reviewed and attested to.      [name], [place] [facility] Physician

## 2017-09-29 NOTE — Patient Instructions (Addendum)
Try to see if you can get the Lyrica Filled. If you do, you can stop the Gabapentin.  Otherwise I can send in a new refill for the 300mg  Gabapentin  Keep Icing    We are going to be setting you up to perform a PRP (platelet rich plasma) injection. The cost of this is $495 out of pocket.  We will plan to have you come back at your convenience.  You will need to be off of anti-inflammatories for approximately 2 weeks prior to the injection and continue to avoid these medications (ibuprofen, Aleeve(naproxen), Meloxicam(mobic), Voltaren(diclofenac) during the initial 6 weeks following your injection. It is okay to take Tylenol if needed.  Please go ahead and fill your tramadol prescription so you have it available for after the procedure.

## 2017-10-04 DIAGNOSIS — M47896 Other spondylosis, lumbar region: Secondary | ICD-10-CM | POA: Diagnosis not present

## 2017-10-04 DIAGNOSIS — M25552 Pain in left hip: Secondary | ICD-10-CM | POA: Diagnosis not present

## 2017-10-04 NOTE — Telephone Encounter (Signed)
Referral re-faxed to [facility] at [phone].

## 2017-10-04 NOTE — Telephone Encounter (Signed)
[facility] calling in reference to notes below. Office needs referral sent over again. Office can not locate referral that was faxed over. Please fax over referral again to [phone].

## 2017-10-10 ENCOUNTER — Encounter: Payer: Self-pay | Admitting: Sports Medicine

## 2017-10-10 DIAGNOSIS — M79605 Pain in left leg: Secondary | ICD-10-CM

## 2017-10-10 DIAGNOSIS — M47816 Spondylosis without myelopathy or radiculopathy, lumbar region: Secondary | ICD-10-CM

## 2017-10-10 MED ORDER — DEXAMETHASONE SODIUM PHOSPHATE 4 MG/ML IJ SOLN: INTRAMUSCULAR | 1 refills | Status: DC

## 2017-10-11 DIAGNOSIS — M47896 Other spondylosis, lumbar region: Secondary | ICD-10-CM | POA: Diagnosis not present

## 2017-10-11 DIAGNOSIS — M25552 Pain in left hip: Secondary | ICD-10-CM | POA: Diagnosis not present

## 2017-10-13 DIAGNOSIS — M47896 Other spondylosis, lumbar region: Secondary | ICD-10-CM | POA: Diagnosis not present

## 2017-10-13 DIAGNOSIS — M25552 Pain in left hip: Secondary | ICD-10-CM | POA: Diagnosis not present

## 2017-10-17 NOTE — Assessment & Plan Note (Signed)
LIMITED MSK ULTRASOUND OF LEFT HAMSTRING Images were obtained and interpreted by myself, Teresa Coombs, DO  Images have been saved and stored to PACS system. Images obtained on: GE S7 Ultrasound machine  FINDINGS:   Hypoechoic change with minimal neovascularity at the origin of the left hamstring  Small amount of subcutaneous edema  Muscle fibers structure is intact.  IMPRESSION:  1. Left proximal hamstring strain

## 2017-10-18 DIAGNOSIS — M47896 Other spondylosis, lumbar region: Secondary | ICD-10-CM | POA: Diagnosis not present

## 2017-10-18 DIAGNOSIS — M25552 Pain in left hip: Secondary | ICD-10-CM | POA: Diagnosis not present

## 2017-10-20 DIAGNOSIS — M25552 Pain in left hip: Secondary | ICD-10-CM | POA: Diagnosis not present

## 2017-10-20 DIAGNOSIS — M47896 Other spondylosis, lumbar region: Secondary | ICD-10-CM | POA: Diagnosis not present

## 2017-10-25 DIAGNOSIS — M25552 Pain in left hip: Secondary | ICD-10-CM | POA: Diagnosis not present

## 2017-10-25 DIAGNOSIS — M47896 Other spondylosis, lumbar region: Secondary | ICD-10-CM | POA: Diagnosis not present

## 2017-10-26 ENCOUNTER — Encounter: Payer: Self-pay | Admitting: Sports Medicine

## 2017-10-26 NOTE — Assessment & Plan Note (Signed)
Left hamstring strain that has incompletely healed in the past.  No evidence of avulsion on the ultrasound.  There is likely a potential neurogenic component to this and start him on a Medrol Dosepak and gabapentin to see if this is beneficial.  We will plan to have him begin working on therapeutic exercises at follow-up at this time relative rest is recommended.  Referral to physical therapy discussed.  ++++++++++++++++++++++++++++++++++++++++++++++++++++++++++++++++++ LIMITED MSK ULTRASOUND OF left hip and hamstring Images were obtained and interpreted by myself, Teresa Coombs, DO  Images have been saved and stored to PACS system. Images obtained on: GE S7 Ultrasound machine  FINDINGS:   Medial hamstring origin has disruption of the proximal fibers and a small amount of interstitial edema.  There is a small amount of increased neovascularity but this is mild.  Some generalized guarding appreciated.  IMPRESSION:  1. Acute on chronic proximal hamstring strain, grade 2

## 2017-10-27 ENCOUNTER — Telehealth: Payer: 59 | Admitting: Physician Assistant

## 2017-10-27 DIAGNOSIS — M25552 Pain in left hip: Secondary | ICD-10-CM | POA: Diagnosis not present

## 2017-10-27 DIAGNOSIS — M47896 Other spondylosis, lumbar region: Secondary | ICD-10-CM | POA: Diagnosis not present

## 2017-10-27 DIAGNOSIS — J019 Acute sinusitis, unspecified: Secondary | ICD-10-CM

## 2017-10-27 DIAGNOSIS — B9689 Other specified bacterial agents as the cause of diseases classified elsewhere: Secondary | ICD-10-CM

## 2017-10-27 MED ORDER — AZITHROMYCIN 250 MG PO TABS: ORAL_TABLET | ORAL | 0 refills | Status: DC

## 2017-10-27 NOTE — Progress Notes (Signed)
We are sorry that you are not feeling well.  Here is how we plan to help!  Based on what you have shared with me it looks like you have sinusitis.  Sinusitis is inflammation and infection in the sinus cavities of the head.  Based on your presentation I believe you most likely have Acute Bacterial Sinusitis.  This is an infection caused by bacteria and is treated with antibiotics. I have prescribed a Z-pack. I typically use other antibiotics such as Doxycycline that have better coverage. Giving your comments we will start with a Z-pack (Azithromycin) but if not improving we will need to shift gears. You may use an oral decongestant such as Mucinex D or if you have glaucoma or high blood pressure use plain Mucinex. Saline nasal spray help and can safely be used as often as needed for congestion.  If you develop worsening sinus pain, fever or notice severe headache and vision changes, or if symptoms are not better after completion of antibiotic, please schedule an appointment with a health care provider.    Sinus infections are not as easily transmitted as other respiratory infection, however we still recommend that you avoid close contact with loved ones, especially the very young and elderly.  Remember to wash your hands thoroughly throughout the day as this is the number one way to prevent the spread of infection!  Home Care:  Only take medications as instructed by your medical team.  Complete the entire course of an antibiotic.  Do not take these medications with alcohol.  A steam or ultrasonic humidifier can help congestion.  You can place a towel over your head and breathe in the steam from hot water coming from a faucet.  Avoid close contacts especially the very young and the elderly.  Cover your mouth when you cough or sneeze.  Always remember to wash your hands.  Get Help Right Away If:  You develop worsening fever or sinus pain.  You develop a severe head ache or visual  changes.  Your symptoms persist after you have completed your treatment plan.  Make sure you  Understand these instructions.  Will watch your condition.  Will get help right away if you are not doing well or get worse.  Your e-visit answers were reviewed by a board certified advanced clinical practitioner to complete your personal care plan.  Depending on the condition, your plan could have included both over the counter or prescription medications.  If there is a problem please reply  once you have received a response from your provider.  Your safety is important to [place].  If you have drug allergies check your prescription carefully.    You can use MyChart to ask questions about today's visit, request a non-urgent call back, or ask for a work or school excuse for 24 hours related to this e-Visit. If it has been greater than 24 hours you will need to follow up with your provider, or enter a new e-Visit to address those concerns.  You will get an e-mail in the next two days asking about your experience.  I hope that your e-visit has been valuable and will speed your recovery. Thank you for using e-visits.

## 2017-11-01 DIAGNOSIS — M47896 Other spondylosis, lumbar region: Secondary | ICD-10-CM | POA: Diagnosis not present

## 2017-11-01 DIAGNOSIS — M25552 Pain in left hip: Secondary | ICD-10-CM | POA: Diagnosis not present

## 2017-11-03 DIAGNOSIS — M25552 Pain in left hip: Secondary | ICD-10-CM | POA: Diagnosis not present

## 2017-11-03 DIAGNOSIS — M47896 Other spondylosis, lumbar region: Secondary | ICD-10-CM | POA: Diagnosis not present

## 2017-11-10 DIAGNOSIS — M47896 Other spondylosis, lumbar region: Secondary | ICD-10-CM | POA: Diagnosis not present

## 2017-11-10 DIAGNOSIS — M25552 Pain in left hip: Secondary | ICD-10-CM | POA: Diagnosis not present

## 2017-11-25 DIAGNOSIS — M5416 Radiculopathy, lumbar region: Secondary | ICD-10-CM | POA: Diagnosis not present

## 2017-12-01 ENCOUNTER — Other Ambulatory Visit: Payer: Self-pay | Admitting: Orthopedic Surgery

## 2017-12-01 DIAGNOSIS — M545 Low back pain: Secondary | ICD-10-CM

## 2017-12-02 DIAGNOSIS — M5416 Radiculopathy, lumbar region: Secondary | ICD-10-CM | POA: Diagnosis not present

## 2017-12-09 ENCOUNTER — Ambulatory Visit
Admission: RE | Admit: 2017-12-09 | Discharge: 2017-12-09 | Disposition: A | Discharge status: Home / Self Care | Payer: 59 | Source: Ambulatory Visit | Attending: Orthopedic Surgery | Admitting: Orthopedic Surgery

## 2017-12-09 DIAGNOSIS — M47816 Spondylosis without myelopathy or radiculopathy, lumbar region: Secondary | ICD-10-CM | POA: Diagnosis not present

## 2017-12-09 DIAGNOSIS — M5126 Other intervertebral disc displacement, lumbar region: Secondary | ICD-10-CM | POA: Diagnosis not present

## 2017-12-09 DIAGNOSIS — M545 Low back pain: Secondary | ICD-10-CM

## 2017-12-12 DIAGNOSIS — M5416 Radiculopathy, lumbar region: Secondary | ICD-10-CM | POA: Diagnosis not present

## 2017-12-13 DIAGNOSIS — M5416 Radiculopathy, lumbar region: Secondary | ICD-10-CM | POA: Diagnosis not present

## 2017-12-20 DIAGNOSIS — M5416 Radiculopathy, lumbar region: Secondary | ICD-10-CM | POA: Diagnosis not present

## 2017-12-22 DIAGNOSIS — M5416 Radiculopathy, lumbar region: Secondary | ICD-10-CM | POA: Diagnosis not present

## 2017-12-27 DIAGNOSIS — M5416 Radiculopathy, lumbar region: Secondary | ICD-10-CM | POA: Diagnosis not present

## 2018-01-09 DIAGNOSIS — M545 Low back pain: Secondary | ICD-10-CM | POA: Diagnosis not present

## 2018-01-12 ENCOUNTER — Encounter: Payer: Self-pay | Admitting: Family Medicine

## 2018-01-18 DIAGNOSIS — M7072 Other bursitis of hip, left hip: Secondary | ICD-10-CM | POA: Diagnosis not present

## 2018-02-10 ENCOUNTER — Ambulatory Visit: Payer: Medicare Other | Admitting: Sports Medicine

## 2018-02-13 ENCOUNTER — Encounter: Payer: Self-pay | Admitting: Sports Medicine

## 2018-02-13 ENCOUNTER — Ambulatory Visit: Payer: Medicare Other | Admitting: Sports Medicine

## 2018-02-13 VITALS — BP 144/84 | HR 74 | Ht 71.0 in | Wt 165.6 lb

## 2018-02-13 DIAGNOSIS — M25551 Pain in right hip: Secondary | ICD-10-CM | POA: Insufficient documentation

## 2018-02-13 DIAGNOSIS — M7072 Other bursitis of hip, left hip: Secondary | ICD-10-CM | POA: Diagnosis not present

## 2018-02-13 DIAGNOSIS — M79605 Pain in left leg: Secondary | ICD-10-CM | POA: Diagnosis not present

## 2018-02-13 DIAGNOSIS — M25552 Pain in left hip: Secondary | ICD-10-CM

## 2018-02-13 DIAGNOSIS — M47816 Spondylosis without myelopathy or radiculopathy, lumbar region: Secondary | ICD-10-CM | POA: Diagnosis not present

## 2018-02-13 MED ORDER — HYDROCODONE-ACETAMINOPHEN 7.5-325 MG PO TABS: 1.0000 | ORAL_TABLET | Freq: Four times a day (QID) | ORAL | 0 refills | Status: DC | PRN

## 2018-02-13 NOTE — Progress Notes (Signed)
Jonathan Beasley, Jonathan Beasley  Jonathan Beasley - 67 y.o. male MRN [id number]  Date of birth: [birth year]  Visit Date: 02/13/2018  PCP: Jonathan Deutscher, DO   Referred by: Jonathan Deutscher, DO  Scribe for today's visit: Jonathan Beasley, CMA     SUBJECTIVE:  Jonathan Coup "[name]" is here for Follow-up (L leg pain)  09/29/17: Jonathan Beasley is an established patient presenting today in follow-up of LT leg and hip pain. He was last seen 08/29/2017 and was prescribed Medrol dosepak and Gabapentin 300 mg qhs. He also had u/s done 08/29/17. He was provided with link for [facility] exercises.  Pain flared up this past Tuesday after a race. He got stuck in the mud and when he slipped he felt a pulling sensation in the gluteal region. The pain was so bad after the race that someone had to help him to the car. He has been walking with a limp. The pain is worse after sitting for about 10-15 minutes. He did get some relief the last time this happened after the Medrol Dosepak. He has stopped doing squats after his last visit because that seems to cause the pain to flare-up. No has not taken any OTC meds for the pain. He has not done PT or dry needling fort this episode.   02/13/2018: Compared to the last office visit, his previously described symptoms are improving. The pain isn't shooting down his leg anymore but he still has a lot of pain when sitting.  Current symptoms are moderate & are radiating to the L hamstring.  He has been taking Gabapentin for restless leg syndrome. He has Hydrocodone but he only takes it as needed. He has Cyclobenzaprine to take as needed but he isn't convinced that this is a muscular issue. He has started using Nitroglycerin patches again.   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   ROS Reports night time disturbances. Denies fevers, chills, or night sweats. Denies unexplained weight loss. Denies personal history of cancer. Denies changes in bowel or bladder habits. Denies recent unreported falls. Denies new or worsening dyspnea or wheezing. Denies headaches or dizziness.  Reports numbness, tingling or weakness  In the extremities.  Denies dizziness or presyncopal episodes Denies lower extremity edema    HISTORY & PERTINENT PRIOR DATA:  Prior History reviewed and updated per electronic medical record.  Significant/pertinent history, findings, studies include:  reports that he has never smoked. He has never used smokeless tobacco. No results for input(s): HGBA1C, LABURIC, CREATINE in the last 8760 hours. No specialty comments available. Problem  Ischial Bursitis of Left Side  Right Hip Pain   Chronic. Medications used prn. Refill today.     OBJECTIVE:  VS:  HT:5\' 11"  (180.3 cm)   WT:165 lb 9.6 oz (75.1 kg)  BMI:23.11    BP:(Abnormal) 144/84  HR:74bpm  TEMP: ( )  RESP:96 %   PHYSICAL EXAM: Constitutional: WDWN, Non-toxic appearing. Psychiatric: Alert & appropriately interactive.  Not depressed or anxious appearing. Respiratory: No increased work of breathing.  Trachea Midline Eyes: Pupils are equal.  EOM intact without nystagmus.  No scleral icterus  Vascular Exam: warm to touch no edema  lower extremity neuro exam: unremarkable normal strength normal sensation normal reflexes  MSK Exam: Pain  with straight leg raise but does not cause any radicular symptoms.  He has pain with sitting and is quite uncomfortable during the entire exam and his sister and his he does this on a normal basis.  Walks with a slight antalgic gait.   ASSESSMENT & PLAN:   1. Lumbar spondylosis   2. Left leg pain   3. Pain in joint of left hip   4. Right hip pain   5. Ischial bursitis of left side     PLAN: Symptoms have  improved following initial bursa injection as an outside office.  He is interested in potentially repeating this versus PRP in the merits of this were discussed.  We will continue with iontophoresis with physical therapy and will look into [facility] Therapy with Dr. Daron Offer.  We will plan to see him back in 2 weeks to perform a repeat corticosteroid injection unless he call sooner to schedule for PRP.  We did discuss about using a Body Helix Compression Sleeve compression sleeve for his left hip as it does seem as though he has a possibly traumatic ischial bursitis that may be coming from bowstringing due to neuromuscular impingement of the left hamstring.  Given the lack of improvement however with the epidural steroid injection I do agree with his son who is a physical therapist that this is less likely a true neurogenic issue and more of a soft tissue issue especially given the lack of symptoms radiating down past the knee. We did discuss the risks and benefits of repeat cortisone injection including risk of tendon rupture and he is aware of this risk.  Pain medication prescription refilled today given only intermittent use.  Previously searched the [place] with appropriate use.  >50% of this 25 minute visit spent in direct patient counseling and/or coordination of care.  Discussion was focused on education regarding the in discussing the pathoetiology and anticipated clinical course of the above condition.  Follow-up: Return in about 2 weeks (around 02/27/2018).      Please see additional documentation for Objective, Assessment and Plan sections. Pertinent additional documentation may be included in corresponding procedure notes, imaging studies, problem based documentation and patient instructions. Please see these sections of the encounter for additional information regarding this visit.  CMA/ATC served as Education administrator during this visit. History, Physical, and Plan performed by medical provider.  Documentation and orders reviewed and attested to.      Jonathan Beasley, [place] [facility] Physician

## 2018-03-14 ENCOUNTER — Encounter: Payer: Self-pay | Admitting: Sports Medicine

## 2018-03-14 ENCOUNTER — Ambulatory Visit: Payer: Self-pay

## 2018-03-14 ENCOUNTER — Ambulatory Visit: Payer: Medicare Other | Admitting: Sports Medicine

## 2018-03-14 VITALS — BP 150/86 | HR 59 | Ht 71.0 in | Wt 167.2 lb

## 2018-03-14 DIAGNOSIS — M7072 Other bursitis of hip, left hip: Secondary | ICD-10-CM

## 2018-03-14 DIAGNOSIS — M25551 Pain in right hip: Secondary | ICD-10-CM

## 2018-03-14 DIAGNOSIS — M79605 Pain in left leg: Secondary | ICD-10-CM

## 2018-03-14 MED ORDER — DEXAMETHASONE SODIUM PHOSPHATE 4 MG/ML IJ SOLN: INTRAMUSCULAR | 1 refills | Status: DC

## 2018-03-14 MED ORDER — HYDROCODONE-ACETAMINOPHEN 7.5-325 MG PO TABS: 1.0000 | ORAL_TABLET | Freq: Four times a day (QID) | ORAL | 0 refills | Status: DC | PRN

## 2018-03-14 MED ORDER — DEXAMETHASONE SODIUM PHOSPHATE 120 MG/30ML IJ SOLN: INTRAMUSCULAR | 0 refills | Status: DC

## 2018-03-14 NOTE — Progress Notes (Signed)
LIMITED MSK ULTRASOUND OF Left hamstring Images were obtained and interpreted by myself, Teresa Coombs, DO  Images have been saved and stored to PACS system. Images obtained on: GE S7 Ultrasound machine  FINDINGS:   Proximal hamstring reveals a calcific change within the origin of the lateral hamstring musculature.  There is Tendinopathic thickening as well but more focally intratendinous calcium consistent with calcific tendinitis.  IMPRESSION:  1. Calcific tendinitis/tendinopathy of the proximal hamstring.

## 2018-03-14 NOTE — Progress Notes (Signed)
Jonathan Beasley. [place], Jonathan Beasley at [facility] [phone]  Jonathan Beasley - 67 y.o. male MRN [id number]  Date of birth: 01-26-1951  Visit Date: 03/14/2018  PCP: Briscoe Deutscher, DO   Referred by: Briscoe Deutscher, DO  Scribe for today's visit: Jonathan Beasley, CMA     SUBJECTIVE:  Jonathan Coup "[name]" is here for Follow-up (back pain)  09/29/17: Jonathan Beasley is an established patient presenting today in follow-up of LT leg and hip pain. He was last seen 08/29/2017 and was prescribed Medrol dosepak and Gabapentin 300 mg qhs. He also had u/s done 08/29/17. He was provided with link for [facility] exercises.  Pain flared up this past Tuesday after a race. He got stuck in the mud and when he slipped he felt a pulling sensation in the gluteal region. The pain was so bad after the race that someone had to help him to the car. He has been walking with a limp. The pain is worse after sitting for about 10-15 minutes. He did get some relief the last time this happened after the Medrol Dosepak. He has stopped doing squats after his last visit because that seems to cause the pain to flare-up. No has not taken any OTC meds for the pain. He has not done PT or dry needling fort this episode.   02/13/2018: Compared to the last office visit, his previously described symptoms are improving. The pain isn't shooting down his leg anymore but he still has a lot of pain when sitting.  Current symptoms are moderate & are radiating to the L hamstring.  He has been taking Gabapentin for restless leg syndrome. He has Hydrocodone but he only takes it as needed. He has Cyclobenzaprine to take as needed but he isn't convinced that this is a muscular issue. He has started using Nitroglycerin patches again.  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   03/14/2018: Compared to the last office visit, his previously described symptoms show no change, some days pain is worse other days its not as intense.  Current symptoms are moderate-severe & are radiating to the L leg but not past the knee. He went for a jog and pain got so intense he needed assistance getting into his car.  He has tried [facility] and got minimal relief. He has been following [facility] Protocol with minimal relief. He did get some relief after his last steroid injection but the pain came back pretty quickly. He has been taking hydrocodone-APAP with some relief but he takes these intermittently. He is currently out of Hydrocodone-APAP. He has also been taking Flexeril prn.   ROS Reports night time disturbances. Denies fevers, chills, or night sweats. Denies unexplained weight loss. Denies personal history of cancer. Denies changes in bowel or bladder habits. Denies recent unreported falls. Denies new or worsening dyspnea or wheezing. Denies headaches or dizziness.  Reports numbness, tingling or weakness  In the extremities.  Denies dizziness or presyncopal episodes Denies lower extremity edema    HISTORY & PERTINENT PRIOR DATA:  Prior History reviewed and updated per electronic medical record.  Significant/pertinent history, findings, studies include:  reports that he has never smoked. He has never used smokeless tobacco. No results for input(s): HGBA1C, LABURIC, CREATINE in the last 8760 hours. No specialty comments available.  No problems updated.  OBJECTIVE:  VS:  HT:5\' 11"  (180.3 cm)   WT:167 lb 3.2 oz (75.8 kg)  BMI:23.33    BP:(Abnormal) 150/86  HR:(Abnormal) 59bpm  TEMP: ( )  RESP:98 %   PHYSICAL EXAM: Constitutional: WDWN, Non-toxic appearing. Psychiatric: Alert & appropriately interactive.  Not depressed or anxious appearing. Respiratory: No increased work of breathing.  Trachea Midline Eyes: Pupils are equal.  EOM  intact without nystagmus.  No scleral icterus  Vascular Exam: warm to touch no edema  lower extremity neuro exam: Unremarkable without focality although he does have slight straight leg raise on the left consistent with S1 radicular.  MSK Exam: He has weakness with hamstring testing worse at 30 degrees.  Focal TTP over the proximal hamstring but is difficult to elicit this exactly and is not reproducible.   ASSESSMENT & PLAN:   1. Left leg pain   2. Right hip pain   3. Ischial bursitis of left side     PLAN:>50% of this 25 minute visit spent in direct patient counseling and/or coordination of care.  Discussion was focused on education regarding the in discussing the pathoetiology and anticipated clinical course of the above condition.  Long discussion today regarding options.  We can consider a ultrasound-guided cortisone injection with needle fenestration versus PRP injection with needle fenestration into this area.  I would caution on the corticosteroid from the standpoint of increased risk of tendon rupture but this is less likely given the location.  I think he does have multifactorial issues including a S1 radiculitis however the more focally she I think is coming from the calcium deposits within the proximal tendon which is where he is sitting.  There does not appear to be any focal ischio bursa on exam or ultrasound bursa.  He will call to schedule for PRP.  Refill on his chronic pain medication was provided today.  He does realize that this is not going to be continuing from me but I am happy to do this for very short period of time.  Follow-up: No follow-ups on file.     Please see additional documentation for Objective, Assessment and Plan sections. Pertinent additional documentation may be included in corresponding procedure notes, imaging studies, problem based documentation and patient instructions. Please see these sections of the encounter for additional information regarding  this visit.  CMA/ATC served as Education administrator during this visit. History, Physical, and Plan performed by medical provider. Documentation and orders reviewed and attested to.      [name], [place] [facility] Physician

## 2018-03-22 ENCOUNTER — Ambulatory Visit: Payer: Medicare Other | Admitting: Sports Medicine

## 2018-03-24 ENCOUNTER — Encounter: Payer: Self-pay | Admitting: Sports Medicine

## 2018-03-27 ENCOUNTER — Encounter: Payer: Self-pay | Admitting: Sports Medicine

## 2018-03-27 ENCOUNTER — Ambulatory Visit: Payer: Self-pay

## 2018-03-27 ENCOUNTER — Ambulatory Visit: Payer: Medicare Other | Admitting: Sports Medicine

## 2018-03-27 VITALS — BP 124/82 | HR 69 | Ht 71.0 in | Wt 164.4 lb

## 2018-03-27 DIAGNOSIS — M79605 Pain in left leg: Secondary | ICD-10-CM

## 2018-03-27 DIAGNOSIS — M47816 Spondylosis without myelopathy or radiculopathy, lumbar region: Secondary | ICD-10-CM | POA: Diagnosis not present

## 2018-03-27 DIAGNOSIS — M7072 Other bursitis of hip, left hip: Secondary | ICD-10-CM

## 2018-03-27 DIAGNOSIS — M25552 Pain in left hip: Secondary | ICD-10-CM | POA: Diagnosis not present

## 2018-03-27 NOTE — Patient Instructions (Signed)

## 2018-03-27 NOTE — Progress Notes (Signed)
Jonathan Beasley. Jonathan Beasley, [place] at [facility] [phone]  Jonathan Beasley - 67 y.o. male MRN [id number]  Date of birth: 1951-10-25  Visit Date: 03/27/2018  PCP: Jonathan Deutscher, DO   Referred by: Jonathan Deutscher, DO  Scribe for today's visit: Jonathan Beasley, CMA     SUBJECTIVE:  Jonathan Beasley "[name]" is here for Follow-up (L leg pain, R hip pain)  09/29/17: Jonathan Beasley is an established patient presenting today in follow-up of LT leg and hip pain. He was last seen 08/29/2017 and was prescribed Medrol dosepak and Gabapentin 300 mg qhs. He also had u/s done 08/29/17. He was provided with link for [facility] exercises.  Pain flared up this past Tuesday after a race. He got stuck in the mud and when he slipped he felt a pulling sensation in the gluteal region. The pain was so bad after the race that someone had to help him to the car. He has been walking with a limp. The pain is worse after sitting for about 10-15 minutes. He did get some relief the last time this happened after the Medrol Dosepak. He has stopped doing squats after his last visit because that seems to cause the pain to flare-up. No has not taken any OTC meds for the pain. He has not done PT or dry needling fort this episode.   02/13/2018: Compared to the last office visit, his previously described symptoms are improving. The pain isn't shooting down his leg anymore but he still has a lot of pain when sitting.  Current symptoms are moderate & are radiating to the L hamstring.  He has been taking Gabapentin for restless leg syndrome. He has Hydrocodone but he only takes it as needed. He has Cyclobenzaprine to take as needed but he isn't convinced that this is a muscular issue. He has started using Nitroglycerin patches again.  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   03/14/2018: Compared to the last office visit, his previously described symptoms show no change, some days pain is worse other days its not as intense.  Current symptoms are moderate-severe & are radiating to the L leg but not past the knee. He went for a jog and pain got so intense he needed assistance getting into his car.  He has tried [employer] [facility] and got minimal relief. He has been following Nitro Protocol with minimal relief. He did get some relief after his last steroid injection but the pain came back pretty quickly. He has been taking hydrocodone-APAP with some relief but he takes these intermittently. He is currently out of Hydrocodone-APAP. He has also been taking Flexeril prn.   03/27/2018: Compared to the last office visit, his previously described symptoms are worsening, 10/10 pain level. He has run in 2 events since his last visit and this has caused pain to flare up.  Current symptoms are moderate-severe & are radiating to L leg and groin.  He has been taking Flexeril and hydrocodone-APAP. He has also been following Nitro Protocol but he hasn't been wearing them daily.   ROS Reports night time disturbances. Denies fevers, chills, or night sweats. Denies unexplained weight loss. Denies personal history of cancer. Denies changes in bowel or bladder habits. Denies recent unreported falls. Denies new or worsening dyspnea or wheezing. Denies headaches  or dizziness.  Reports numbness, tingling or weakness  In the extremities.  Denies dizziness or presyncopal episodes Denies lower extremity edema    HISTORY & PERTINENT PRIOR DATA:  Prior History reviewed and updated per electronic medical record.  Significant/pertinent history, findings, studies include:  reports that he has never smoked. He has never used smokeless tobacco. No results for input(s): HGBA1C, LABURIC, CREATINE in the last 8760 hours. No specialty comments  available. No problems updated.  OBJECTIVE:  VS:  HT:5\' 11"  (180.3 cm)   WT:164 lb 6.4 oz (74.6 kg)  BMI:22.94    BP:124/82  HR:69bpm  TEMP: ( )  RESP:96 %   PHYSICAL EXAM: Constitutional: WDWN, Non-toxic appearing. Psychiatric: Alert & appropriately interactive.  Not depressed or anxious appearing. Respiratory: No increased work of breathing.  Trachea Midline Eyes: Pupils are equal.  EOM intact without nystagmus.  No scleral icterus  Vascular Exam: warm to touch no edema  lower extremity neuro exam: unremarkable  MSK Exam: Persistent TTP over the left ischial tuberosity.  Hamstring is slightly tender with resistance at 30 degrees of knee flexion.  No palpable defect.   ASSESSMENT & PLAN:   1. Left leg pain   2. Ischial bursitis of left side   3. Lumbar spondylosis   4. Pain in joint of left hip     PLAN: Injection and needle fenestration to the proximal hamstring/ischial tuberosity/ischial bursa performed today.  He overall responded well to this.  We will have him begin working on using acetic acid iontophoresis with physical therapy for the calcific tendinitis aspect of this and follow-up with him in 2 weeks to ensure clinical improvement and discuss advancing his activities at that time.  Follow-up: Return in about 2 weeks (around 04/10/2018).     Please see additional documentation for Objective, Assessment and Plan sections. Pertinent additional documentation may be included in corresponding procedure notes, imaging studies, problem based documentation and patient instructions. Please see these sections of the encounter for additional information regarding this visit.  CMA/ATC served as Education administrator during this visit. History, Physical, and Plan performed by medical provider. Documentation and orders reviewed and attested to.      [name], [place] [facility] Physician

## 2018-04-01 MED ORDER — ACETIC ACID 5 % SOLN: 1.0000 "application " | Freq: Every day | 5 refills | Status: DC | PRN

## 2018-04-01 NOTE — Procedures (Signed)
PROCEDURE NOTE:  Ultrasound Guided: Injection: Left hamstring origin/ischial bursa Images were obtained and interpreted by myself, Teresa Coombs, DO  Images have been saved and stored to PACS system. Images obtained on: GE S7 Ultrasound machine    ULTRASOUND FINDINGS:  Small amount of initial bursitis compared to last time.  He does have calcific changes within the proximal hamstring consistent with calcific tendinitis changes.  DESCRIPTION OF PROCEDURE:  The patient's clinical condition is marked by substantial pain and/or significant functional disability. Other conservative therapy has not provided relief, is contraindicated, or not appropriate. There is a reasonable likelihood that injection will significantly improve the patient's pain and/or functional impairment.   After discussing the risks, benefits and expected outcomes of the injection and all questions were reviewed and answered, the patient wished to undergo the above named procedure.  Verbal consent was obtained.  The ultrasound was used to identify the target structure and adjacent neurovascular structures. The skin was then prepped in sterile fashion and the target structure was injected under direct visualization using sterile technique as below:  PREP: Alcohol and Ethel Chloride APPROACH: direct, single injection, 21g 2 in. needle fenestration performed INJECTATE: 2 cc 0.5% Marcaine and 2 cc 40mg /mL DepoMedrol ASPIRATE: None DRESSING: Band-Aid  Post procedural instructions including recommending icing and warning signs for infection were reviewed.    This procedure was well tolerated and there were no complications.   IMPRESSION: Succesful Ultrasound Guided: Injection

## 2018-04-11 ENCOUNTER — Encounter: Payer: Self-pay | Admitting: Sports Medicine

## 2018-04-11 ENCOUNTER — Ambulatory Visit: Payer: Self-pay

## 2018-04-11 ENCOUNTER — Ambulatory Visit: Payer: Medicare Other | Admitting: Sports Medicine

## 2018-04-11 VITALS — BP 130/82 | HR 76 | Ht 71.0 in | Wt 166.0 lb

## 2018-04-11 DIAGNOSIS — M47816 Spondylosis without myelopathy or radiculopathy, lumbar region: Secondary | ICD-10-CM

## 2018-04-11 DIAGNOSIS — M79605 Pain in left leg: Secondary | ICD-10-CM | POA: Diagnosis not present

## 2018-04-11 DIAGNOSIS — M1990 Unspecified osteoarthritis, unspecified site: Secondary | ICD-10-CM

## 2018-04-11 DIAGNOSIS — M7072 Other bursitis of hip, left hip: Secondary | ICD-10-CM | POA: Diagnosis not present

## 2018-04-11 MED ORDER — HYDROCODONE-ACETAMINOPHEN 7.5-325 MG PO TABS: 1.0000 | ORAL_TABLET | Freq: Four times a day (QID) | ORAL | 0 refills | Status: DC | PRN

## 2018-04-11 NOTE — Progress Notes (Signed)
[name]. Madailein Londo, [place] at [facility] [phone]  Severus Brodzinski Nitschke - 67 y.o. male MRN [id number]  Date of birth: 1951-08-23  Visit Date: 04/11/2018  PCP: Briscoe Deutscher, DO   Referred by: Briscoe Deutscher, DO  Scribe for today's visit: Josepha Pigg, CMA     SUBJECTIVE:  [name] is here for Follow-up (L leg pain)  09/29/17: Mr. Empson is an established patient presenting today in follow-up of LT leg and hip pain. He was last seen 08/29/2017 and was prescribed Medrol dosepak and Gabapentin 300 mg qhs. He also had u/s done 08/29/17. He was provided with link for [facility] exercises.  Pain flared up this past Tuesday after a race. He got stuck in the mud and when he slipped he felt a pulling sensation in the gluteal region. The pain was so bad after the race that someone had to help him to the car. He has been walking with a limp. The pain is worse after sitting for about 10-15 minutes. He did get some relief the last time this happened after the Medrol Dosepak. He has stopped doing squats after his last visit because that seems to cause the pain to flare-up. No has not taken any OTC meds for the pain. He has not done PT or dry needling fort this episode.   02/13/2018: Compared to the last office visit, his previously described symptoms are improving. The pain isn't shooting down his leg anymore but he still has a lot of pain when sitting.  Current symptoms are moderate & are radiating to the L hamstring.  He has been taking Gabapentin for restless leg syndrome. He has Hydrocodone but he only takes it as needed. He has Cyclobenzaprine to take as needed but he isn't convinced that this is a muscular issue. He has started using Nitroglycerin patches again.  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   03/14/2018: Compared to the last office visit, his previously described symptoms show no change, some days pain is worse other days its not as intense.  Current symptoms are moderate-severe & are radiating to the L leg but not past the knee. He went for a jog and pain got so intense he needed assistance getting into his car.  He has tried [employer] [facility] and got minimal relief. He has been following Nitro Protocol with minimal relief. He did get some relief after his last steroid injection but the pain came back pretty quickly. He has been taking hydrocodone-APAP with some relief but he takes these intermittently. He is currently out of Hydrocodone-APAP. He has also been taking Flexeril prn.   03/27/2018: Compared to the last office visit, his previously described symptoms are worsening, 10/10 pain level. He has run in 2 events since his last visit and this has caused pain to flare up.  Current symptoms are moderate-severe & are radiating to L leg and groin.  He has been taking Flexeril and hydrocodone-APAP. He has also been following Nitro Protocol but he hasn't been wearing them daily.   04/11/2018: Compared to the last office visit, his previously described symptoms are worsening. He was holding a squat a few days ago and thinks this triggered the pain to flare up. The pain in his hamstring has improved. He has noticed increased pain  in the glute near the site of the injection. He c/o increased groin pain.  Current symptoms are moderate & are radiating to the L leg and groin.  He has been taking Hydrocodone-APAP and Flexeril with some relief. He has been following Nitro Protocol daily. He took a week off from running after receiving injection 03/27/2018. He has been wearing Body Helix compression for the groin with minimal relief. He finds that the Velcro pops off too easily. He has not been able to pick up Acetic Acid 5 % from his pharmacy.   ROS Reports night time  disturbances. Denies fevers, chills, or night sweats. Denies unexplained weight loss. Denies personal history of cancer. Denies changes in bowel or bladder habits. Denies recent unreported falls. Denies new or worsening dyspnea or wheezing. Denies headaches or dizziness.  Reports numbness, tingling or weakness  In the extremities.  Denies dizziness or presyncopal episodes Denies lower extremity edema    HISTORY & PERTINENT PRIOR DATA:  Prior History reviewed and updated per electronic medical record.  Significant/pertinent history, findings, studies include:  reports that he has never smoked. He has never used smokeless tobacco. No results for input(s): HGBA1C, LABURIC, CREATINE in the last 8760 hours. No specialty comments available. No problems updated.  OBJECTIVE:  VS:  HT:5\' 11"  (180.3 cm)   WT:166 lb (75.3 kg)  BMI:23.16    BP:130/82  HR:76bpm  TEMP: ( )  RESP:96 %   PHYSICAL EXAM: Constitutional: WDWN, Non-toxic appearing. Psychiatric: Alert & appropriately interactive.  Not depressed or anxious appearing. Respiratory: No increased work of breathing.  Trachea Midline Eyes: Pupils are equal.  EOM intact without nystagmus.  No scleral icterus  Vascular Exam: warm to touch no edema  lower extremity neuro exam: Unremarkable  normal strength  normal sensation  normal reflexes   MSK Exam: Left hamstring with mild TTP over the origin of the medial hamstrings.  Small amount of pain with sciatic notch palpation.  He has good internal and external rotation bilateral hips.  Negative Stinchfield test.   ASSESSMENT & PLAN:   1. Ischial bursitis of left side   2. Left leg pain   3. Lumbar spondylosis   4. Arthritis     PLAN: Overall is 2 weeks out from injection from persistent pain recommend avoidance of possible exacerbating activities.  We did discuss using acetic acid for iontophoresis with again.  We will plan on follow-up in 3 weeks for clinical  reevaluation.  Refill of pain medication to be used for upcoming trip to town but he does this will not be continued prescribed on the regular basis but he is using it slightly more often given travel schedule.  Follow-up: Return in about 3 weeks (around 05/02/2018).      Please see additional documentation for Objective, Assessment and Plan sections. Pertinent additional documentation may be included in corresponding procedure notes, imaging studies, problem based documentation and patient instructions. Please see these sections of the encounter for additional information regarding this visit.  CMA/ATC served as Education administrator during this visit. History, Physical, and Plan performed by medical provider. Documentation and orders reviewed and attested to.      [name], [place] [facility] Physician

## 2018-04-11 NOTE — Procedures (Signed)
LIMITED MSK ULTRASOUND OF Left hamstring Images were obtained and interpreted by myself, Teresa Coombs, DO  Images have been saved and stored to PACS system. Images obtained on: GE S7 Ultrasound machine  FINDINGS:   Medial hamstring musculature hypoechoic change at the origin.  There is calcification and spurring of the hamstring origin.  Mild increased neovascularity.  IMPRESSION:  1. Healing calcific tendinitis of the left proximal hamstring with resolution of overlying ischial bursa.

## 2018-04-17 DIAGNOSIS — M25552 Pain in left hip: Secondary | ICD-10-CM | POA: Diagnosis not present

## 2018-04-17 DIAGNOSIS — M47896 Other spondylosis, lumbar region: Secondary | ICD-10-CM | POA: Diagnosis not present

## 2018-04-18 ENCOUNTER — Encounter: Payer: Self-pay | Admitting: Sports Medicine

## 2018-04-19 ENCOUNTER — Encounter: Payer: Self-pay | Admitting: Sports Medicine

## 2018-04-19 DIAGNOSIS — M25552 Pain in left hip: Secondary | ICD-10-CM | POA: Diagnosis not present

## 2018-04-19 DIAGNOSIS — M47896 Other spondylosis, lumbar region: Secondary | ICD-10-CM | POA: Diagnosis not present

## 2018-05-01 ENCOUNTER — Encounter: Payer: Self-pay | Admitting: Sports Medicine

## 2018-05-02 ENCOUNTER — Encounter: Payer: Self-pay | Admitting: Sports Medicine

## 2018-05-11 ENCOUNTER — Ambulatory Visit: Payer: Self-pay

## 2018-05-11 ENCOUNTER — Ambulatory Visit: Payer: Medicare Other | Admitting: Sports Medicine

## 2018-05-11 ENCOUNTER — Encounter: Payer: Self-pay | Admitting: Sports Medicine

## 2018-05-11 VITALS — BP 130/82 | HR 78 | Ht 71.0 in | Wt 162.4 lb

## 2018-05-11 DIAGNOSIS — M9906 Segmental and somatic dysfunction of lower extremity: Secondary | ICD-10-CM

## 2018-05-11 DIAGNOSIS — M9902 Segmental and somatic dysfunction of thoracic region: Secondary | ICD-10-CM

## 2018-05-11 DIAGNOSIS — M9904 Segmental and somatic dysfunction of sacral region: Secondary | ICD-10-CM

## 2018-05-11 DIAGNOSIS — M9903 Segmental and somatic dysfunction of lumbar region: Secondary | ICD-10-CM | POA: Diagnosis not present

## 2018-05-11 DIAGNOSIS — M1990 Unspecified osteoarthritis, unspecified site: Secondary | ICD-10-CM | POA: Diagnosis not present

## 2018-05-11 DIAGNOSIS — M7072 Other bursitis of hip, left hip: Secondary | ICD-10-CM | POA: Diagnosis not present

## 2018-05-11 DIAGNOSIS — M9905 Segmental and somatic dysfunction of pelvic region: Secondary | ICD-10-CM

## 2018-05-11 MED ORDER — GABAPENTIN 300 MG PO CAPS: 300.0000 mg | ORAL_CAPSULE | Freq: Every day | ORAL | 2 refills | Status: DC

## 2018-05-11 MED ORDER — CYCLOBENZAPRINE HCL 10 MG PO TABS: 10.0000 mg | ORAL_TABLET | Freq: Three times a day (TID) | ORAL | 3 refills | Status: DC | PRN

## 2018-05-11 MED ORDER — HYDROCODONE-ACETAMINOPHEN 7.5-325 MG PO TABS: 1.0000 | ORAL_TABLET | Freq: Four times a day (QID) | ORAL | 0 refills | Status: DC | PRN

## 2018-05-11 NOTE — Progress Notes (Signed)
Jonathan Beasley. Jonathan Beasley, [place] at [facility] [phone]  Karry Causer [place] - 67 y.o. male MRN [id number]  Date of birth: 02-26-1951  Visit Date: 05/11/2018  PCP: Briscoe Deutscher, DO   Referred by: Briscoe Deutscher, DO  Scribe(s) for today's visit: Wendy Poet, LAT, ATC  SUBJECTIVE:  Jonathan Coup "Jonathan Beasley" is here for Follow-up (L hip and glute pain) .    09/29/17: Jonathan Beasley is an established patient presenting today in follow-up of LT leg and hip pain. He was last seen 08/29/2017 and was prescribed Medrol dosepak and Gabapentin 300 mg qhs. He also had u/s done 08/29/17. He was provided with link for [facility] exercises.  Pain flared up this past Tuesday after a race. He got stuck in the mud and when he slipped he felt a pulling sensation in the gluteal region. The pain was so bad after the race that someone had to help him to the car. He has been walking with a limp. The pain is worse after sitting for about 10-15 minutes. He did get some relief the last time this happened after the Medrol Dosepak. He has stopped doing squats after his last visit because that seems to cause the pain to flare-up. No has not taken any OTC meds for the pain. He has not done PT or dry needling fort this episode.   02/13/2018: Compared to the last office visit, his previously described symptoms are improving. The pain isn't shooting down his leg anymore but he still has a lot of pain when sitting.  Current symptoms are moderate & are radiating to the L hamstring.  He has been taking Gabapentin for restless leg syndrome. He has Hydrocodone but he only takes it as needed. He has Cyclobenzaprine to take as needed but he isn't convinced that this is a muscular issue. He has started using Nitroglycerin patches again.  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   03/14/2018: Compared to the last office visit, his previously described symptoms show no change, some days pain is worse other days its not as intense.  Current symptoms are moderate-severe & are radiating to the L leg but not past the knee. He went for a jog and pain got so intense he needed assistance getting into his car.  He has tried [employer] [facility] and got minimal relief. He has been following Nitro Protocol with minimal relief. He did get some relief after his last steroid injection but the pain came back pretty quickly. He has been taking hydrocodone-APAP with some relief but he takes these intermittently. He is currently out of Hydrocodone-APAP. He has also been taking Flexeril prn.   03/27/2018: Compared to the last office visit, his previously described symptoms are worsening, 10/10 pain level. He has run in 2 events since his last visit and this has caused pain to flare up.  Current symptoms are moderate-severe & are radiating to L leg and groin.  He has been taking Flexeril and hydrocodone-APAP. He has also been following Nitro Protocol but he hasn't been wearing them daily.   04/11/2018: Compared to the last office visit, his previously described symptoms are worsening. He was holding a squat a few days ago and thinks this triggered the pain to flare up. The pain in his hamstring has improved. He has  noticed increased pain in the glute near the site of the injection. He c/o increased groin pain.  Current symptoms are moderate & are radiating to the L leg and groin.  He has been taking Hydrocodone-APAP and Flexeril with some relief. He has been following Nitro Protocol daily. He took a week off from running after receiving injection 03/27/2018. He has been wearing Body Helix compression for the groin with minimal relief. He finds that the Velcro pops off too easily. He has not been able to pick up Acetic Acid 5 % from his pharmacy.    05/11/2018: Compared to the last office visit on 04/11/18, his previously described L hip and glute symptoms show no change.  Went to [employer] in [place] and had a problem w/ the prolonged sitting in the plane and in the car.  He leaves in 2.5 weeks for another running event/competition. Current symptoms are slightly severe & are radiating to L h/s He has been using hydrocodone, Flexeril.  He is out of Flexeril.  He uses a Body Helix thigh sleeve.  He had a L h/s injection on 03/27/18.   REVIEW OF SYSTEMS: Denies night time disturbances. Denies fevers, chills, or night sweats. Denies unexplained weight loss. Denies personal history of cancer. Denies changes in bowel or bladder habits. Denies recent unreported falls. Denies new or worsening dyspnea or wheezing. Denies headaches or dizziness.  Reports numbness, tingling or weakness  In the extremities - in L LE Denies dizziness or presyncopal episodes Denies lower extremity edema    HISTORY & PERTINENT PRIOR DATA:  Significant/pertinent history, findings, studies include:  reports that he has never smoked. He has never used smokeless tobacco. No results for input(s): HGBA1C, LABURIC, CREATINE in the last 8760 hours. No specialty comments available. No problems updated.  Otherwise prior history reviewed and updated per electronic medical record.    OBJECTIVE:  VS:  HT:5\' 11"  (180.3 cm)   WT:162 lb 6.4 oz (73.7 kg)  BMI:22.66    BP:130/82  HR:78bpm  TEMP: ( )  RESP:96 %   PHYSICAL EXAM: CONSTITUTIONAL: Well-developed, Well-nourished and In no acute distress Alert & appropriately interactive. and Not depressed or anxious appearing. RESPIRATORY: No increased work of breathing and Trachea Midline EYES: Pupils are equal., EOM intact without nystagmus. and No scleral icterus.  Lower extremities: Warm and well perfused NEURO: unremarkable Normal associated myotomal distribution strength to manual muscle testing Normal  sensation to light touch Normal and symmetric associated DTRs  MSK Exam: Left HIP Exam: Well aligned, no significant deformity. No overlying skin changes. TTP over Minimally over the issue tuberosity.  Pain with palpation of the popliteal space. Non tender over Greater trochanter, iliac crest.   Log Roll: normal, no pain FADIR: normal, no pain FABER: normal, no pain Stinchfield testing: normal, no pain. strength Normal  PROCEDURES & DATA REVIEWED:  . Osteopathic manipulation was performed today based on physical exam findings.  Please see procedure note for further information including Osteopathic Exam findings . Discussed the foundation of treatment for this condition is physical therapy and/or daily (5-6 days/week) therapeutic exercises, focusing on core strengthening, coordination, neuromuscular control/reeducation.  Therapeutic exercises prescribed per procedure note.  ASSESSMENT   1. Ischial bursitis of left side   2. Arthritis   3. Somatic dysfunction of sacral region   4. Somatic dysfunction of lower extremity   5. Somatic dysfunction of thoracic region   6. Somatic dysfunction of lumbar region   7. Somatic dysfunction of pelvis region  PLAN:   Rest the injured area as much as practical Apply ice packs Continue your home exercise program    . Meds refilled including short course NORCO, Flexeril and Gabapentin . [place] is reassuring that no evidence of tear  . Please see procedure section and notes. No problem-specific Assessment & Plan notes found for this encounter.  Follow-up: Return in about 1 month (around 06/08/2018).      Please see additional documentation for Objective, Assessment and Plan sections. Pertinent additional documentation may be included in corresponding procedure notes, imaging studies, problem based documentation and patient instructions. Please see these sections of the encounter for additional information regarding this visit.  CMA/ATC served  as Education administrator during this visit. History, Physical, and Plan performed by medical provider. Documentation and orders reviewed and attested to.      [name], [place] [facility] Physician

## 2018-05-11 NOTE — Progress Notes (Signed)
PROCEDURE NOTE : OSTEOPATHIC MANIPULATION The decision today to treat with Osteopathic Manipulative Therapy (OMT) was based on physical exam findings. Verbal consent was obtained following a discussion with the patient regarding the of risks, benefits and potential side effects, including an acute pain flare,post manipulation soreness and need for repeat treatments.     Contraindications to OMT: NONE  Manipulation was performed as below: Regions treated: Thoracic spine, Lumbar spine, Pelvis, Sacrum and Lower extremities OMT Techniques Used: HVLA, muscle energy, myofascial release, articulatory, soft tissue and Long lever  The patient tolerated the treatment well and reported Improved symptoms following treatment today. Patient was given medications, exercises, stretches and lifestyle modifications per AVS and verbally.   OSTEOPATHIC/STRUCTURAL EXAM:   T4 -8 Neutral, Rotated LEFT, Sidebent RIGHT T10 FRS right (Flexed, Rotated & Sidebent) L3 FRS left (Flexed, Rotated & Sidebent) Left psoas spasm Left anterior innonimate R on R sacral torsion Left anterior fibular head

## 2018-05-16 ENCOUNTER — Encounter: Payer: Self-pay | Admitting: Sports Medicine

## 2018-06-13 ENCOUNTER — Ambulatory Visit: Payer: Medicare Other | Admitting: Sports Medicine

## 2018-06-13 ENCOUNTER — Encounter: Payer: Self-pay | Admitting: Sports Medicine

## 2018-06-13 VITALS — BP 130/72 | HR 72 | Ht 71.0 in | Wt 164.2 lb

## 2018-06-13 DIAGNOSIS — M9907 Segmental and somatic dysfunction of upper extremity: Secondary | ICD-10-CM | POA: Diagnosis not present

## 2018-06-13 DIAGNOSIS — M1990 Unspecified osteoarthritis, unspecified site: Secondary | ICD-10-CM

## 2018-06-13 DIAGNOSIS — M7072 Other bursitis of hip, left hip: Secondary | ICD-10-CM

## 2018-06-13 DIAGNOSIS — M479 Spondylosis, unspecified: Secondary | ICD-10-CM | POA: Diagnosis not present

## 2018-06-13 DIAGNOSIS — M9902 Segmental and somatic dysfunction of thoracic region: Secondary | ICD-10-CM

## 2018-06-13 DIAGNOSIS — M9903 Segmental and somatic dysfunction of lumbar region: Secondary | ICD-10-CM

## 2018-06-13 MED ORDER — MELOXICAM 15 MG PO TABS: ORAL_TABLET | ORAL | 3 refills | Status: DC

## 2018-06-13 MED ORDER — HYDROCODONE-ACETAMINOPHEN 7.5-325 MG PO TABS: 1.0000 | ORAL_TABLET | Freq: Four times a day (QID) | ORAL | 0 refills | Status: DC | PRN

## 2018-06-13 MED ORDER — GABAPENTIN 100 MG PO CAPS: ORAL_CAPSULE | ORAL | 2 refills | Status: DC

## 2018-06-13 MED ORDER — CYCLOBENZAPRINE HCL 10 MG PO TABS: 10.0000 mg | ORAL_TABLET | Freq: Three times a day (TID) | ORAL | 3 refills | Status: DC | PRN

## 2018-06-13 NOTE — Progress Notes (Signed)
Jonathan Beasley. Jonathan Beasley, [place] at [facility] [phone]  Jonathan Beasley - 67 y.o. male MRN [id number]  Date of birth: 24-Apr-1951  Visit Date: 06/13/2018  PCP: Briscoe Deutscher, DO   Referred by: Briscoe Deutscher, DO  Scribe(s) for today's visit: Josepha Pigg, CMA  SUBJECTIVE:  Jonathan Beasley "[name]" is here for Follow-up (ischial bursitis of L side)   09/29/17: Jonathan Beasley is an established patient presenting today in follow-up of LT leg and hip pain. He was last seen 08/29/2017 and was prescribed Medrol dosepak and Gabapentin 300 mg qhs. He also had u/s done 08/29/17. He was provided with link for [facility] exercises.  Pain flared up this past Tuesday after a race. He got stuck in the mud and when he slipped he felt a pulling sensation in the gluteal region. The pain was so bad after the race that someone had to help him to the car. He has been walking with a limp. The pain is worse after sitting for about 10-15 minutes. He did get some relief the last time this happened after the Medrol Dosepak. He has stopped doing squats after his last visit because that seems to cause the pain to flare-up. No has not taken any OTC meds for the pain. He has not done PT or dry needling fort this episode.   02/13/2018: Compared to the last office visit, his previously described symptoms are improving. The pain isn't shooting down his leg anymore but he still has a lot of pain when sitting.  Current symptoms are moderate & are radiating to the L hamstring.  He has been taking Gabapentin for restless leg syndrome. He has Hydrocodone but he only takes it as needed. He has Cyclobenzaprine to take as needed but he isn't convinced that this is a muscular issue. He has started using Nitroglycerin patches again.  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   03/14/2018: Compared to the last office visit, his previously described symptoms show no change, some days pain is worse other days its not as intense.  Current symptoms are moderate-severe & are radiating to the L leg but not past the knee. He went for a jog and pain got so intense he needed assistance getting into his car.  He has tried [employer] [facility] and got minimal relief. He has been following Nitro Protocol with minimal relief. He did get some relief after his last steroid injection but the pain came back pretty quickly. He has been taking hydrocodone-APAP with some relief but he takes these intermittently. He is currently out of Hydrocodone-APAP. He has also been taking Flexeril prn.   03/27/2018: Compared to the last office visit, his previously described symptoms are worsening, 10/10 pain level. He has run in 2 events since his last visit and this has caused pain to flare up.  Current symptoms are moderate-severe & are radiating to L leg and groin.  He has been taking Flexeril and hydrocodone-APAP. He has also been following Nitro Protocol but he hasn't been wearing them daily.   04/11/2018: Compared to the last office visit, his previously described symptoms are worsening. He was holding a squat a few days ago and thinks this triggered the pain to flare up. The pain in his hamstring has improved. He has noticed increased pain  in the glute near the site of the injection. He c/o increased groin pain.  Current symptoms are moderate & are radiating to the L leg and groin.  He has been taking Hydrocodone-APAP and Flexeril with some relief. He has been following Nitro Protocol daily. He took a week off from running after receiving injection 03/27/2018. He has been wearing Body Helix compression for the groin with minimal relief. He finds that the Velcro pops off too easily. He has not been able to pick up Acetic Acid 5 % from his pharmacy.    05/11/2018: Compared to the last office visit on 04/11/18, his previously described L hip and glute symptoms show no change.  Went to [employer] in [place] and had a problem w/ the prolonged sitting in the plane and in the car.  He leaves in 2.5 weeks for another running event/competition. Current symptoms are slightly severe & are radiating to L h/s He has been using hydrocodone, Flexeril.  He is out of Flexeril.  He uses a Body Helix thigh sleeve.  He had a L h/s injection on 03/27/18.  06/13/2018 Compared to the last office visit, his previously described symptoms are improving. Any time he has to fly or drive it feels like someone is stabbing a knife in the back of his leg.  Current symptoms are moderate & are radiating to L h/s He has been taking hydrocodone for severe pain. He has tried rolling a tennis ball under his leg and he gets no relief. He feels like sx are nerve related but he is not interested in surgery.   He c/o L shoulder pain which started to flare up when he began lifting again.  He had MR in 2008, ruptured C5-C7.      REVIEW OF SYSTEMS: Denies night time disturbances. Denies fevers, chills, or night sweats. Denies unexplained weight loss. Denies personal history of cancer. Denies changes in bowel or bladder habits. Denies recent unreported falls. Denies new or worsening dyspnea or wheezing. Denies headaches or dizziness.  Reports numbness, tingling or weakness  In the extremities.  Denies dizziness or presyncopal episodes Denies lower extremity edema    HISTORY & PERTINENT PRIOR DATA:  Prior History reviewed and updated per electronic medical record.  Significant/pertinent history, findings, studies include:  reports that he has never smoked. He has never used smokeless tobacco. No results for input(s): HGBA1C, LABURIC, CREATINE in the last 8760 hours. No specialty comments available. No problems updated.  OBJECTIVE:  VS:  HT:5\' 11"  (180.3 cm)    WT:164 lb 3.2 oz (74.5 kg)  BMI:22.91    BP:130/72  HR:72bpm  TEMP: ( )  RESP:96 %   PHYSICAL EXAM: Constitutional: WDWN, Non-toxic appearing. Psychiatric: Alert & appropriately interactive.  Not depressed or anxious appearing. Respiratory: No increased work of breathing.  Trachea Midline Eyes: Pupils are equal.  EOM intact without nystagmus.  No scleral icterus  Vascular Exam: warm to touch no edema  upper and lower extremity neuro exam: normal strength Slight S1 distribution radiculitis on the left greater than right.  He has pain with straight leg raise and pain within the popliteal space.  Greater sciatic notch is tender.  Poor thoracic mobility.  MSK Exam: He is able to heel toe walk without difficulty.  Neural tension signs are present but his most focal pain is over the posterior hamstring at the upper portion with no palpable defect.   PROCEDURES & DATA REVIEWED:  PROCEDURE NOTE : OSTEOPATHIC MANIPULATION The decision today  to treat with Osteopathic Manipulative Therapy (OMT) was based on physical exam findings. Verbal consent was obtained following a discussion with the patient regarding the of risks, benefits and potential side effects, including an acute pain flare,post manipulation soreness and need for repeat treatments.     Contraindications to OMT: NONE  Manipulation was performed as below: Regions treated: Thoracic spine, Lumbar spine and Upper extremities OMT Techniques Used: HVLA, muscle energy and myofascial release  The patient tolerated the treatment well and reported Improved symptoms following treatment today. Patient was given medications, exercises, stretches and lifestyle modifications per AVS and verbally.   OSTEOPATHIC/STRUCTURAL EXAM:   T6-T10, neutral rotated left, side bent right L5 FRS left Left shoulder, with glenohumeral internal rotation and depressed scapula.  ASSESSMENT & PLAN:   1. Arthritis   2. Spondylosis   3. Ischial bursitis of  left side   4. Somatic dysfunction of upper extremity   5. Somatic dysfunction of thoracic region   6. Somatic dysfunction of lumbar region     PLAN: Osteopathic manipulation was performed today based on physical exam findings.  Please see procedure note for further information including Osteopathic Exam findings  The majority of his symptoms do seem to be coming from a combination of both prior calcific tendinitis of the hamstring as well as true radicular symptoms.  He is not interested in further therapeutic intervention is wanting to try to avoid surgery at all costs.  Given the overall good strength I think this is appropriate.  We did titrate medications today and he is slowly starting to come back on the hydrocodone and discussed that if he is not able to further continue to cut back for further evaluation and a chronic pain clinic will be indicated but at this time he does not have any significant trips coming up and will be able to wean back he believes.  Case was discussed with Dr. Juleen China and [facility] database has been checked previously.  Appropriate fill patterns are consistent.   Follow-up: Return in about 1 month (around 07/11/2018).      Please see additional documentation for Objective, Assessment and Plan sections. Pertinent additional documentation may be included in corresponding procedure notes, imaging studies, problem based documentation and patient instructions. Please see these sections of the encounter for additional information regarding this visit.  CMA/ATC served as Education administrator during this visit. History, Physical, and Plan performed by medical provider. Documentation and orders reviewed and attested to.      [name], [place] [facility] Physician

## 2018-06-19 ENCOUNTER — Encounter: Payer: Self-pay | Admitting: Sports Medicine

## 2018-07-03 ENCOUNTER — Other Ambulatory Visit: Payer: Self-pay | Admitting: Family Medicine

## 2018-07-03 DIAGNOSIS — G2581 Restless legs syndrome: Secondary | ICD-10-CM

## 2018-07-09 ENCOUNTER — Encounter: Payer: Self-pay | Admitting: Sports Medicine

## 2018-07-09 NOTE — Procedures (Signed)
LIMITED MSK ULTRASOUND OF Left Hamstring Images were obtained and interpreted by myself, Teresa Coombs, DO  Images have been saved and stored to PACS system. Images obtained on: GE S7 Ultrasound machine  FINDINGS:   Slight fragmentation for of left ischial tuberosity  Normal appearance of hamstring musculature   IMPRESSION:  1. Hamstring origin tendinopathy, calcific changes improving

## 2018-07-11 ENCOUNTER — Ambulatory Visit: Payer: Medicare Other | Admitting: Sports Medicine

## 2018-08-15 ENCOUNTER — Encounter: Payer: Self-pay | Admitting: Family Medicine

## 2018-08-15 ENCOUNTER — Ambulatory Visit (INDEPENDENT_AMBULATORY_CARE_PROVIDER_SITE_OTHER): Payer: Medicare Other | Admitting: Family Medicine

## 2018-08-15 VITALS — BP 138/84 | HR 59 | Temp 98.1°F | Ht 71.0 in | Wt 165.2 lb

## 2018-08-15 DIAGNOSIS — Z0000 Encounter for general adult medical examination without abnormal findings: Secondary | ICD-10-CM

## 2018-08-15 DIAGNOSIS — T466X5A Adverse effect of antihyperlipidemic and antiarteriosclerotic drugs, initial encounter: Secondary | ICD-10-CM | POA: Insufficient documentation

## 2018-08-15 DIAGNOSIS — M791 Myalgia, unspecified site: Secondary | ICD-10-CM | POA: Diagnosis not present

## 2018-08-15 DIAGNOSIS — Z23 Encounter for immunization: Secondary | ICD-10-CM | POA: Diagnosis not present

## 2018-08-15 DIAGNOSIS — M25551 Pain in right hip: Secondary | ICD-10-CM

## 2018-08-15 DIAGNOSIS — E782 Mixed hyperlipidemia: Secondary | ICD-10-CM

## 2018-08-15 DIAGNOSIS — Z8042 Family history of malignant neoplasm of prostate: Secondary | ICD-10-CM | POA: Diagnosis not present

## 2018-08-15 DIAGNOSIS — R0600 Dyspnea, unspecified: Secondary | ICD-10-CM

## 2018-08-15 DIAGNOSIS — M5416 Radiculopathy, lumbar region: Secondary | ICD-10-CM

## 2018-08-15 DIAGNOSIS — M7072 Other bursitis of hip, left hip: Secondary | ICD-10-CM

## 2018-08-15 DIAGNOSIS — J683 Other acute and subacute respiratory conditions due to chemicals, gases, fumes and vapors: Secondary | ICD-10-CM

## 2018-08-15 DIAGNOSIS — M1990 Unspecified osteoarthritis, unspecified site: Secondary | ICD-10-CM

## 2018-08-15 LAB — PSA: PSA: 0.7 ng/mL (ref 0.10–4.00)

## 2018-08-15 LAB — COMPREHENSIVE METABOLIC PANEL
ALT: 22 U/L (ref 0–53)
AST: 30 U/L (ref 0–37)
Albumin: 4.3 g/dL (ref 3.5–5.2)
Alkaline Phosphatase: 52 U/L (ref 39–117)
BUN: 23 mg/dL (ref 6–23)
CO2: 31 mEq/L (ref 19–32)
Calcium: 9.4 mg/dL (ref 8.4–10.5)
Chloride: 101 mEq/L (ref 96–112)
Creatinine, Ser: 0.84 mg/dL (ref 0.40–1.50)
GFR: 96.98 mL/min (ref >60.00)
Glucose, Bld: 86 mg/dL (ref 70–99)
Potassium: 4.5 mEq/L (ref 3.5–5.1)
Sodium: 137 mEq/L (ref 135–145)
Total Bilirubin: 0.4 mg/dL (ref 0.2–1.2)
Total Protein: 7.2 g/dL (ref 6.0–8.3)

## 2018-08-15 LAB — CBC WITH DIFFERENTIAL/PLATELET
Basophils Absolute: 0 10*3/uL (ref 0.0–0.1)
Basophils Relative: 0.5 % (ref 0.0–3.0)
Eosinophils Absolute: 0.4 10*3/uL (ref 0.0–0.7)
Eosinophils Relative: 7.4 % — ABNORMAL HIGH (ref 0.0–5.0)
HCT: 43 % (ref 39.0–52.0)
Hemoglobin: 14.3 g/dL (ref 13.0–17.0)
Lymphocytes Relative: 39.5 % (ref 12.0–46.0)
Lymphs Abs: 2.1 10*3/uL (ref 0.7–4.0)
MCHC: 33.3 g/dL (ref 30.0–36.0)
MCV: 91 fl (ref 78.0–100.0)
Monocytes Absolute: 0.4 10*3/uL (ref 0.1–1.0)
Monocytes Relative: 8 % (ref 3.0–12.0)
Neutro Abs: 2.4 10*3/uL (ref 1.4–7.7)
Neutrophils Relative %: 44.6 % (ref 43.0–77.0)
Platelets: 219 10*3/uL (ref 150.0–400.0)
RBC: 4.72 Mil/uL (ref 4.22–5.81)
RDW: 13 % (ref 11.5–15.5)
WBC: 5.4 10*3/uL (ref 4.0–10.5)

## 2018-08-15 LAB — LIPID PANEL
Cholesterol: 171 mg/dL (ref 0–200)
HDL: 49.9 mg/dL (ref >39.00)
LDL Cholesterol: 98 mg/dL (ref 0–99)
NonHDL: 120.96
Total CHOL/HDL Ratio: 3
Triglycerides: 113 mg/dL (ref 0.0–149.0)
VLDL: 22.6 mg/dL (ref 0.0–40.0)

## 2018-08-15 MED ORDER — CYCLOBENZAPRINE HCL 10 MG PO TABS: 10.0000 mg | ORAL_TABLET | Freq: Three times a day (TID) | ORAL | 3 refills | Status: DC | PRN

## 2018-08-15 MED ORDER — ALBUTEROL SULFATE 108 (90 BASE) MCG/ACT IN AEPB: 2.0000 | INHALATION_SPRAY | Freq: Four times a day (QID) | RESPIRATORY_TRACT | 11 refills | Status: DC | PRN

## 2018-08-15 MED ORDER — GABAPENTIN 100 MG PO CAPS: ORAL_CAPSULE | ORAL | 2 refills | Status: DC

## 2018-08-15 MED ORDER — GABAPENTIN 300 MG PO CAPS: 300.0000 mg | ORAL_CAPSULE | Freq: Every day | ORAL | 2 refills | Status: DC

## 2018-08-15 NOTE — Addendum Note (Signed)
Addended by: Durwin Glaze on: 08/15/2018 09:46 AM   Modules accepted: Orders

## 2018-08-15 NOTE — Patient Instructions (Signed)
Cardiac CT  

## 2018-08-15 NOTE — Progress Notes (Signed)
Subjective:    Jonathan Beasley is a 67 y.o. male who presents today for his Complete Annual Exam.  Current Outpatient Medications:  .  acetic acid 5 % SOLN, Apply 1 application topically daily as needed. With physical therapy for iontophoresis, Disp: 30 mL, Rfl: 5 .  Albuterol Sulfate (PROAIR RESPICLICK) 371 (90 Base) MCG/ACT AEPB, Inhale 2 puffs into the lungs every 6 (six) hours as needed., Disp: 1 each, Rfl: 11 .  aspirin 81 MG tablet, Take 81 mg by mouth daily., Disp: , Rfl:  .  Coenzyme Q10-Levocarnitine (CO Q-10 PLUS PO), Take 10 mg by mouth daily., Disp: , Rfl:  .  cyclobenzaprine (FLEXERIL) 10 MG tablet, Take 1 tablet (10 mg total) by mouth 3 (three) times daily as needed., Disp: 90 tablet, Rfl: 3 .  dexamethasone (DECADRON) 120 MG/30ML SOLN injection, 4mg  (38mL) for iontophoresis under Physical Therapy Supervision, Disp: 30 mL, Rfl: 0 .  gabapentin (NEURONTIN) 100 MG capsule, Take 1-2 capsules, orally, at bedtime or as directed, Disp: 60 capsule, Rfl: 2 .  gabapentin (NEURONTIN) 100 MG capsule, TAKE 2 CAPSULES (200 MG TOTAL) BY MOUTH AT BEDTIME., Disp: 180 capsule, Rfl: 0 .  gabapentin (NEURONTIN) 300 MG capsule, Take 1 capsule (300 mg total) by mouth at bedtime., Disp: 90 capsule, Rfl: 2 .  HYDROcodone-acetaminophen (NORCO) 7.5-325 MG tablet, Take 1 tablet by mouth every 6 (six) hours as needed for moderate pain., Disp: 60 tablet, Rfl: 0 .  meloxicam (MOBIC) 15 MG tablet, TAKE ONE TABLET BY MOUTH AS NEEDED FOR PAIN, Disp: 90 tablet, Rfl: 3 .  Multiple Vitamin (MULTIVITAMIN) tablet, Take 1 tablet by mouth daily., Disp: , Rfl:  .  niacin 500 MG tablet, Take 1 tablet (500 mg total) by mouth at bedtime., Disp: 90 tablet, Rfl: 3 .  nitroGLYCERIN (NITRODUR - DOSED IN MG/24 HR) 0.2 mg/hr patch, APPLY 1/4 PATCH ONTO THE SKIN ONCE DAILY, Disp: 30 patch, Rfl: 0 .  vitamin C (ASCORBIC ACID) 500 MG tablet, Take 500 mg by mouth daily., Disp: , Rfl:   Health Maintenance Due  Topic Date Due    . Hepatitis C Screening  Oct 05, 1951  . COLONOSCOPY  12/31/2001  . TETANUS/TDAP  06/17/2013  . PNA vac Low Risk Adult (1 of 2 - PCV13) 12/31/2016  . INFLUENZA VACCINE  06/15/2018   PMHx, SurgHx, SocialHx, Medications, and Allergies were reviewed in the Visit Navigator and updated as appropriate.   Past Medical History:  Diagnosis Date  . Achilles tendonitis 07/26/2013  . Allergy   . Arthritis   . Hyperlipidemia   . Loose body in knee 06/02/2010  . Mallet finger 08/03/2011  . Restless legs   . Tear of right hamstring 03/01/2017     Past Surgical History:  Procedure Laterality Date  . KNEE ARTHROSCOPY Right 2013     Family History  Problem Relation Age of Onset  . Heart disease Father   . Heart disease Maternal Grandfather   . Heart disease Paternal Grandfather    Social History   Tobacco Use  . Smoking status: Never Smoker  . Smokeless tobacco: Never Used  Substance Use Topics  . Alcohol use: No  . Drug use: No   Review of Systems:   Pertinent items are noted in the HPI. Otherwise, ROS is negative.  Objective:    Vitals:   08/15/18 0753  BP: 138/84  Pulse: (!) 59  Temp: 98.1 F (36.7 C)  SpO2: 99%   Body mass index is 23.04 kg/m.  General  Alert, cooperative, no distress, appears stated age  Head:  Normocephalic, without obvious abnormality, atraumatic  Eyes:  PERRL, conjunctiva/corneas clear, EOM's intact, fundi benign, both eyes       Ears:  Normal TM's and external ear canals, both ears  Nose: Nares normal, septum midline, mucosa normal, no drainage or sinus tenderness  Throat: Lips, mucosa, and tongue normal; teeth and gums normal  Neck: Supple, symmetrical, trachea midline, no adenopathy; thyroid: no enlargement/tenderness/nodules; no carotid bruit or JVD  Back:   Symmetric, no curvature, ROM normal, no CVA tenderness  Lungs:   Clear to auscultation bilaterally, respirations unlabored  Chest Wall:  No tenderness or deformity  Heart:  Regular  rate and rhythm, S1 and S2 normal, no murmur, rub or gallop  Abdomen:   Soft, non-tender, bowel sounds active all four quadrants, no masses, no organomegaly  Extremities: Extremities normal, atraumatic, no cyanosis or edema  Prostate: Not done  Skin: Skin color, texture, turgor normal, no rashes or lesions  Lymph: Cervical, supraclavicular, and axillary nodes normal  Neurologic: CNII-XII grossly intact. Normal strength, sensation and reflexes throughout   AssessmentPlan:   Diagnoses and all orders for this visit:  Routine physical examination  Mixed hyperlipidemia -     Comprehensive metabolic panel -     Lipid panel  Myalgia due to statin  Right hip pain -     CBC with Differential/Platelet  Encounter for immunization -     Flu vaccine HIGH DOSE PF  Dyspnea, unspecified type  Reactive airways dysfunction syndrome, mild intermittent, uncomplicated (HCC) -     Albuterol Sulfate (PROAIR RESPICLICK) 932 (90 Base) MCG/ACT AEPB; Inhale 2 puffs into the lungs every 6 (six) hours as needed.  Right hip pain Comments: Chronic. Medications used prn. Refill today. Orders: -     CBC with Differential/Platelet  Arthritis  Family history of prostate cancer -     PSA  Needs flu shot -     Flu vaccine HIGH DOSE PF  Left lumbar radiculitis -     cyclobenzaprine (FLEXERIL) 10 MG tablet; Take 1 tablet (10 mg total) by mouth 3 (three) times daily as needed. -     gabapentin (NEURONTIN) 300 MG capsule; Take 1 capsule (300 mg total) by mouth at bedtime. -     gabapentin (NEURONTIN) 100 MG capsule; Take 1-2 capsules, orally, at bedtime or as directed   Patient Counseling: [x]   Nutrition: Stressed importance of moderation in sodium/caffeine intake, saturated fat and cholesterol, caloric balance, sufficient intake of fresh fruits, vegetables, and fiber  [x]   Stressed the importance of regular exercise.   []   Substance Abuse: Discussed cessation/primary prevention of tobacco, alcohol, or  other drug use; driving or other dangerous activities under the influence; availability of treatment for abuse.   [x]   Injury prevention: Discussed safety belts, safety helmets, smoke detector, smoking near bedding or upholstery.   []   Sexuality: Discussed sexually transmitted diseases, partner selection, use of condoms, avoidance of unintended pregnancy  and contraceptive alternatives.   [x]   Dental health: Discussed importance of regular tooth brushing, flossing, and dental visits.  [x]   Health maintenance and immunizations reviewed. Please refer to Health maintenance section.    Briscoe Deutscher, DO [place]

## 2018-08-16 MED ORDER — HYDROCODONE-ACETAMINOPHEN 7.5-325 MG PO TABS: 1.0000 | ORAL_TABLET | Freq: Four times a day (QID) | ORAL | 0 refills | Status: DC | PRN

## 2018-10-19 ENCOUNTER — Encounter: Payer: Self-pay | Admitting: Family Medicine

## 2018-10-19 ENCOUNTER — Other Ambulatory Visit: Payer: Self-pay | Admitting: Family Medicine

## 2018-10-19 DIAGNOSIS — M7072 Other bursitis of hip, left hip: Secondary | ICD-10-CM

## 2018-10-19 NOTE — Telephone Encounter (Signed)
Copied from [place] [id number]. Topic: Quick Communication - See Telephone Encounter >> Oct 19, 2018 10:37 AM Ivar Drape wrote: CRM for notification. See Telephone encounter for: 10/19/18. Patient would like a refill on his HYDROcodone-acetaminophen ([place]) 7.5-325 MG tablet medication and have it sent to his preferred pharmacy [facility] on [address].

## 2018-10-19 NOTE — Telephone Encounter (Signed)
Rx request 

## 2018-10-19 NOTE — Telephone Encounter (Signed)
Requested medication (s) are due for refill today: yes  Requested medication (s) are on the active medication list: yes    Last refill: 102/19  #60 0 refills  Future visit scheduled no  Notes to clinic:not delegated  Requested Prescriptions  Pending Prescriptions Disp Refills   HYDROcodone-acetaminophen (NORCO) 7.5-325 MG tablet 60 tablet 0    Sig: Take 1 tablet by mouth every 6 (six) hours as needed for moderate pain.     Not Delegated - Analgesics:  Opioid Agonist Combinations Failed - 10/19/2018 11:38 AM      Failed - This refill cannot be delegated      Failed - Urine Drug Screen completed in last 360 days.      Passed - Valid encounter within last 6 months    Recent Outpatient Visits          2 months ago Routine physical examination   [place], [place], DO   4 months ago [place], [place] D, DO   5 months ago Ischial bursitis of left side   [facility] [place], Legrand Como D, DO   6 months ago Ischial bursitis of left side   [facility] [place], Legrand Como D, DO   6 months ago Left leg pain   [facility] [place], [name], DO

## 2018-10-19 NOTE — Telephone Encounter (Signed)
See note

## 2018-10-20 MED ORDER — HYDROCODONE-ACETAMINOPHEN 7.5-325 MG PO TABS: 1.0000 | ORAL_TABLET | Freq: Four times a day (QID) | ORAL | 0 refills | Status: DC | PRN

## 2018-10-20 NOTE — Telephone Encounter (Signed)
Ok to fill patient has my chart open as well Last app 08/15/18 Last script 08/16/18 #60 no rf No f/u

## 2018-10-22 ENCOUNTER — Encounter: Payer: Self-pay | Admitting: Family Medicine

## 2018-11-24 ENCOUNTER — Telehealth: Payer: Self-pay

## 2018-11-24 DIAGNOSIS — Z1211 Encounter for screening for malignant neoplasm of colon: Secondary | ICD-10-CM

## 2018-11-24 DIAGNOSIS — M7072 Other bursitis of hip, left hip: Secondary | ICD-10-CM

## 2018-11-24 NOTE — Telephone Encounter (Signed)
Left message to return call to our office.  

## 2018-11-24 NOTE — Telephone Encounter (Signed)
Positive results received for cologuard. Results not abstracted yet. Do you want me to call patient give results start referral to GI and abstract results. ppw in your folder for review.

## 2018-11-24 NOTE — Telephone Encounter (Signed)
Please inform patient of positive result and refer to GI. Thanks

## 2018-11-28 NOTE — Telephone Encounter (Signed)
Lm to call office

## 2018-12-05 NOTE — Telephone Encounter (Signed)
Have called patient x 3 l/m ok to send letter to have him call office?

## 2018-12-05 NOTE — Telephone Encounter (Signed)
Okay 

## 2018-12-06 NOTE — Telephone Encounter (Signed)
Letter mailed

## 2018-12-20 ENCOUNTER — Telehealth: Payer: Self-pay

## 2018-12-20 MED ORDER — HYDROCODONE-ACETAMINOPHEN 7.5-325 MG PO TABS: 1.0000 | ORAL_TABLET | Freq: Four times a day (QID) | ORAL | 0 refills | Status: DC | PRN

## 2018-12-20 NOTE — Telephone Encounter (Signed)
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  He after talking friends that are doctors he wants to have colonoscopy. Referral was started. He also requested refill on norco. I have got that pulled up for you.

## 2018-12-20 NOTE — Addendum Note (Signed)
Addended by: Francella Solian on: 12/20/2018 04:19 PM   Modules accepted: Orders

## 2018-12-20 NOTE — Addendum Note (Signed)
Addended by: Briscoe Deutscher R on: 12/20/2018 04:42 PM   Modules accepted: Orders

## 2018-12-22 NOTE — Telephone Encounter (Signed)
Error

## 2019-01-23 ENCOUNTER — Encounter: Payer: Self-pay | Admitting: Family Medicine

## 2019-03-16 ENCOUNTER — Other Ambulatory Visit: Payer: Self-pay | Admitting: Family Medicine

## 2019-03-16 DIAGNOSIS — M7072 Other bursitis of hip, left hip: Secondary | ICD-10-CM

## 2019-03-16 MED ORDER — HYDROCODONE-ACETAMINOPHEN 7.5-325 MG PO TABS: 1.0000 | ORAL_TABLET | Freq: Four times a day (QID) | ORAL | 0 refills | Status: DC | PRN

## 2019-03-16 NOTE — Telephone Encounter (Signed)
Message routed to PCP CMA  

## 2019-03-16 NOTE — Telephone Encounter (Signed)
Left message on voicemail Rx was sent to pharmacy as requested. 

## 2019-03-16 NOTE — Telephone Encounter (Signed)
Left message on voicemail to call office. Needs to schedule virtual visit with Dr. Juleen China and ask if out of medication.

## 2019-03-16 NOTE — Telephone Encounter (Signed)
Please schedule virtual visit. If patient out of medicine, let me know.

## 2019-03-16 NOTE — Telephone Encounter (Signed)
Dr. Juleen China, pt is scheduled for Monday. He is out of medication and it is loaded. Thanks

## 2019-03-18 NOTE — Progress Notes (Deleted)
Virtual Visit via Video   Due to the COVID-19 pandemic, this visit was completed with telemedicine (audio/video) technology to reduce patient and provider exposure as well as to preserve personal protective equipment.   I connected with [name] by a video enabled telemedicine application and verified that I am speaking with the correct person using two identifiers. Location patient: Home Location provider: [facility], Office Persons participating in the virtual visit: Evan, Mackie, DO   I discussed the limitations of evaluation and management by telemedicine and the availability of in person appointments. The patient expressed understanding and agreed to proceed.  Care Team   Patient Care Team: Briscoe Deutscher, DO as PCP - General (Family Medicine)  Subjective:   HPI:   ROS   Patient Active Problem List   Diagnosis Date Noted  . Myalgia due to statin 08/15/2018  . Ischial bursitis of left side 02/13/2018  . Right hip pain 02/13/2018  . Left leg pain 08/29/2017  . Lumbar spondylosis 08/29/2017  . Restless legs   . Arthritis   . HLD (hyperlipidemia) 05/13/2014    Social History   Tobacco Use  . Smoking status: Never Smoker  . Smokeless tobacco: Never Used  Substance Use Topics  . Alcohol use: No    Current Outpatient Medications:  .  acetic acid 5 % SOLN, Apply 1 application topically daily as needed. With physical therapy for iontophoresis, Disp: 30 mL, Rfl: 5 .  Albuterol Sulfate (PROAIR RESPICLICK) 982 (90 Base) MCG/ACT AEPB, Inhale 2 puffs into the lungs every 6 (six) hours as needed., Disp: 1 each, Rfl: 11 .  aspirin 81 MG tablet, Take 81 mg by mouth daily., Disp: , Rfl:  .  Coenzyme Q10-Levocarnitine (CO Q-10 PLUS PO), Take 10 mg by mouth daily., Disp: , Rfl:  .  cyclobenzaprine (FLEXERIL) 10 MG tablet, Take 1 tablet (10 mg total) by mouth 3 (three) times daily as needed., Disp: 90 tablet, Rfl: 3 .  dexamethasone (DECADRON) 120  MG/30ML SOLN injection, 4mg  (62mL) for iontophoresis under Physical Therapy Supervision, Disp: 30 mL, Rfl: 0 .  gabapentin (NEURONTIN) 100 MG capsule, Take 1-2 capsules, orally, at bedtime or as directed, Disp: 60 capsule, Rfl: 2 .  gabapentin (NEURONTIN) 300 MG capsule, Take 1 capsule (300 mg total) by mouth at bedtime., Disp: 90 capsule, Rfl: 2 .  HYDROcodone-acetaminophen (NORCO) 7.5-325 MG tablet, Take 1 tablet by mouth every 6 (six) hours as needed for moderate pain., Disp: 60 tablet, Rfl: 0 .  meloxicam (MOBIC) 15 MG tablet, TAKE ONE TABLET BY MOUTH AS NEEDED FOR PAIN, Disp: 90 tablet, Rfl: 3 .  Multiple Vitamin (MULTIVITAMIN) tablet, Take 1 tablet by mouth daily., Disp: , Rfl:  .  niacin 500 MG tablet, Take 1 tablet (500 mg total) by mouth at bedtime., Disp: 90 tablet, Rfl: 3 .  nitroGLYCERIN (NITRODUR - DOSED IN MG/24 HR) 0.2 mg/hr patch, APPLY 1/4 PATCH ONTO THE SKIN ONCE DAILY, Disp: 30 patch, Rfl: 0 .  vitamin C (ASCORBIC ACID) 500 MG tablet, Take 500 mg by mouth daily., Disp: , Rfl:   Allergies  Allergen Reactions  . Penicillins Other (See Comments)    Childhood- "deathly ill"    Objective:   VITALS: Per patient if applicable, see vitals. GENERAL: Alert, appears well and in no acute distress. HEENT: Atraumatic, conjunctiva clear, no obvious abnormalities on inspection of external nose and ears. NECK: Normal movements of the head and neck. CARDIOPULMONARY: No increased WOB. Speaking in clear  sentences. I:E ratio WNL.  MS: Moves all visible extremities without noticeable abnormality. PSYCH: Pleasant and cooperative, well-groomed. Speech normal rate and rhythm. Affect is appropriate. Insight and judgement are appropriate. Attention is focused, linear, and appropriate.  NEURO: CN grossly intact. Oriented as arrived to appointment on time with no prompting. Moves both UE equally.  SKIN: No obvious lesions, wounds, erythema, or cyanosis noted on face or hands.  Depression screen [facility]  2/9 08/15/2018 06/29/2017 03/03/2016  Decreased Interest 0 0 0  Down, Depressed, Hopeless - 0 0  PHQ - 2 Score 0 0 0    Assessment and Plan:   There are no diagnoses linked to this encounter.  Marland Kitchen COVID-19 Education: The signs and symptoms of COVID-19 were discussed with the patient and how to seek care for testing if needed. The importance of social distancing was discussed today. . Reviewed expectations re: course of current medical issues. . Discussed self-management of symptoms. . Outlined signs and symptoms indicating need for more acute intervention. . Patient verbalized understanding and all questions were answered. Marland Kitchen Health Maintenance issues including appropriate healthy diet, exercise, and smoking avoidance were discussed with patient. . See orders for this visit as documented in the electronic medical record.  Briscoe Deutscher, DO  Records requested if needed. Time spent: *** minutes, of which >50% was spent in obtaining information about his symptoms, reviewing his previous labs, evaluations, and treatments, counseling him about his condition (please see the discussed topics above), and developing a plan to further investigate it; he had a number of questions which I addressed.

## 2019-03-19 ENCOUNTER — Ambulatory Visit: Payer: Medicare Other | Admitting: Family Medicine

## 2019-04-01 ENCOUNTER — Other Ambulatory Visit: Payer: Self-pay | Admitting: Sports Medicine

## 2019-04-01 DIAGNOSIS — M5416 Radiculopathy, lumbar region: Secondary | ICD-10-CM

## 2019-04-04 NOTE — Telephone Encounter (Signed)
Called pt and left VM to call the office.  

## 2019-04-04 NOTE — Telephone Encounter (Signed)
Last OV 08/16/19 Last refill 08/16/19 #60/2 Next OV not scheduled  Canceled last visit earlier this month  Pt needs appointment

## 2019-04-06 NOTE — Telephone Encounter (Signed)
Called x2 and LM for pt to return call regarding refill for gabapentin and how he would like to proceed.

## 2019-04-13 NOTE — Telephone Encounter (Signed)
Not able to reach patient want me to send letter?

## 2019-04-22 ENCOUNTER — Other Ambulatory Visit: Payer: Self-pay | Admitting: Sports Medicine

## 2019-04-22 DIAGNOSIS — M5416 Radiculopathy, lumbar region: Secondary | ICD-10-CM

## 2019-05-02 ENCOUNTER — Other Ambulatory Visit: Payer: Self-pay

## 2019-05-02 ENCOUNTER — Encounter: Payer: Self-pay | Admitting: Family Medicine

## 2019-05-02 ENCOUNTER — Ambulatory Visit (INDEPENDENT_AMBULATORY_CARE_PROVIDER_SITE_OTHER): Payer: Medicare Other | Admitting: Family Medicine

## 2019-05-02 DIAGNOSIS — M7072 Other bursitis of hip, left hip: Secondary | ICD-10-CM

## 2019-05-02 DIAGNOSIS — M5416 Radiculopathy, lumbar region: Secondary | ICD-10-CM | POA: Diagnosis not present

## 2019-05-02 DIAGNOSIS — M1990 Unspecified osteoarthritis, unspecified site: Secondary | ICD-10-CM | POA: Diagnosis not present

## 2019-05-02 MED ORDER — GABAPENTIN 300 MG PO CAPS: 300.0000 mg | ORAL_CAPSULE | Freq: Every day | ORAL | 2 refills | Status: DC

## 2019-05-02 MED ORDER — MELOXICAM 15 MG PO TABS: ORAL_TABLET | ORAL | 3 refills | Status: DC

## 2019-05-02 MED ORDER — CYCLOBENZAPRINE HCL 10 MG PO TABS: 10.0000 mg | ORAL_TABLET | Freq: Three times a day (TID) | ORAL | 3 refills | Status: DC | PRN

## 2019-05-02 MED ORDER — GABAPENTIN 100 MG PO CAPS: ORAL_CAPSULE | ORAL | 2 refills | Status: DC

## 2019-05-02 MED ORDER — HYDROCODONE-ACETAMINOPHEN 7.5-325 MG PO TABS: 1.0000 | ORAL_TABLET | Freq: Four times a day (QID) | ORAL | 0 refills | Status: DC | PRN

## 2019-05-02 NOTE — Progress Notes (Signed)
Virtual Visit via Video   Due to the COVID-19 pandemic, this visit was completed with telemedicine (audio/video) technology to reduce patient and provider exposure as well as to preserve personal protective equipment.   I connected with [name] by a video enabled telemedicine application and verified that I am speaking with the correct person using two identifiers. Location patient: Home Location provider: [facility], Office Persons participating in the virtual visit: Dariush, Mcnellis, DO Lonell Grandchild, [place] acting as scribe for Dr. Briscoe Deutscher.   I discussed the limitations of evaluation and management by telemedicine and the availability of in person appointments. The patient expressed understanding and agreed to proceed.  Care Team   Patient Care Team: Briscoe Deutscher, DO as PCP - General (Family Medicine)  Subjective:   HPI: Patient needed refills of medications. He has no new problems. He has Physical appointment in October. He would like to continue with Dr. Paulla Fore and will investigate where he is working.  He is currently has two prescriptions for gabapentin. One for 300mg  and one for 100mg  he would like to continue with this so that he can take as needed. He does have some breakthrough pain that he takes Norco for as needed. He would like to continue this treatment.  Until he can get appointment with Paulla Fore he will have his son call Dr. Tamala Julian (friend of family) to see if he can be seen sooner.   Review of Systems  Constitutional: Negative for chills and fever.  HENT: Negative for hearing loss and tinnitus.   Eyes: Negative for blurred vision and double vision.  Respiratory: Negative for cough and shortness of breath.   Cardiovascular: Negative for chest pain, palpitations and leg swelling.  Gastrointestinal: Negative for nausea and vomiting.  Genitourinary: Negative for dysuria, frequency and urgency.  Musculoskeletal: Positive for back pain.  Negative for myalgias.       Treated by sports medicine   Skin: Negative for rash.  Neurological: Negative for dizziness and headaches.  Endo/Heme/Allergies: Negative for environmental allergies. Does not bruise/bleed easily.  Psychiatric/Behavioral: Negative for depression and suicidal ideas.    Patient Active Problem List   Diagnosis Date Noted  . Myalgia due to statin 08/15/2018  . Ischial bursitis of left side 02/13/2018  . Right hip pain 02/13/2018  . Left leg pain 08/29/2017  . Lumbar spondylosis 08/29/2017  . Restless legs   . Arthritis   . HLD (hyperlipidemia) 05/13/2014    Social History   Tobacco Use  . Smoking status: Never Smoker  . Smokeless tobacco: Never Used  Substance Use Topics  . Alcohol use: No    Current Outpatient Medications:  .  acetic acid 5 % SOLN, Apply 1 application topically daily as needed. With physical therapy for iontophoresis, Disp: 30 mL, Rfl: 5 .  Albuterol Sulfate (PROAIR RESPICLICK) 119 (90 Base) MCG/ACT AEPB, Inhale 2 puffs into the lungs every 6 (six) hours as needed., Disp: 1 each, Rfl: 11 .  aspirin 81 MG tablet, Take 81 mg by mouth daily., Disp: , Rfl:  .  Coenzyme Q10-Levocarnitine (CO Q-10 PLUS PO), Take 10 mg by mouth daily., Disp: , Rfl:  .  cyclobenzaprine (FLEXERIL) 10 MG tablet, Take 1 tablet (10 mg total) by mouth 3 (three) times daily as needed., Disp: 90 tablet, Rfl: 3 .  dexamethasone (DECADRON) 120 MG/30ML SOLN injection, 4mg  (46mL) for iontophoresis under Physical Therapy Supervision, Disp: 30 mL, Rfl: 0 .  gabapentin (NEURONTIN) 100 MG capsule, Take  1-2 capsules, orally, at bedtime or as directed, Disp: 60 capsule, Rfl: 2 .  gabapentin (NEURONTIN) 300 MG capsule, Take 1 capsule (300 mg total) by mouth at bedtime., Disp: 90 capsule, Rfl: 2 .  HYDROcodone-acetaminophen (NORCO) 7.5-325 MG tablet, Take 1 tablet by mouth every 6 (six) hours as needed for moderate pain., Disp: 60 tablet, Rfl: 0 .  meloxicam (MOBIC) 15 MG tablet,  TAKE ONE TABLET BY MOUTH AS NEEDED FOR PAIN, Disp: 90 tablet, Rfl: 3 .  Multiple Vitamin (MULTIVITAMIN) tablet, Take 1 tablet by mouth daily., Disp: , Rfl:  .  niacin 500 MG tablet, Take 1 tablet (500 mg total) by mouth at bedtime., Disp: 90 tablet, Rfl: 3 .  nitroGLYCERIN (NITRODUR - DOSED IN MG/24 HR) 0.2 mg/hr patch, APPLY 1/4 PATCH ONTO THE SKIN ONCE DAILY, Disp: 30 patch, Rfl: 0 .  vitamin C (ASCORBIC ACID) 500 MG tablet, Take 500 mg by mouth daily., Disp: , Rfl:   Allergies  Allergen Reactions  . Penicillins Other (See Comments)    Childhood- "deathly ill"    Objective:   VITALS: Per patient if applicable, see vitals. GENERAL: Alert, appears well and in no acute distress. HEENT: Atraumatic, conjunctiva clear, no obvious abnormalities on inspection of external nose and ears. NECK: Normal movements of the head and neck. CARDIOPULMONARY: No increased WOB. Speaking in clear sentences. I:E ratio WNL.  MS: Moves all visible extremities without noticeable abnormality. PSYCH: Pleasant and cooperative, well-groomed. Speech normal rate and rhythm. Affect is appropriate. Insight and judgement are appropriate. Attention is focused, linear, and appropriate.  NEURO: CN grossly intact. Oriented as arrived to appointment on time with no prompting. Moves both UE equally.  SKIN: No obvious lesions, wounds, erythema, or cyanosis noted on face or hands.  Depression screen [facility] 2/9 05/02/2019 08/15/2018 06/29/2017  Decreased Interest 0 0 0  Down, Depressed, Hopeless 0 - 0  PHQ - 2 Score 0 0 0  Altered sleeping 0 - -  Tired, decreased energy 0 - -  Change in appetite 0 - -  Feeling bad or failure about yourself  0 - -  Trouble concentrating 0 - -  Moving slowly or fidgety/restless 0 - -  Suicidal thoughts 0 - -  PHQ-9 Score 0 - -  Difficult doing work/chores Not difficult at all - -   Assessment and Plan:   Clenton was seen today for follow-up.  Diagnoses and all orders for this  visit:  Arthritis -     meloxicam (MOBIC) 15 MG tablet; TAKE ONE TABLET BY MOUTH AS NEEDED FOR PAIN  Left lumbar radiculitis -     gabapentin (NEURONTIN) 300 MG capsule; Take 1 capsule (300 mg total) by mouth at bedtime. -     gabapentin (NEURONTIN) 100 MG capsule; Take 1-2 capsules, orally, at bedtime or as directed -     cyclobenzaprine (FLEXERIL) 10 MG tablet; Take 1 tablet (10 mg total) by mouth 3 (three) times daily as needed.  Ischial bursitis of left side -     HYDROcodone-acetaminophen (NORCO) 7.5-325 MG tablet; Take 1 tablet by mouth every 6 (six) hours as needed for moderate pain.   Marland Kitchen COVID-19 Education: The signs and symptoms of COVID-19 were discussed with the patient and how to seek care for testing if needed. The importance of social distancing was discussed today. . Reviewed expectations re: course of current medical issues. . Discussed self-management of symptoms. . Outlined signs and symptoms indicating need for more acute intervention. . Patient verbalized understanding  and all questions were answered. Marland Kitchen Health Maintenance issues including appropriate healthy diet, exercise, and smoking avoidance were discussed with patient. . See orders for this visit as documented in the electronic medical record.  Briscoe Deutscher, DO  Records requested if needed. Time spent: 25 minutes, of which >50% was spent in obtaining information about his symptoms, reviewing his previous labs, evaluations, and treatments, counseling him about his condition (please see the discussed topics above), and developing a plan to further investigate it; he had a number of questions which I addressed.

## 2019-05-08 ENCOUNTER — Encounter: Payer: Self-pay | Admitting: Family Medicine

## 2019-05-24 ENCOUNTER — Other Ambulatory Visit: Payer: Self-pay | Admitting: Family Medicine

## 2019-05-24 DIAGNOSIS — M5416 Radiculopathy, lumbar region: Secondary | ICD-10-CM

## 2019-06-28 DIAGNOSIS — L821 Other seborrheic keratosis: Secondary | ICD-10-CM | POA: Diagnosis not present

## 2019-06-28 DIAGNOSIS — D1801 Hemangioma of skin and subcutaneous tissue: Secondary | ICD-10-CM | POA: Diagnosis not present

## 2019-06-28 DIAGNOSIS — L819 Disorder of pigmentation, unspecified: Secondary | ICD-10-CM | POA: Diagnosis not present

## 2019-06-28 DIAGNOSIS — L820 Inflamed seborrheic keratosis: Secondary | ICD-10-CM | POA: Diagnosis not present

## 2019-06-28 DIAGNOSIS — L814 Other melanin hyperpigmentation: Secondary | ICD-10-CM | POA: Diagnosis not present

## 2019-06-28 DIAGNOSIS — D225 Melanocytic nevi of trunk: Secondary | ICD-10-CM | POA: Diagnosis not present

## 2019-06-28 DIAGNOSIS — L570 Actinic keratosis: Secondary | ICD-10-CM | POA: Diagnosis not present

## 2019-07-20 ENCOUNTER — Ambulatory Visit (INDEPENDENT_AMBULATORY_CARE_PROVIDER_SITE_OTHER): Payer: Medicare Other | Admitting: Family Medicine

## 2019-07-20 DIAGNOSIS — M7072 Other bursitis of hip, left hip: Secondary | ICD-10-CM | POA: Diagnosis not present

## 2019-07-20 MED ORDER — HYDROCODONE-ACETAMINOPHEN 7.5-325 MG PO TABS: 1.0000 | ORAL_TABLET | Freq: Four times a day (QID) | ORAL | 0 refills | Status: DC | PRN

## 2019-07-20 NOTE — Progress Notes (Signed)
    Chief Complaint:  Jonathan Beasley is a 68 y.o. male who presents today for a virtual office visit with a chief complaint of back pain.   Assessment/Plan:  Back Pain No red flags.  Database reviewed with no red flags as well.  He is on chronic Norco.  Will refill today.  He will need to follow-up with his PCP soon.  Has appointment scheduled for next week.  He will continue other medications including Flexeril, gabapentin, and Mobic.  Discussed reasons to return to care.  Follow-up as needed.    Subjective:  HPI:  Back Pain  Symptoms started about a week ago.  Went on a run and was doing stretches when he noticed sudden pain in his left lower back.  Symptoms have improved slightly since then.  He is currently taking Norco for restless legs.  This is been helping with his pain.  He also has a history of lumbar spondylosis and ischial tuberosity inflammation.  He is also currently on gabapentin at night in addition to meloxicam and Flexeril.  All these medications seem to be helping.  He has been taking Norco 3 times per day and needs a refill today.  No reported bowel or bladder incontinence.  No reported urinary retention.  No other obvious alleviating or aggravating factors.  ROS: Per HPI  PMH: He reports that he has never smoked. He has never used smokeless tobacco. He reports that he does not drink alcohol or use drugs.      Objective/Observations  Physical Exam: Gen: NAD, resting comfortably Pulm: Normal work of breathing Neuro: Grossly normal, moves all extremities Psych: Normal affect and thought content  No results found for this or any previous visit (from the past 24 hour(s)).   Virtual Visit via Video   I connected with Jonathan Beasley on 07/20/19 at 11:20 AM EDT by a video enabled telemedicine application and verified that I am speaking with the correct person using two identifiers. I discussed the limitations of evaluation and management by telemedicine and the  availability of in person appointments. The patient expressed understanding and agreed to proceed.   Patient location: Home Provider location: [place] participating in the virtual visit: Myself and Patient     Algis Greenhouse. Jerline Pain, MD 07/20/2019 11:09 AM

## 2019-07-26 ENCOUNTER — Ambulatory Visit: Payer: Medicare Other | Admitting: Family Medicine

## 2019-08-09 DIAGNOSIS — Z85828 Personal history of other malignant neoplasm of skin: Secondary | ICD-10-CM | POA: Diagnosis not present

## 2019-08-09 DIAGNOSIS — L738 Other specified follicular disorders: Secondary | ICD-10-CM | POA: Diagnosis not present

## 2019-08-09 DIAGNOSIS — L308 Other specified dermatitis: Secondary | ICD-10-CM | POA: Diagnosis not present

## 2019-08-09 DIAGNOSIS — L570 Actinic keratosis: Secondary | ICD-10-CM | POA: Diagnosis not present

## 2019-08-09 DIAGNOSIS — L578 Other skin changes due to chronic exposure to nonionizing radiation: Secondary | ICD-10-CM | POA: Diagnosis not present

## 2019-08-19 NOTE — Progress Notes (Signed)
Subjective:    Jonathan Beasley is a 68 y.o. male who presents today for his Complete Annual Exam.  Continues to run. Has a race this week. Joint pain, neck and low back DDD requiring pain control. Uses PT, stretching, Mobic at baseline. Gabapentin 300-400 mg po q hs. Norco once daily at baseline and q 6 hours if flare. Flexeril prn night. No sedation. Son is PT so is helpful. He would love to get off all medications but pain becomes too severe when he tries to wean. Has tried ESI lumbar in the past without relief.   Current Outpatient Medications:  .  acetic acid 5 % SOLN, Apply 1 application topically daily as needed. With physical therapy for iontophoresis, Disp: 30 mL, Rfl: 5 .  Albuterol Sulfate (PROAIR RESPICLICK) 123XX123 (90 Base) MCG/ACT AEPB, Inhale 2 puffs into the lungs every 6 (six) hours as needed., Disp: 1 each, Rfl: 11 .  aspirin 81 MG tablet, Take 81 mg by mouth daily., Disp: , Rfl:  .  Coenzyme Q10-Levocarnitine (CO Q-10 PLUS PO), Take 10 mg by mouth daily., Disp: , Rfl:  .  cyclobenzaprine (FLEXERIL) 10 MG tablet, Take 1 tablet (10 mg total) by mouth 3 (three) times daily as needed., Disp: 90 tablet, Rfl: 3 .  erythromycin ophthalmic ointment, APPLY A SMALL AMOUNT INTO AFFECTED EYE TWICE A DAY AS NEEDED, Disp: , Rfl:  .  gabapentin (NEURONTIN) 100 MG capsule, 1-2 at bed time as needed., Disp: 180 capsule, Rfl: 3 .  gabapentin (NEURONTIN) 300 MG capsule, Take 1 capsule (300 mg total) by mouth at bedtime., Disp: 90 capsule, Rfl: 3 .  hydrocortisone 2.5 % cream, APPLY A SMALL AMOUNT TO EYELIDS TWICE A DAY UP TO 5 DAYS AS NEEDED FOR FLARES, Disp: , Rfl:  .  meloxicam (MOBIC) 15 MG tablet, TAKE ONE TABLET BY MOUTH AS NEEDED FOR PAIN, Disp: 90 tablet, Rfl: 3 .  Multiple Vitamin (MULTIVITAMIN) tablet, Take 1 tablet by mouth daily., Disp: , Rfl:  .  niacin 500 MG tablet, Take 1 tablet (500 mg total) by mouth at bedtime., Disp: 90 tablet, Rfl: 3 .  vitamin C (ASCORBIC ACID) 500 MG  tablet, Take 500 mg by mouth daily., Disp: , Rfl:  .  HYDROcodone-acetaminophen (NORCO) 7.5-325 MG tablet, Take 1 tablet by mouth every 6 (six) hours as needed for moderate pain or severe pain., Disp: 60 tablet, Rfl: 0 .  HYDROcodone-acetaminophen (NORCO) 7.5-325 MG tablet, Take 1 tablet by mouth every 6 (six) hours as needed for moderate pain or severe pain., Disp: 60 tablet, Rfl: 0  Health Maintenance Due  Topic Date Due  . Hepatitis C Screening  11-24-1950  . COLONOSCOPY  12/31/2001  . INFLUENZA VACCINE  06/16/2019   PMHx, SurgHx, SocialHx, Medications, and Allergies were reviewed in the Visit Navigator and updated as appropriate.   Past Medical History:  Diagnosis Date  . Achilles tendonitis 07/26/2013  . Allergy   . Arthritis   . Hyperlipidemia   . Loose body in knee 06/02/2010  . Mallet finger 08/03/2011  . Restless legs   . Tear of right hamstring 03/01/2017     Past Surgical History:  Procedure Laterality Date  . KNEE ARTHROSCOPY Right 2013     Family History  Problem Relation Age of Onset  . Heart disease Father   . Heart disease Maternal Grandfather   . Heart disease Paternal Grandfather    Social History   Tobacco Use  . Smoking status: Never Smoker  .  Smokeless tobacco: Never Used  Substance Use Topics  . Alcohol use: No  . Drug use: No    Review of Systems:   Pertinent items are noted in the HPI. Otherwise, ROS is negative.  Objective:   Vitals:   08/20/19 0759  BP: 126/84  Pulse: 68  Temp: 97.8 F (36.6 C)  SpO2: 98%   Body mass index is 23.18 kg/m.  General  Alert, cooperative, no distress, appears stated age  Head:  Normocephalic, without obvious abnormality, atraumatic  Eyes:  PERRL, conjunctiva/corneas clear, EOM's intact, fundi benign, both eyes       Ears:  Normal TM's and external ear canals, both ears  Nose: Nares normal, septum midline, mucosa normal, no drainage or sinus tenderness  Throat: Lips, mucosa, and tongue normal;  teeth and gums normal  Neck: Supple, symmetrical, trachea midline, no adenopathy; thyroid: no enlargement/tenderness/nodules; no carotid bruit or JVD  Back:   Symmetric, no curvature, ROM normal, no CVA tenderness  Lungs:   Clear to auscultation bilaterally, respirations unlabored  Chest Wall:  No tenderness or deformity  Heart:  Regular rate and rhythm, S1 and S2 normal, no murmur, rub or gallop  Abdomen:   Soft, non-tender, bowel sounds active all four quadrants, no masses, no organomegaly  Extremities: Extremities normal, atraumatic, no cyanosis or edema  Prostate: Not done  Skin: Skin color, texture, turgor normal, no rashes or lesions  Lymph: Cervical, supraclavicular, and axillary nodes normal  Neurologic: CNII-XII grossly intact. Normal strength, sensation and reflexes throughout   AssessmentPlan:   Draydon was seen today for annual exam.  Diagnoses and all orders for this visit:  Routine physical examination  Mixed hyperlipidemia -     Comprehensive metabolic panel -     Lipid panel  Myalgia due to statin  Restless legs -     CBC with Differential/Platelet  Family history of prostate cancer -     PSA  Reactive airways dysfunction syndrome, mild intermittent, uncomplicated (HCC) -     Albuterol Sulfate (PROAIR RESPICLICK) 123XX123 (90 Base) MCG/ACT AEPB; Inhale 2 puffs into the lungs every 6 (six) hours as needed.  Left lumbar radiculitis -     cyclobenzaprine (FLEXERIL) 10 MG tablet; Take 1 tablet (10 mg total) by mouth 3 (three) times daily as needed. -     gabapentin (NEURONTIN) 100 MG capsule; 1-2 at bed time as needed. -     gabapentin (NEURONTIN) 300 MG capsule; Take 1 capsule (300 mg total) by mouth at bedtime.  Arthritis -     meloxicam (MOBIC) 15 MG tablet; TAKE ONE TABLET BY MOUTH AS NEEDED FOR PAIN -     HYDROcodone-acetaminophen (NORCO) 7.5-325 MG tablet; Take 1 tablet by mouth every 6 (six) hours as needed for moderate pain or severe pain. -      HYDROcodone-acetaminophen (NORCO) 7.5-325 MG tablet; Take 1 tablet by mouth every 6 (six) hours as needed for moderate pain or severe pain.  Radicular pain of left upper extremity -     HYDROcodone-acetaminophen (NORCO) 7.5-325 MG tablet; Take 1 tablet by mouth every 6 (six) hours as needed for moderate pain or severe pain. -     HYDROcodone-acetaminophen (NORCO) 7.5-325 MG tablet; Take 1 tablet by mouth every 6 (six) hours as needed for moderate pain or severe pain.  Need for immunization against influenza -     Flu Vaccine QUAD High Dose(Fluad)  Encounter for hepatitis C virus screening test for high risk patient -  Hepatitis C antibody  Patient Counseling: [x]   Nutrition: Stressed importance of moderation in sodium/caffeine intake, saturated fat and cholesterol, caloric balance, sufficient intake of fresh fruits, vegetables, and fiber  [x]   Stressed the importance of regular exercise.   []   Substance Abuse: Discussed cessation/primary prevention of tobacco, alcohol, or other drug use; driving or other dangerous activities under the influence; availability of treatment for abuse.   [x]   Injury prevention: Discussed safety belts, safety helmets, smoke detector, smoking near bedding or upholstery.   []   Sexuality: Discussed sexually transmitted diseases, partner selection, use of condoms, avoidance of unintended pregnancy  and contraceptive alternatives.   [x]   Dental health: Discussed importance of regular tooth brushing, flossing, and dental visits.  [x]   Health maintenance and immunizations reviewed. Please refer to Health maintenance section.   Briscoe Deutscher, DO [place]

## 2019-08-20 ENCOUNTER — Encounter: Payer: Self-pay | Admitting: Family Medicine

## 2019-08-20 ENCOUNTER — Ambulatory Visit (INDEPENDENT_AMBULATORY_CARE_PROVIDER_SITE_OTHER): Payer: Medicare Other | Admitting: Family Medicine

## 2019-08-20 ENCOUNTER — Other Ambulatory Visit: Payer: Self-pay

## 2019-08-20 VITALS — BP 126/84 | HR 68 | Temp 97.8°F | Ht 71.0 in | Wt 166.2 lb

## 2019-08-20 DIAGNOSIS — Z0000 Encounter for general adult medical examination without abnormal findings: Secondary | ICD-10-CM

## 2019-08-20 DIAGNOSIS — G2581 Restless legs syndrome: Secondary | ICD-10-CM

## 2019-08-20 DIAGNOSIS — Z9189 Other specified personal risk factors, not elsewhere classified: Secondary | ICD-10-CM

## 2019-08-20 DIAGNOSIS — M791 Myalgia, unspecified site: Secondary | ICD-10-CM

## 2019-08-20 DIAGNOSIS — Z23 Encounter for immunization: Secondary | ICD-10-CM

## 2019-08-20 DIAGNOSIS — M5416 Radiculopathy, lumbar region: Secondary | ICD-10-CM

## 2019-08-20 DIAGNOSIS — Z1159 Encounter for screening for other viral diseases: Secondary | ICD-10-CM

## 2019-08-20 DIAGNOSIS — E782 Mixed hyperlipidemia: Secondary | ICD-10-CM | POA: Diagnosis not present

## 2019-08-20 DIAGNOSIS — J683 Other acute and subacute respiratory conditions due to chemicals, gases, fumes and vapors: Secondary | ICD-10-CM

## 2019-08-20 DIAGNOSIS — M792 Neuralgia and neuritis, unspecified: Secondary | ICD-10-CM

## 2019-08-20 DIAGNOSIS — T466X5A Adverse effect of antihyperlipidemic and antiarteriosclerotic drugs, initial encounter: Secondary | ICD-10-CM

## 2019-08-20 DIAGNOSIS — Z8042 Family history of malignant neoplasm of prostate: Secondary | ICD-10-CM

## 2019-08-20 DIAGNOSIS — M1990 Unspecified osteoarthritis, unspecified site: Secondary | ICD-10-CM

## 2019-08-20 LAB — LIPID PANEL
Cholesterol: 209 mg/dL — ABNORMAL HIGH (ref 0–200)
HDL: 41.9 mg/dL (ref >39.00)
NonHDL: 167.17
Total CHOL/HDL Ratio: 5
Triglycerides: 225 mg/dL — ABNORMAL HIGH (ref 0.0–149.0)
VLDL: 45 mg/dL — ABNORMAL HIGH (ref 0.0–40.0)

## 2019-08-20 LAB — COMPREHENSIVE METABOLIC PANEL
ALT: 28 U/L (ref 0–53)
AST: 31 U/L (ref 0–37)
Albumin: 4.3 g/dL (ref 3.5–5.2)
Alkaline Phosphatase: 56 U/L (ref 39–117)
BUN: 20 mg/dL (ref 6–23)
CO2: 30 mEq/L (ref 19–32)
Calcium: 9.6 mg/dL (ref 8.4–10.5)
Chloride: 100 mEq/L (ref 96–112)
Creatinine, Ser: 0.94 mg/dL (ref 0.40–1.50)
GFR: 79.9 mL/min (ref >60.00)
Glucose, Bld: 87 mg/dL (ref 70–99)
Potassium: 4.3 mEq/L (ref 3.5–5.1)
Sodium: 138 mEq/L (ref 135–145)
Total Bilirubin: 0.7 mg/dL (ref 0.2–1.2)
Total Protein: 7.5 g/dL (ref 6.0–8.3)

## 2019-08-20 LAB — CBC WITH DIFFERENTIAL/PLATELET
Basophils Absolute: 0 10*3/uL (ref 0.0–0.1)
Basophils Relative: 0.5 % (ref 0.0–3.0)
Eosinophils Absolute: 0.4 10*3/uL (ref 0.0–0.7)
Eosinophils Relative: 7.8 % — ABNORMAL HIGH (ref 0.0–5.0)
HCT: 46.1 % (ref 39.0–52.0)
Hemoglobin: 15.6 g/dL (ref 13.0–17.0)
Lymphocytes Relative: 36 % (ref 12.0–46.0)
Lymphs Abs: 1.9 10*3/uL (ref 0.7–4.0)
MCHC: 33.8 g/dL (ref 30.0–36.0)
MCV: 90 fl (ref 78.0–100.0)
Monocytes Absolute: 0.4 10*3/uL (ref 0.1–1.0)
Monocytes Relative: 8.1 % (ref 3.0–12.0)
Neutro Abs: 2.5 10*3/uL (ref 1.4–7.7)
Neutrophils Relative %: 47.6 % (ref 43.0–77.0)
Platelets: 237 10*3/uL (ref 150.0–400.0)
RBC: 5.13 Mil/uL (ref 4.22–5.81)
RDW: 12.4 % (ref 11.5–15.5)
WBC: 5.2 10*3/uL (ref 4.0–10.5)

## 2019-08-20 LAB — LDL CHOLESTEROL, DIRECT: Direct LDL: 117 mg/dL

## 2019-08-20 LAB — PSA: PSA: 0.71 ng/mL (ref 0.10–4.00)

## 2019-08-20 MED ORDER — HYDROCODONE-ACETAMINOPHEN 7.5-325 MG PO TABS: 1.0000 | ORAL_TABLET | Freq: Four times a day (QID) | ORAL | 0 refills | Status: AC | PRN

## 2019-08-20 MED ORDER — HYDROCODONE-ACETAMINOPHEN 7.5-325 MG PO TABS: 1.0000 | ORAL_TABLET | Freq: Four times a day (QID) | ORAL | 0 refills | Status: DC | PRN

## 2019-08-20 MED ORDER — MELOXICAM 15 MG PO TABS: ORAL_TABLET | ORAL | 3 refills | Status: DC

## 2019-08-20 MED ORDER — GABAPENTIN 100 MG PO CAPS: ORAL_CAPSULE | ORAL | 3 refills | Status: DC

## 2019-08-20 MED ORDER — GABAPENTIN 300 MG PO CAPS: 300.0000 mg | ORAL_CAPSULE | Freq: Every day | ORAL | 3 refills | Status: DC

## 2019-08-20 MED ORDER — PROAIR RESPICLICK 108 (90 BASE) MCG/ACT IN AEPB: 2.0000 | INHALATION_SPRAY | Freq: Four times a day (QID) | RESPIRATORY_TRACT | 11 refills | Status: DC | PRN

## 2019-08-20 MED ORDER — CYCLOBENZAPRINE HCL 10 MG PO TABS: 10.0000 mg | ORAL_TABLET | Freq: Three times a day (TID) | ORAL | 3 refills | Status: DC | PRN

## 2019-08-20 NOTE — Patient Instructions (Addendum)
We have several Provider options: Male doctors Dr. Rogers Blocker Dr. Jonni Sanger  Male PA Inda Coke, [place] Male Doctor: Dr. Jerline Pain Any are good options just let our office know and we will change your upcomming appointment with one of them.    DO in options at other  [facility] offices   [place]  Dr. Letta Median Dr. Luetta Nutting   [facility] Dr. Howard Pouch  [place]  [address] [place]  Influenza (Flu) Vaccine (Inactivated or Recombinant): What You Need to Know 1. Why get vaccinated? Influenza vaccine can prevent influenza (flu). Flu is a contagious disease that spreads around the [place] every year, usually between October and May. Anyone can get the flu, but it is more dangerous for some people. Infants and young children, people 55 years of age and older, pregnant women, and people with certain health conditions or a weakened immune system are at greatest risk of flu complications. Pneumonia, bronchitis, sinus infections and ear infections are examples of flu-related complications. If you have a medical condition, such as heart disease, cancer or diabetes, flu can make it worse. Flu can cause fever and chills, sore throat, muscle aches, fatigue, cough, headache, and runny or stuffy nose. Some people may have vomiting and diarrhea, though this is more common in children than adults. Each year thousands of people in the [place] die from flu, and many more are hospitalized. Flu vaccine prevents millions of illnesses and flu-related visits to the doctor each year. 2. Influenza vaccine CDC recommends everyone 55 months of age and older get vaccinated every flu season. Children 6 months through 26 years of age may need 2 doses during a single flu season. Everyone else needs only 1 dose each flu season. It takes about 2 weeks for protection to develop after vaccination. There are many flu viruses, and they are always changing. Each year a new flu vaccine is made to protect against  three or four viruses that are likely to cause disease in the upcoming flu season. Even when the vaccine doesn't exactly match these viruses, it may still provide some protection. Influenza vaccine does not cause flu. Influenza vaccine may be given at the same time as other vaccines. 3. Talk with your health care provider Tell your vaccine provider if the person getting the vaccine:  Has had an allergic reaction after a previous dose of influenza vaccine, or has any severe, life-threatening allergies.  Has ever had Guillain-Barr Syndrome (also called GBS). In some cases, your health care provider may decide to postpone influenza vaccination to a future visit. People with minor illnesses, such as a cold, may be vaccinated. People who are moderately or severely ill should usually wait until they recover before getting influenza vaccine. Your health care provider can give you more information. 4. Risks of a vaccine reaction  Soreness, redness, and swelling where shot is given, fever, muscle aches, and headache can happen after influenza vaccine.  There may be a very small increased risk of Guillain-Barr Syndrome (GBS) after inactivated influenza vaccine (the flu shot). Young children who get the flu shot along with pneumococcal vaccine (PCV13), and/or DTaP vaccine at the same time might be slightly more likely to have a seizure caused by fever. Tell your health care provider if a child who is getting flu vaccine has ever had a seizure. People sometimes faint after medical procedures, including vaccination. Tell your provider if you feel dizzy or have vision changes or ringing in the ears. As with any medicine, there  is a very remote chance of a vaccine causing a severe allergic reaction, other serious injury, or death. 5. What if there is a serious problem? An allergic reaction could occur after the vaccinated person leaves the clinic. If you see signs of a severe allergic reaction (hives,  swelling of the face and throat, difficulty breathing, a fast heartbeat, dizziness, or weakness), call 9-1-1 and get the person to the nearest hospital. For other signs that concern you, call your health care provider. Adverse reactions should be reported to the Vaccine Adverse Event Reporting System (VAERS). Your health care provider will usually file this report, or you can do it yourself. Visit the VAERS website at www.vaers.SamedayNews.es or call 830-858-9338.VAERS is only for reporting reactions, and VAERS staff do not give medical advice. 6. The National Vaccine Injury Compensation Program The [employer] Vaccine Injury Compensation Program (VICP) is a federal program that was created to compensate people who may have been injured by certain vaccines. Visit the VICP website at GoldCloset.com.ee or call (843)743-5675 to learn about the program and about filing a claim. There is a time limit to file a claim for compensation. 7. How can I learn more?  Ask your healthcare provider.  Call your local or state health department.  Contact the Centers for Disease Control and Prevention (CDC): ? Call 5742120461 (1-800-CDC-INFO) or ? Visit CDC's https://gibson.com/ Vaccine Information Statement (Interim) Inactivated Influenza Vaccine (06/29/2018) This information is not intended to replace advice given to you by your health care provider. Make sure you discuss any questions you have with your health care provider. Document Released: 08/26/2006 Document Revised: 02/20/2019 Document Reviewed: 07/03/2018 Elsevier Patient Education  2020 [employer].

## 2019-09-05 ENCOUNTER — Encounter: Payer: Self-pay | Admitting: Family Medicine

## 2019-09-06 ENCOUNTER — Other Ambulatory Visit: Payer: Self-pay

## 2019-09-06 DIAGNOSIS — E782 Mixed hyperlipidemia: Secondary | ICD-10-CM

## 2019-09-10 DIAGNOSIS — H524 Presbyopia: Secondary | ICD-10-CM | POA: Diagnosis not present

## 2019-09-10 DIAGNOSIS — H5213 Myopia, bilateral: Secondary | ICD-10-CM | POA: Diagnosis not present

## 2019-09-10 DIAGNOSIS — H2513 Age-related nuclear cataract, bilateral: Secondary | ICD-10-CM | POA: Diagnosis not present

## 2019-11-10 IMAGING — MR MR LUMBAR SPINE W/O CM
4 of 5 series · 18 of 48 positions shown · non-contrast
Comparison: Prior radiograph from 03/01/2017.

CLINICAL DATA: Initial evaluation for low back pain extending into
left lower extremity for 4-5 months.

EXAM:
MRI LUMBAR SPINE WITHOUT CONTRAST
TECHNIQUE: Multiplanar, multisequence MR imaging of the lumbar spine was
performed. No intravenous contrast was administered.

[Series 5: T2 · sagittal · 4.0mm · 0.73mm/px · 6 of 17 slices shown (1 of 2)]
[im 1/17]
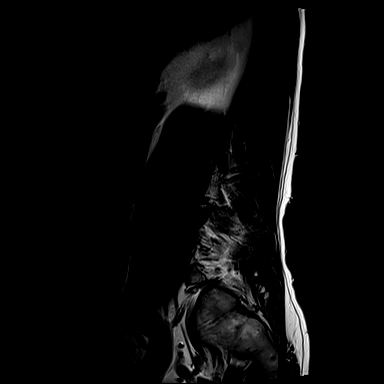
[im 4/17]
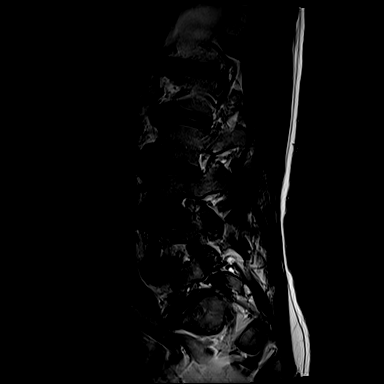
[im 7/17]
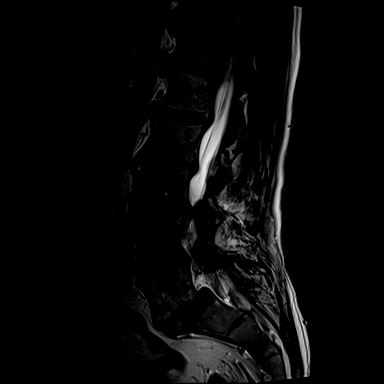
[im 10/17]
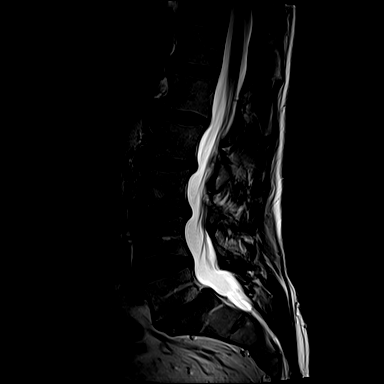
[im 13/17]
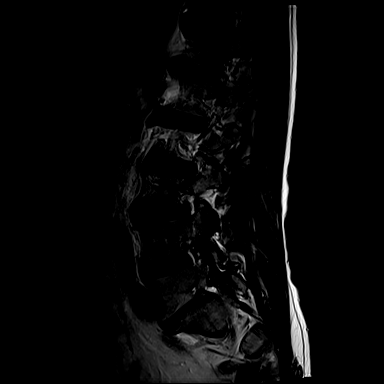
[im 17/17]
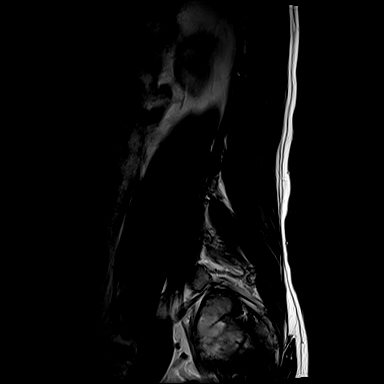

[Series 7: T1 · sagittal · 4.0mm · 0.73mm/px · 3 of 17 slices shown (1 of 2)]
[im 4/17]
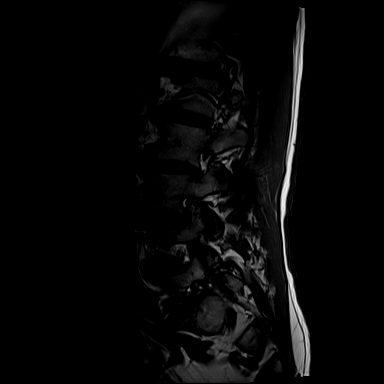
[im 10/17]
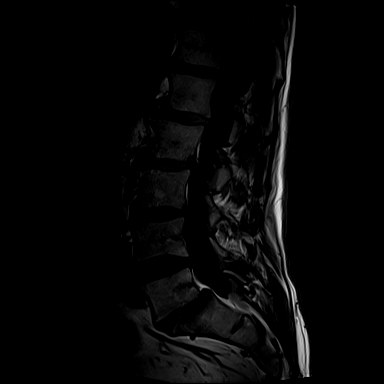
[im 17/17]
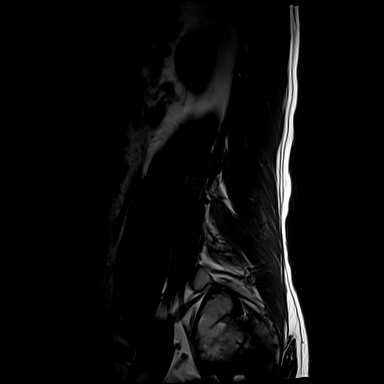

[Series 10: T2 · axial · 4.0mm · 0.28mm/px · z∈[-81,+118]mm · 6 of 41 slices shown (2 of 2)]
[im 1/41]
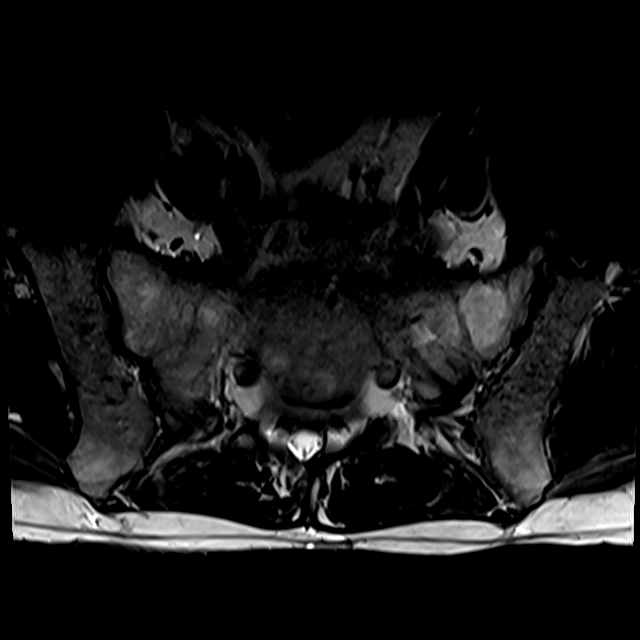
[im 6/41]
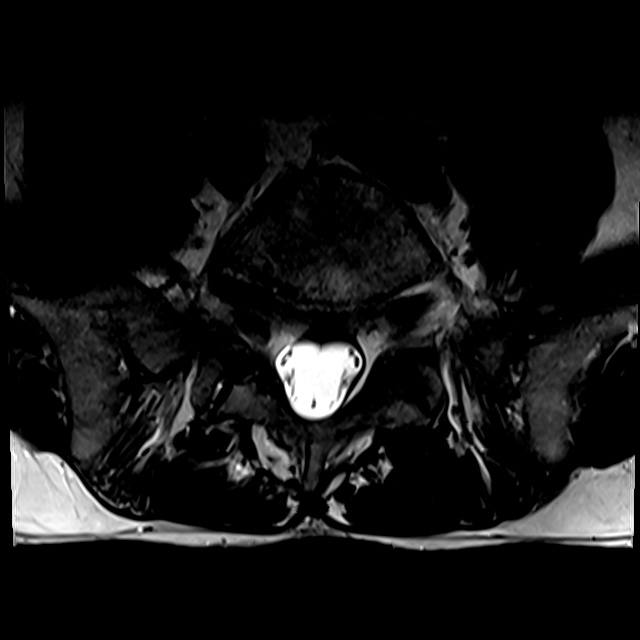
[im 12/41]
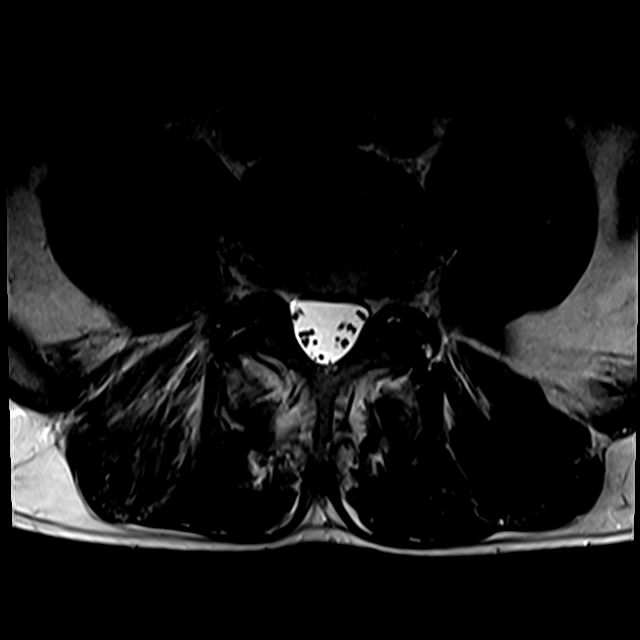
[im 18/41]
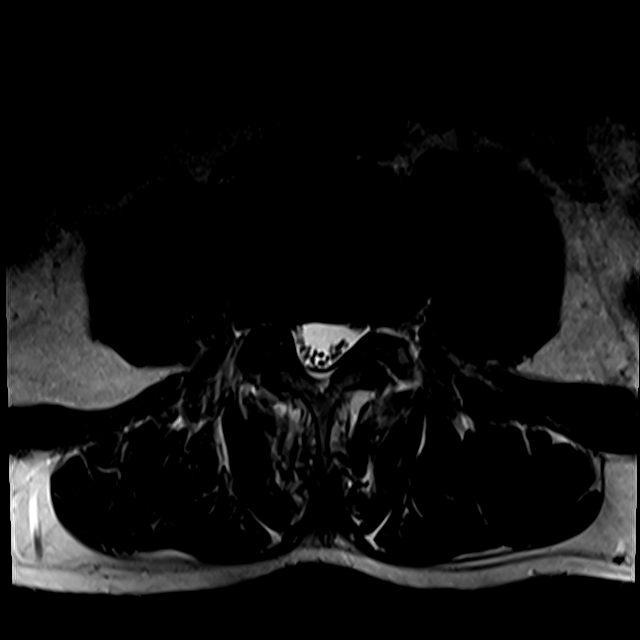
[im 21/41]
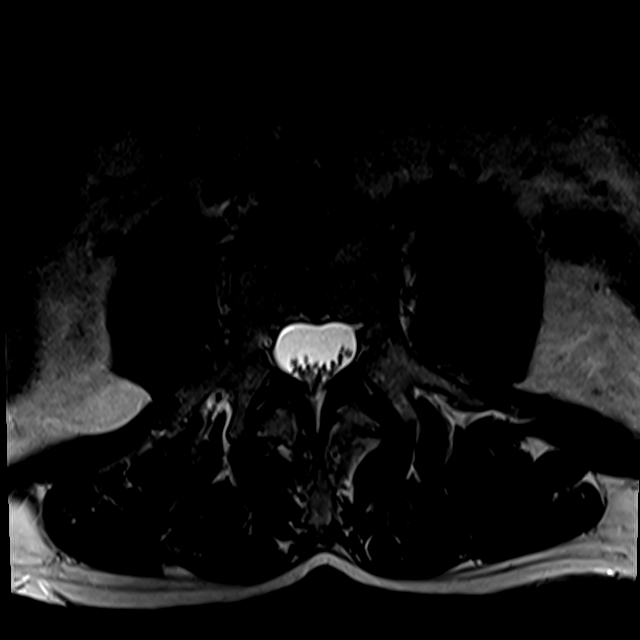
[im 35/41]
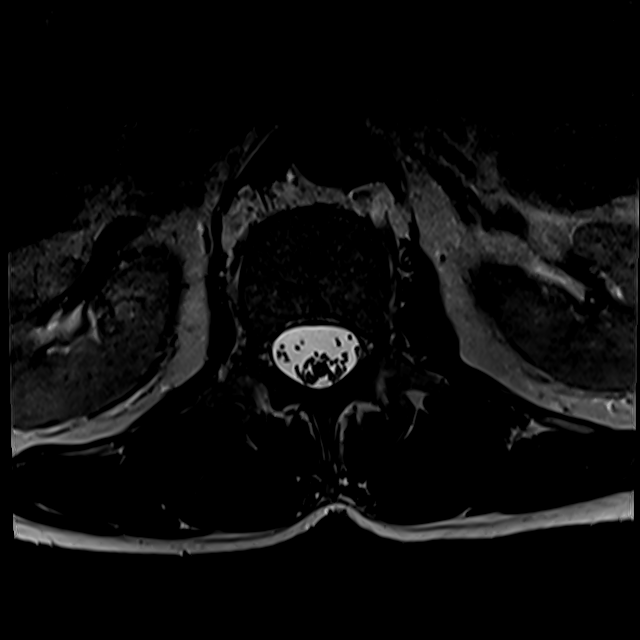

[Series 13: T1 · axial · 4.0mm · 0.28mm/px · z∈[-56,+118]mm · 3 of 41 slices shown (2 of 2)]
[im 6/41]
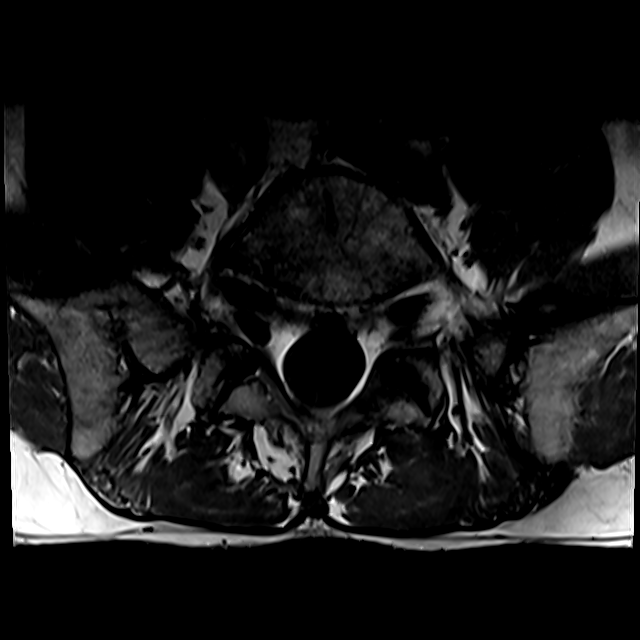
[im 21/41]
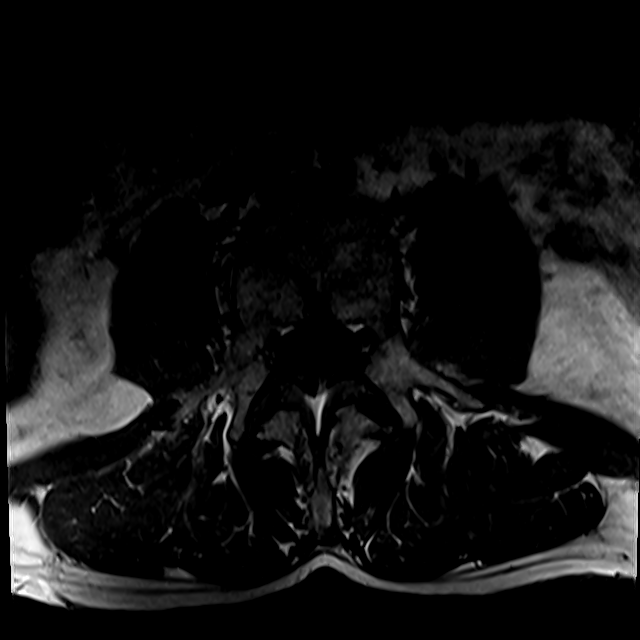
[im 35/41]
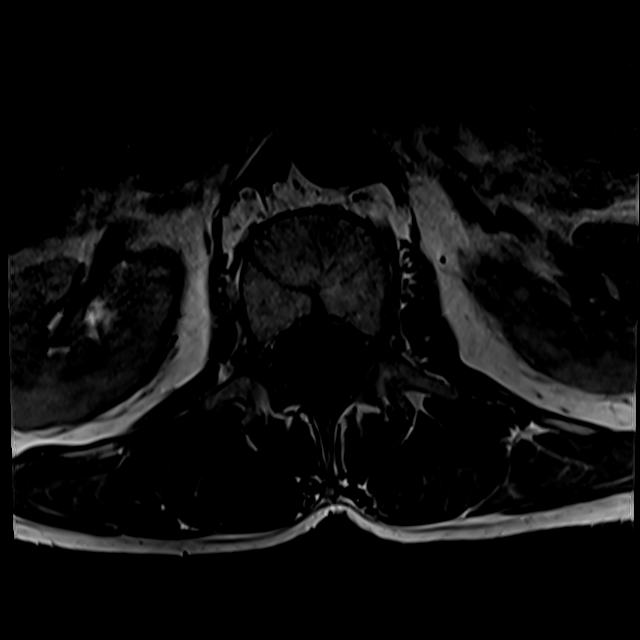

[18 of 48 positions shown; findings below may reference images not displayed]

FINDINGS: Segmentation: Normal segmentation. Lowest well-formed disc labeled
the L5-S1 level.

Alignment: Chronic bilateral pars defects at L5 with associated 6 mm
spondylolisthesis. Trace retrolisthesis of L2 on L3. Vertebral
bodies otherwise normally aligned with preservation of the normal
lumbar lordosis.

Vertebrae: Vertebral body heights are maintained without evidence
for acute or chronic fracture. Bone marrow signal intensity within
normal limits. No discrete or worrisome osseous lesions. Chronic
reactive endplate changes present about the L5-S1 interspace.

Conus medullaris and cauda equina: Conus extends to the L1-2 level.
Conus and cauda equina appear normal.

Paraspinal and other soft tissues: Paraspinous soft tissues within
normal limits. T2 hyperintense cyst noted within the inferior pole
of the left kidney. Visualized visceral structures within normal
limits.

Disc levels:

L1-2:  Mild annular disc bulge.  No canal or foraminal stenosis.

L2-3: Trace retrolisthesis. Diffuse disc bulge with disc
desiccation. Mild left far lateral reactive endplate changes. Mild
facet and ligament flavum hypertrophy. No canal stenosis. Mild left
L2 foraminal narrowing.

L3-4: Diffuse disc bulge with disc desiccation. Mild facet and
ligament flavum hypertrophy. No canal or lateral recess stenosis.
Mild to moderate bilateral L3 foraminal stenosis, right greater than
left.

L4-5: Minimal disc bulge. Moderate facet hypertrophy. No canal
stenosis. Mild to moderate bilateral L4 foraminal narrowing.

L5-S1: Chronic bilateral pars defects with 6 mm spondylolisthesis.
Associated broad disc uncovering. No canal or lateral recess
stenosis. Moderate bilateral L5 foraminal stenosis due to slippage.
IMPRESSION: 1. Chronic bilateral pars defects at L5 with associated 6 mm
spondylolisthesis. Moderate bilateral L5 foraminal stenosis due to
slippage.
2. Multifactorial degenerative changes at L3-4 and L4-5 with
resultant mild to moderate bilateral L3 and L4 foraminal stenosis.

## 2019-11-26 ENCOUNTER — Other Ambulatory Visit: Payer: Self-pay | Admitting: Family Medicine

## 2019-11-26 DIAGNOSIS — M792 Neuralgia and neuritis, unspecified: Secondary | ICD-10-CM

## 2019-11-26 DIAGNOSIS — M1990 Unspecified osteoarthritis, unspecified site: Secondary | ICD-10-CM

## 2019-11-27 ENCOUNTER — Ambulatory Visit (INDEPENDENT_AMBULATORY_CARE_PROVIDER_SITE_OTHER): Payer: Medicare Other | Admitting: Family Medicine

## 2019-11-27 DIAGNOSIS — M792 Neuralgia and neuritis, unspecified: Secondary | ICD-10-CM

## 2019-11-27 DIAGNOSIS — M1990 Unspecified osteoarthritis, unspecified site: Secondary | ICD-10-CM | POA: Diagnosis not present

## 2019-11-27 DIAGNOSIS — G2581 Restless legs syndrome: Secondary | ICD-10-CM

## 2019-11-27 DIAGNOSIS — M47816 Spondylosis without myelopathy or radiculopathy, lumbar region: Secondary | ICD-10-CM | POA: Diagnosis not present

## 2019-11-27 MED ORDER — HYDROCODONE-ACETAMINOPHEN 7.5-325 MG PO TABS: 1.0000 | ORAL_TABLET | Freq: Four times a day (QID) | ORAL | 0 refills | Status: DC | PRN

## 2019-11-27 NOTE — Telephone Encounter (Signed)
Please call patient and let know below message from Dr. Yong Channel? Is the patient setting up with him or with parker?

## 2019-11-27 NOTE — Telephone Encounter (Signed)
Pt has appt with Dr. Jerline Pain today at 4.

## 2019-11-27 NOTE — Telephone Encounter (Signed)
Rx Requet

## 2019-11-27 NOTE — Assessment & Plan Note (Signed)
Stable.  Continue gabapentin 305 5 mg at bedtime and Norco 7.5-325 as needed.

## 2019-11-27 NOTE — Assessment & Plan Note (Signed)
Stable.  Continue meloxicam 15 mg daily, gabapentin 300 to 500 g daily, and Norco 7.5-325 mg daily as needed.

## 2019-11-27 NOTE — Progress Notes (Signed)
   Jonathan Beasley is a 69 y.o. male who presents today for a virtual office visit.  Assessment/Plan:  Chronic Problems Addressed Today: Lumbar spondylosis Stable.  Continue current doses of meloxicam gabapentin, and Flexeril.  Will refill Norco.  Database reviewed with no red flags.  Arthritis Stable.  Continue meloxicam 15 mg daily, gabapentin 300 to 500 g daily, and Norco 7.5-325 mg daily as needed.  Restless legs Stable.  Continue gabapentin 305 5 mg at bedtime and Norco 7.5-325 as needed.     Subjective:  HPI:   # Restless Legs Syndrome  - On gabapentin 300-500mg  at bedtime - Uses norco 7.5-325 daily as needed  # Low back pain / arthritis  -On meloxicam 15 mg daily, gabapentin 300 to 500 mg nightly, and Norco 7.5-325 daily as needed. -Takes Flexeril 10 mg 3 times daily as needed as well       Objective/Observations  Physical Exam: Gen: NAD, resting comfortably Pulm: Normal work of breathing Neuro: Grossly normal, moves all extremities Psych: Normal affect and thought content  Virtual Visit via Video   I connected with Jonathan Beasley on 11/27/19 at  4:00 PM EST by a video enabled telemedicine application and verified that I am speaking with the correct person using two identifiers. The limitations of evaluation and management by telemedicine and the availability of in person appointments were discussed. The patient expressed understanding and agreed to proceed.   Patient location: Home Provider location: [place] participating in the virtual visit: Myself and Patient     Jonathan Beasley. Jerline Pain, MD 11/27/2019 2:36 PM

## 2019-11-27 NOTE — Telephone Encounter (Signed)
See note

## 2019-11-27 NOTE — Telephone Encounter (Signed)
Controlled substances always need a visit- there is also no guarantee of ongoingn refill after review by new physician

## 2019-11-27 NOTE — Assessment & Plan Note (Signed)
Stable.  Continue current doses of meloxicam gabapentin, and Flexeril.  Will refill Norco.  Database reviewed with no red flags.

## 2020-01-09 ENCOUNTER — Telehealth: Payer: Self-pay | Admitting: Family Medicine

## 2020-01-09 NOTE — Telephone Encounter (Signed)
Called pt to reschedule TOC appt due to Dr. Jerline Pain being out of office. Pt stated he has already seen Dr. Jerline Pain a few times and wasn't sure if he still needs a TOC appt. Pt needed a refill on medication. Isla Pence spoke with pt about medication. Pt asked if Dr. Jerline Pain would let him know if he still needs the [facility] appt or not. Pt does not have any current issues he needs to address. Please advise.

## 2020-01-10 ENCOUNTER — Telehealth: Payer: Self-pay

## 2020-01-10 DIAGNOSIS — M792 Neuralgia and neuritis, unspecified: Secondary | ICD-10-CM

## 2020-01-10 DIAGNOSIS — M1990 Unspecified osteoarthritis, unspecified site: Secondary | ICD-10-CM

## 2020-01-10 MED ORDER — HYDROCODONE-ACETAMINOPHEN 7.5-325 MG PO TABS: 1.0000 | ORAL_TABLET | Freq: Four times a day (QID) | ORAL | 0 refills | Status: DC | PRN

## 2020-01-10 NOTE — Telephone Encounter (Signed)
Rx Request 

## 2020-01-10 NOTE — Telephone Encounter (Signed)
Rx sent in.  Algis Greenhouse. Jerline Pain, MD 01/10/2020 3:56 PM

## 2020-01-10 NOTE — Telephone Encounter (Signed)
Patient notified

## 2020-01-10 NOTE — Telephone Encounter (Signed)
MEDICATION:HYDROcodone-acetaminophen ([place]) 7.5-325 MG tablet  PHARMACY: [id number] [place], [place] Phone:  [phone]  Fax:  [phone]       Comments:   **Let patient know to contact pharmacy at the end of the day to make sure medication is ready. **  ** Please notify patient to allow 48-72 hours to process**  **Encourage patient to contact the pharmacy for refills or they can request refills through [facility]**

## 2020-01-10 NOTE — Telephone Encounter (Signed)
Patient wanted to know if the [facility] appt 01/25/20 need to be canceled or r/s. Patient aware that Dr. Jerline Pain will be out of the office and was told someone would return his call regarding this appt

## 2020-01-10 NOTE — Addendum Note (Signed)
Addended by: Vivi Barrack on: 01/10/2020 03:56 PM   Modules accepted: Orders

## 2020-01-25 ENCOUNTER — Encounter: Payer: Medicare Other | Admitting: Family Medicine

## 2020-03-10 ENCOUNTER — Other Ambulatory Visit: Payer: Self-pay | Admitting: Family Medicine

## 2020-03-10 DIAGNOSIS — M1990 Unspecified osteoarthritis, unspecified site: Secondary | ICD-10-CM

## 2020-03-10 DIAGNOSIS — M792 Neuralgia and neuritis, unspecified: Secondary | ICD-10-CM

## 2020-03-10 MED ORDER — HYDROCODONE-ACETAMINOPHEN 7.5-325 MG PO TABS: 1.0000 | ORAL_TABLET | Freq: Four times a day (QID) | ORAL | 0 refills | Status: DC | PRN

## 2020-03-10 NOTE — Telephone Encounter (Signed)
Rx request 

## 2020-03-10 NOTE — Telephone Encounter (Signed)
Rx Request 

## 2020-03-24 ENCOUNTER — Telehealth: Payer: Self-pay | Admitting: Family Medicine

## 2020-03-24 NOTE — Telephone Encounter (Signed)
I left a message asking the patient to call and schedule Medicare AWV with Courtney ([facility]).  If patient calls back, please schedule Medicare Wellness Visit (initial) at next available opening.  VDM (Dee-Dee) 

## 2020-04-28 ENCOUNTER — Telehealth (INDEPENDENT_AMBULATORY_CARE_PROVIDER_SITE_OTHER): Payer: Medicare Other | Admitting: Family Medicine

## 2020-04-28 VITALS — Ht 71.0 in | Wt 175.0 lb

## 2020-04-28 DIAGNOSIS — M792 Neuralgia and neuritis, unspecified: Secondary | ICD-10-CM | POA: Diagnosis not present

## 2020-04-28 DIAGNOSIS — G2581 Restless legs syndrome: Secondary | ICD-10-CM | POA: Diagnosis not present

## 2020-04-28 DIAGNOSIS — M1990 Unspecified osteoarthritis, unspecified site: Secondary | ICD-10-CM

## 2020-04-28 DIAGNOSIS — M5416 Radiculopathy, lumbar region: Secondary | ICD-10-CM

## 2020-04-28 DIAGNOSIS — M47816 Spondylosis without myelopathy or radiculopathy, lumbar region: Secondary | ICD-10-CM | POA: Diagnosis not present

## 2020-04-28 DIAGNOSIS — G4700 Insomnia, unspecified: Secondary | ICD-10-CM | POA: Diagnosis not present

## 2020-04-28 MED ORDER — TRAZODONE HCL 50 MG PO TABS: 25.0000 mg | ORAL_TABLET | Freq: Every evening | ORAL | 3 refills | Status: DC | PRN

## 2020-04-28 MED ORDER — HYDROCODONE-ACETAMINOPHEN 7.5-325 MG PO TABS: 1.0000 | ORAL_TABLET | Freq: Four times a day (QID) | ORAL | 0 refills | Status: DC | PRN

## 2020-04-28 MED ORDER — GABAPENTIN 300 MG PO CAPS: 300.0000 mg | ORAL_CAPSULE | Freq: Every day | ORAL | 3 refills | Status: DC

## 2020-04-28 NOTE — Assessment & Plan Note (Signed)
Stable.  Continue Loxitane 50 mg daily, gabapentin 300-500mg  daily, and Norco 7.5-325 mg daily.

## 2020-04-28 NOTE — Assessment & Plan Note (Signed)
Stable.  Continue current dose of meloxicam, gabapentin, and Flexeril.

## 2020-04-28 NOTE — Progress Notes (Signed)
   Jonathan Beasley is a 69 y.o. male who presents today for a virtual office visit.  Assessment/Plan:  Chronic Problems Addressed Today: Insomnia Discussed treatment options.  Recommended avoidance of benzos and Ambien due to associated risk of increased risk of dementia.  Will start trazodone 50 mg daily.  Discussed potential side effects.  He will check in with me in a couple weeks via MyChart.  Lumbar spondylosis Stable.  Continue current dose of meloxicam, gabapentin, and Flexeril.  Arthritis Stable.  Continue Loxitane 50 mg daily, gabapentin 300-500mg  daily, and Norco 7.5-325 mg daily.  Restless legs Stable.  Continue gabapentin 300-500 mg daily and Norco 7.5-325 mg daily as needed.  Will follow up in a few months for CPE.    Subjective:  HPI:  Patient with worsening insomnia for the last several months.  Has never been on any medications for this.  He suffers from severe restless leg syndrome that is managed with gabapentin and hydrocodone.  He has a few friends who are physicians who recommended Ambien or Valium.  He is concerned about potential risk for sleep deprivation and dementia.       Objective/Observations  Physical Exam: Gen: NAD, resting comfortably Pulm: Normal work of breathing Neuro: Grossly normal, moves all extremities Psych: Normal affect and thought content  Virtual Visit via Video   I connected with Jonathan Beasley on 04/28/20 at  3:40 PM EDT by a video enabled telemedicine application and verified that I am speaking with the correct person using two identifiers. The limitations of evaluation and management by telemedicine and the availability of in person appointments were discussed. The patient expressed understanding and agreed to proceed.   Patient location: Home Provider location: [place] participating in the virtual visit: Myself and Patient     Algis Greenhouse. Jerline Pain, MD 04/28/2020 3:57 PM

## 2020-04-28 NOTE — Assessment & Plan Note (Signed)
Stable.  Continue gabapentin 300-500 mg daily and Norco 7.5-325 mg daily as needed.

## 2020-04-28 NOTE — Assessment & Plan Note (Signed)
Discussed treatment options.  Recommended avoidance of benzos and Ambien due to associated risk of increased risk of dementia.  Will start trazodone 50 mg daily.  Discussed potential side effects.  He will check in with me in a couple weeks via MyChart.

## 2020-05-20 ENCOUNTER — Other Ambulatory Visit: Payer: Self-pay | Admitting: Family Medicine

## 2020-06-05 ENCOUNTER — Telehealth (INDEPENDENT_AMBULATORY_CARE_PROVIDER_SITE_OTHER): Payer: Medicare Other | Admitting: Family Medicine

## 2020-06-05 DIAGNOSIS — G4700 Insomnia, unspecified: Secondary | ICD-10-CM

## 2020-06-05 DIAGNOSIS — M1990 Unspecified osteoarthritis, unspecified site: Secondary | ICD-10-CM

## 2020-06-05 DIAGNOSIS — M792 Neuralgia and neuritis, unspecified: Secondary | ICD-10-CM

## 2020-06-05 DIAGNOSIS — M47816 Spondylosis without myelopathy or radiculopathy, lumbar region: Secondary | ICD-10-CM | POA: Diagnosis not present

## 2020-06-05 MED ORDER — HYDROCODONE-ACETAMINOPHEN 7.5-325 MG PO TABS: 1.0000 | ORAL_TABLET | Freq: Four times a day (QID) | ORAL | 0 refills | Status: DC | PRN

## 2020-06-05 NOTE — Progress Notes (Signed)
   Jonathan Beasley is a 69 y.o. male who presents today for a virtual office visit.  Assessment/Plan:  Chronic Problems Addressed Today: Lumbar spondylosis Will refill NORCO. Continue meloxicam, gabapentin, and flexeril.   Insomnia Worsened due to increased stress. Will continue trazodone 50mg  daily.     Subjective:  HPI:  Patient with flare of low back pain over the last few weeks. Unfortunately his son has had an issue with thoracic outlet syndrome leading to him having to help physical move his son. Overall his pain has been managed, but he needs a refill on his norco today.         Objective/Observations  Physical Exam: Gen: NAD, resting comfortably Pulm: Normal work of breathing Neuro: Grossly normal, moves all extremities Psych: Normal affect and thought content  Virtual Visit via Video   I connected with Jonathan Beasley on 06/05/20 at  3:40 PM EDT by a video enabled telemedicine application and verified that I am speaking with the correct person using two identifiers. The limitations of evaluation and management by telemedicine and the availability of in person appointments were discussed. The patient expressed understanding and agreed to proceed.   Patient location: Home Provider location: [place] participating in the virtual visit: Myself and Patient     Algis Greenhouse. Jerline Pain, MD 06/05/2020 4:11 PM

## 2020-06-05 NOTE — Assessment & Plan Note (Addendum)
Will refill NORCO. Continue meloxicam, gabapentin, and flexeril.

## 2020-06-05 NOTE — Assessment & Plan Note (Signed)
Worsened due to increased stress. Will continue trazodone 50mg  daily.

## 2020-08-04 ENCOUNTER — Other Ambulatory Visit: Payer: Self-pay | Admitting: Family Medicine

## 2020-08-04 DIAGNOSIS — M792 Neuralgia and neuritis, unspecified: Secondary | ICD-10-CM

## 2020-08-04 DIAGNOSIS — M5416 Radiculopathy, lumbar region: Secondary | ICD-10-CM

## 2020-08-04 DIAGNOSIS — M1990 Unspecified osteoarthritis, unspecified site: Secondary | ICD-10-CM

## 2020-08-04 NOTE — Telephone Encounter (Signed)
MEDICATION: Hydrocodone 325 MG & Flexeril 10 MG  [facility]ACY[facility] [place]  Comments: Pt had physical scheduled today 08/04/2020 but his insurance will not cover the physical until after 10/5. Scheduling pt for October. Pt asked if medication can be refilled until his CPE.   **Let patient know to contact pharmacy at the end of the day to make sure medication is ready. **  ** Please notify patient to allow 48-72 hours to process**  **Encourage patient to contact the pharmacy for refills or they can request refills through [facility]**

## 2020-08-05 ENCOUNTER — Encounter: Payer: Medicare Other | Admitting: Family Medicine

## 2020-08-05 MED ORDER — HYDROCODONE-ACETAMINOPHEN 7.5-325 MG PO TABS: 1.0000 | ORAL_TABLET | Freq: Four times a day (QID) | ORAL | 0 refills | Status: DC | PRN

## 2020-08-05 MED ORDER — CYCLOBENZAPRINE HCL 10 MG PO TABS: 10.0000 mg | ORAL_TABLET | Freq: Three times a day (TID) | ORAL | 3 refills | Status: DC | PRN

## 2020-08-05 NOTE — Telephone Encounter (Signed)
LAST APPOINTMENT DATE: 11/27/2019   NEXT APPOINTMENT DATE: 08/26/2020   Rx Flexeril  LAST REFILL: 08/20/2019  QTY: 90 3ref  Rx  Hydrocodone Last refill 06/05/2020  QTY 60 Ref

## 2020-08-05 NOTE — Telephone Encounter (Signed)
Rx send to pharmacy  

## 2020-08-26 ENCOUNTER — Encounter: Payer: Self-pay | Admitting: Family Medicine

## 2020-08-26 ENCOUNTER — Other Ambulatory Visit: Payer: Self-pay

## 2020-08-26 ENCOUNTER — Ambulatory Visit (INDEPENDENT_AMBULATORY_CARE_PROVIDER_SITE_OTHER): Payer: Medicare Other | Admitting: Family Medicine

## 2020-08-26 VITALS — BP 169/77 | HR 65 | Temp 97.9°F | Ht 71.0 in | Wt 165.2 lb

## 2020-08-26 DIAGNOSIS — Z0001 Encounter for general adult medical examination with abnormal findings: Secondary | ICD-10-CM | POA: Diagnosis not present

## 2020-08-26 DIAGNOSIS — G4700 Insomnia, unspecified: Secondary | ICD-10-CM | POA: Diagnosis not present

## 2020-08-26 DIAGNOSIS — G2581 Restless legs syndrome: Secondary | ICD-10-CM

## 2020-08-26 DIAGNOSIS — E782 Mixed hyperlipidemia: Secondary | ICD-10-CM

## 2020-08-26 DIAGNOSIS — Z23 Encounter for immunization: Secondary | ICD-10-CM | POA: Diagnosis not present

## 2020-08-26 NOTE — Progress Notes (Signed)
Chief Complaint:  Jonathan Beasley is a 69 y.o. male who presents today for his annual comprehensive physical exam.    Assessment/Plan:  New/Acute Problems: Elevated BP Reading Previously well controlled. Continue lifestyle modifications.  Continue home monitoring goal 150/90 or lower  Chronic Problems Addressed Today: Insomnia Continue trazodone 50 mg daily.  HLD (hyperlipidemia) Check lipids.  Has not been able to tolerate statins.  Continue lifestyle modifications.  Restless legs Stable.  Continue gabapentin 300 to 500 mg daily and Norco 7.5-325 mg daily as needed.   Preventative Healthcare: Discussed Covid vaccine.  Patient is holding off for the time being due to having antibiotics from infection last year.  Will check CBC, CMET, TSH, and lipid panel.  Deferred PSA.  Due for colon cancer screening and he will get this done soon.  Deferred pneumonia vaccine.   Patient Counseling(The following topics were reviewed and/or handout was given):  -Nutrition: Stressed importance of moderation in sodium/caffeine intake, saturated fat and cholesterol, caloric balance, sufficient intake of fresh fruits, vegetables, and fiber.  -Stressed the importance of regular exercise.   -Substance Abuse: Discussed cessation/primary prevention of tobacco, alcohol, or other drug use; driving or other dangerous activities under the influence; availability of treatment for abuse.   -Injury prevention: Discussed safety belts, safety helmets, smoke detector, smoking near bedding or upholstery.   -Sexuality: Discussed sexually transmitted diseases, partner selection, use of condoms, avoidance of unintended pregnancy and contraceptive alternatives.   -Dental health: Discussed importance of regular tooth brushing, flossing, and dental visits.  -Health maintenance and immunizations reviewed. Please refer to Health maintenance section.  Return to care in 1 year for next preventative visit.     Subjective:    HPI:  He has no acute complaints today.   Lifestyle Diet: Balanced. Trying to get more fruits and vegetables.  Exercise: Tries to work out daily. Likes riding bikes and [employer].   Depression screen PHQ 2/9 08/26/2020  Decreased Interest 0  Down, Depressed, Hopeless 0  PHQ - 2 Score 0  Altered sleeping -  Tired, decreased energy -  Change in appetite -  Feeling bad or failure about yourself  -  Trouble concentrating -  Moving slowly or fidgety/restless -  Suicidal thoughts -  PHQ-9 Score -  Difficult doing work/chores -    Health Maintenance Due  Topic Date Due  . Hepatitis C Screening  Never done  . INFLUENZA VACCINE  06/15/2020     ROS: Per HPI, otherwise a complete review of systems was negative.   PMH:  The following were reviewed and entered/updated in epic: Past Medical History:  Diagnosis Date  . Achilles tendonitis 07/26/2013  . Allergy   . Arthritis   . Hyperlipidemia   . Loose body in knee 06/02/2010  . Mallet finger 08/03/2011  . Restless legs   . Tear of right hamstring 03/01/2017   Patient Active Problem List   Diagnosis Date Noted  . Insomnia 04/28/2020  . Myalgia due to statin 08/15/2018  . Lumbar spondylosis 08/29/2017  . Restless legs   . Arthritis   . HLD (hyperlipidemia) 05/13/2014   Past Surgical History:  Procedure Laterality Date  . KNEE ARTHROSCOPY Right 2013    Family History  Problem Relation Age of Onset  . Heart disease Father   . Heart disease Maternal Grandfather   . Heart disease Paternal Grandfather     Medications- reviewed and updated Current Outpatient Medications  Medication Sig Dispense Refill  . acetic acid 5 %  SOLN Apply 1 application topically daily as needed. With physical therapy for iontophoresis 30 mL 5  . Albuterol Sulfate (PROAIR RESPICLICK) 034 (90 Base) MCG/ACT AEPB Inhale 2 puffs into the lungs every 6 (six) hours as needed. 1 each 11  . aspirin 81 MG tablet Take 81 mg by mouth daily.    .  Coenzyme Q10-Levocarnitine (CO Q-10 PLUS PO) Take 10 mg by mouth daily.    . cyclobenzaprine (FLEXERIL) 10 MG tablet Take 1 tablet (10 mg total) by mouth 3 (three) times daily as needed. 90 tablet 3  . gabapentin (NEURONTIN) 100 MG capsule 1-2 at bed time as needed. 180 capsule 3  . gabapentin (NEURONTIN) 300 MG capsule Take 1 capsule (300 mg total) by mouth at bedtime. 90 capsule 3  . HYDROcodone-acetaminophen (NORCO) 7.5-325 MG tablet Take 1 tablet by mouth every 6 (six) hours as needed for moderate pain or severe pain. 60 tablet 0  . hydrocortisone 2.5 % cream APPLY A SMALL AMOUNT TO EYELIDS TWICE A DAY UP TO 5 DAYS AS NEEDED FOR FLARES    . meloxicam (MOBIC) 15 MG tablet TAKE ONE TABLET BY MOUTH AS NEEDED FOR PAIN 90 tablet 3  . Multiple Vitamin (MULTIVITAMIN) tablet Take 1 tablet by mouth daily.    . niacin 500 MG tablet Take 1 tablet (500 mg total) by mouth at bedtime. 90 tablet 3  . traZODone (DESYREL) 50 MG tablet TAKE 0.5-1 TABLETS (25-50 MG TOTAL) BY MOUTH AT BEDTIME AS NEEDED FOR SLEEP. 90 tablet 2  . vitamin C (ASCORBIC ACID) 500 MG tablet Take 500 mg by mouth daily.     No current facility-administered medications for this visit.    Allergies-reviewed and updated Allergies  Allergen Reactions  . Penicillins Other (See Comments)    Childhood- "deathly ill"    Social History   Socioeconomic History  . Marital status: Married    Spouse name: Not on file  . Number of children: Not on file  . Years of education: Not on file  . Highest education level: Not on file  Occupational History  . Occupation: MARKETING  Tobacco Use  . Smoking status: Never Smoker  . Smokeless tobacco: Never Used  Substance and Sexual Activity  . Alcohol use: No  . Drug use: No  . Sexual activity: Yes  Other Topics Concern  . Not on file  Social History Narrative   Married   Secretary/administrator   Works in Secretary/administrator      Social Determinants of Health   Financial Resource Strain:   .  Difficulty of Paying Living Expenses: Not on file  Food Insecurity:   . Worried About Charity fundraiser in the Last Year: Not on file  . Ran Out of Food in the Last Year: Not on file  Transportation Needs:   . Lack of Transportation (Medical): Not on file  . Lack of Transportation (Non-Medical): Not on file  Physical Activity:   . Days of Exercise per Week: Not on file  . Minutes of Exercise per Session: Not on file  Stress:   . Feeling of Stress : Not on file  Social Connections:   . Frequency of Communication with Friends and Family: Not on file  . Frequency of Social Gatherings with Friends and Family: Not on file  . Attends Religious Services: Not on file  . Active Member of Clubs or Organizations: Not on file  . Attends Archivist Meetings: Not on file  .  Marital Status: Not on file        Objective:  Physical Exam: BP (!) 169/77   Pulse 65   Temp 97.9 F (36.6 C) (Temporal)   Ht 5\' 11"  (1.803 m)   Wt 165 lb 3.2 oz (74.9 kg)   SpO2 98%   BMI 23.04 kg/m   Body mass index is 23.04 kg/m. Wt Readings from Last 3 Encounters:  08/26/20 165 lb 3.2 oz (74.9 kg)  04/28/20 175 lb (79.4 kg)  08/20/19 166 lb 3.2 oz (75.4 kg)   Gen: NAD, resting comfortably HEENT: TMs normal bilaterally. OP clear. No thyromegaly noted.  CV: RRR with no murmurs appreciated Pulm: NWOB, CTAB with no crackles, wheezes, or rhonchi GI: Normal bowel sounds present. Soft, Nontender, Nondistended. MSK: no edema, cyanosis, or clubbing noted Skin: warm, dry Neuro: CN2-12 grossly intact. Strength 5/5 in upper and lower extremities. Reflexes symmetric and intact bilaterally.  Psych: Normal affect and thought content     Taresa Montville M. Jerline Pain, MD 08/26/2020 10:16 AM

## 2020-08-26 NOTE — Assessment & Plan Note (Signed)
Check lipids.  Has not been able to tolerate statins.  Continue lifestyle modifications.

## 2020-08-26 NOTE — Assessment & Plan Note (Signed)
Stable.  Continue gabapentin 300 to 500 mg daily and Norco 7.5-325 mg daily as needed.

## 2020-08-26 NOTE — Assessment & Plan Note (Signed)
Continue trazodone 50 mg daily.

## 2020-08-26 NOTE — Patient Instructions (Addendum)
It was very nice to see you today!  Keep up the good work!  We will check blood work and give you a flu vaccine today.  Please keep an eye on your blood pressure and let me know if persistently 140/90 or higher.  I will see you back in a year for your next annual physical with blood work.  Take care, Dr Jerline Pain  Please try these tips to maintain a healthy lifestyle:   Eat at least 3 REAL meals and 1-2 snacks per day.  Aim for no more than 5 hours between eating.  If you eat breakfast, please do so within one hour of getting up.    Each meal should contain half fruits/vegetables, one quarter protein, and one quarter carbs (no bigger than a computer mouse)   Cut down on sweet beverages. This includes juice, soda, and sweet tea.     Drink at least 1 glass of water with each meal and aim for at least 8 glasses per day   Exercise at least 150 minutes every week.    Preventive Care 4 Years and Older, Male Preventive care refers to lifestyle choices and visits with your health care provider that can promote health and wellness. This includes:  A yearly physical exam. This is also called an annual well check.  Regular dental and eye exams.  Immunizations.  Screening for certain conditions.  Healthy lifestyle choices, such as diet and exercise. What can I expect for my preventive care visit? Physical exam Your health care provider will check:  Height and weight. These may be used to calculate body mass index (BMI), which is a measurement that tells if you are at a healthy weight.  Heart rate and blood pressure.  Your skin for abnormal spots. Counseling Your health care provider may ask you questions about:  Alcohol, tobacco, and drug use.  Emotional well-being.  Home and relationship well-being.  Sexual activity.  Eating habits.  History of falls.  Memory and ability to understand (cognition).  Work and work Statistician. What immunizations do I  need?  Influenza (flu) vaccine  This is recommended every year. Tetanus, diphtheria, and pertussis (Tdap) vaccine  You may need a Td booster every 10 years. Varicella (chickenpox) vaccine  You may need this vaccine if you have not already been vaccinated. Zoster (shingles) vaccine  You may need this after age 10. Pneumococcal conjugate (PCV13) vaccine  One dose is recommended after age 95. Pneumococcal polysaccharide (PPSV23) vaccine  One dose is recommended after age 19. Measles, mumps, and rubella (MMR) vaccine  You may need at least one dose of MMR if you were born in 1957 or later. You may also need a second dose. Meningococcal conjugate (MenACWY) vaccine  You may need this if you have certain conditions. Hepatitis A vaccine  You may need this if you have certain conditions or if you travel or work in places where you may be exposed to hepatitis A. Hepatitis B vaccine  You may need this if you have certain conditions or if you travel or work in places where you may be exposed to hepatitis B. Haemophilus influenzae type b (Hib) vaccine  You may need this if you have certain conditions. You may receive vaccines as individual doses or as more than one vaccine together in one shot (combination vaccines). Talk with your health care provider about the risks and benefits of combination vaccines. What tests do I need? Blood tests  Lipid and cholesterol levels. These may be  checked every 5 years, or more frequently depending on your overall health.  Hepatitis C test.  Hepatitis B test. Screening  Lung cancer screening. You may have this screening every year starting at age 50 if you have a 30-pack-year history of smoking and currently smoke or have quit within the past 15 years.  Colorectal cancer screening. All adults should have this screening starting at age 14 and continuing until age 80. Your health care provider may recommend screening at age 13 if you are at  increased risk. You will have tests every 1-10 years, depending on your results and the type of screening test.  Prostate cancer screening. Recommendations will vary depending on your family history and other risks.  Diabetes screening. This is done by checking your blood sugar (glucose) after you have not eaten for a while (fasting). You may have this done every 1-3 years.  Abdominal aortic aneurysm (AAA) screening. You may need this if you are a current or former smoker.  Sexually transmitted disease (STD) testing. Follow these instructions at home: Eating and drinking  Eat a diet that includes fresh fruits and vegetables, whole grains, lean protein, and low-fat dairy products. Limit your intake of foods with high amounts of sugar, saturated fats, and salt.  Take vitamin and mineral supplements as recommended by your health care provider.  Do not drink alcohol if your health care provider tells you not to drink.  If you drink alcohol: ? Limit how much you have to 0-2 drinks a day. ? Be aware of how much alcohol is in your drink. In the [place] one drink equals one 12 oz bottle of beer (355 mL), one 5 oz glass of wine (148 mL), or one 1 oz glass of hard liquor (44 mL). Lifestyle  Take daily care of your teeth and gums.  Stay active. Exercise for at least 30 minutes on 5 or more days each week.  Do not use any products that contain nicotine or tobacco, such as cigarettes, e-cigarettes, and chewing tobacco. If you need help quitting, ask your health care provider.  If you are sexually active, practice safe sex. Use a condom or other form of protection to prevent STIs (sexually transmitted infections).  Talk with your health care provider about taking a low-dose aspirin or statin. What's next?  Visit your health care provider once a year for a well check visit.  Ask your health care provider how often you should have your eyes and teeth checked.  Stay up to date on all  vaccines. This information is not intended to replace advice given to you by your health care provider. Make sure you discuss any questions you have with your health care provider. Document Revised: 10/26/2018 Document Reviewed: 10/26/2018 Elsevier Patient Education  2020 [employer].

## 2020-08-27 LAB — COMPREHENSIVE METABOLIC PANEL
AG Ratio: 1.2 (calc) (ref 1.0–2.5)
ALT: 25 U/L (ref 9–46)
AST: 36 U/L — ABNORMAL HIGH (ref 10–35)
Albumin: 4.3 g/dL (ref 3.6–5.1)
Alkaline phosphatase (APISO): 56 U/L (ref 35–144)
BUN: 21 mg/dL (ref 7–25)
CO2: 27 mmol/L (ref 20–32)
Calcium: 9.7 mg/dL (ref 8.6–10.3)
Chloride: 103 mmol/L (ref 98–110)
Creat: 0.97 mg/dL (ref 0.70–1.25)
Globulin: 3.5 g/dL (calc) (ref 1.9–3.7)
Glucose, Bld: 94 mg/dL (ref 65–99)
Potassium: 5.1 mmol/L (ref 3.5–5.3)
Sodium: 140 mmol/L (ref 135–146)
Total Bilirubin: 0.5 mg/dL (ref 0.2–1.2)
Total Protein: 7.8 g/dL (ref 6.1–8.1)

## 2020-08-27 LAB — CBC
HCT: 44.9 % (ref 38.5–50.0)
Hemoglobin: 14.7 g/dL (ref 13.2–17.1)
MCH: 30.2 pg (ref 27.0–33.0)
MCHC: 32.7 g/dL (ref 32.0–36.0)
MCV: 92.2 fL (ref 80.0–100.0)
MPV: 10.5 fL (ref 7.5–12.5)
Platelets: 240 10*3/uL (ref 140–400)
RBC: 4.87 10*6/uL (ref 4.20–5.80)
RDW: 12 % (ref 11.0–15.0)
WBC: 5.2 10*3/uL (ref 3.8–10.8)

## 2020-08-27 LAB — LIPID PANEL
Cholesterol: 182 mg/dL (ref <200)
HDL: 51 mg/dL (ref >=40)
LDL Cholesterol (Calc): 106 mg/dL (calc) — ABNORMAL HIGH
Non-HDL Cholesterol (Calc): 131 mg/dL (calc) — ABNORMAL HIGH (ref <130)
Total CHOL/HDL Ratio: 3.6 (calc) (ref <5.0)
Triglycerides: 136 mg/dL (ref <150)

## 2020-08-27 LAB — TSH: TSH: 1.79 mIU/L (ref 0.40–4.50)

## 2020-08-27 NOTE — Progress Notes (Signed)
Please inform patient of the following:  Good news! Labs are all stable. His "bad" cholesterol was a bit borderline but everything else is NORMAL. Overall cholesterol levels are better than last year. Would like for him to keep up the good work and we can recheck in a year.  Jonathan Beasley. Jerline Pain, MD 08/27/2020 9:51 AM

## 2020-08-28 ENCOUNTER — Telehealth: Payer: Self-pay

## 2020-08-28 NOTE — Telephone Encounter (Signed)
Returned pt call and labs reviewed.  

## 2020-08-28 NOTE — Telephone Encounter (Signed)
Patient returned call regarding lab results

## 2020-08-29 ENCOUNTER — Other Ambulatory Visit: Payer: Self-pay | Admitting: Family Medicine

## 2020-08-29 DIAGNOSIS — M5416 Radiculopathy, lumbar region: Secondary | ICD-10-CM

## 2020-09-09 ENCOUNTER — Other Ambulatory Visit: Payer: Self-pay | Admitting: *Deleted

## 2020-09-09 DIAGNOSIS — M5416 Radiculopathy, lumbar region: Secondary | ICD-10-CM

## 2020-09-09 MED ORDER — GABAPENTIN 100 MG PO CAPS: ORAL_CAPSULE | ORAL | 3 refills | Status: DC

## 2020-09-17 ENCOUNTER — Other Ambulatory Visit: Payer: Self-pay | Admitting: Family Medicine

## 2020-09-17 DIAGNOSIS — M792 Neuralgia and neuritis, unspecified: Secondary | ICD-10-CM

## 2020-09-17 DIAGNOSIS — M1990 Unspecified osteoarthritis, unspecified site: Secondary | ICD-10-CM

## 2020-09-17 NOTE — Telephone Encounter (Signed)
LAST APPOINTMENT DATE: 08/29/2020   NEXT APPOINTMENT DATE: Visit date not found    LAST REFILL: 08/05/2020  QTY: 32 REF0

## 2020-09-18 MED ORDER — HYDROCODONE-ACETAMINOPHEN 7.5-325 MG PO TABS: 1.0000 | ORAL_TABLET | Freq: Four times a day (QID) | ORAL | 0 refills | Status: DC | PRN

## 2020-10-12 ENCOUNTER — Other Ambulatory Visit: Payer: Self-pay | Admitting: Family Medicine

## 2020-10-12 DIAGNOSIS — M1990 Unspecified osteoarthritis, unspecified site: Secondary | ICD-10-CM

## 2020-10-22 ENCOUNTER — Other Ambulatory Visit: Payer: Self-pay | Admitting: Family Medicine

## 2020-10-22 ENCOUNTER — Encounter: Payer: Self-pay | Admitting: Family Medicine

## 2020-10-22 DIAGNOSIS — M792 Neuralgia and neuritis, unspecified: Secondary | ICD-10-CM

## 2020-10-22 DIAGNOSIS — M1990 Unspecified osteoarthritis, unspecified site: Secondary | ICD-10-CM

## 2020-10-22 MED ORDER — HYDROCODONE-ACETAMINOPHEN 7.5-325 MG PO TABS: 1.0000 | ORAL_TABLET | Freq: Four times a day (QID) | ORAL | 0 refills | Status: DC | PRN

## 2020-10-24 ENCOUNTER — Telehealth: Payer: Medicare Other | Admitting: Family Medicine

## 2020-10-28 ENCOUNTER — Telehealth: Payer: Self-pay | Admitting: Family Medicine

## 2020-10-28 NOTE — Telephone Encounter (Signed)
Left message for patient to call back and schedule Medicare Annual Wellness Visit (AWV) either virtually OR in office.   No hx; please schedule at anytime with [facility] Health Advisor at [facility].  This should be a 45 minute visit.   

## 2020-11-03 LAB — IFOBT (OCCULT BLOOD): IFOBT: NEGATIVE

## 2020-11-24 ENCOUNTER — Encounter: Payer: Self-pay | Admitting: Family Medicine

## 2020-11-24 ENCOUNTER — Other Ambulatory Visit: Payer: Self-pay | Admitting: Family Medicine

## 2020-11-24 DIAGNOSIS — M1990 Unspecified osteoarthritis, unspecified site: Secondary | ICD-10-CM

## 2020-11-24 DIAGNOSIS — M792 Neuralgia and neuritis, unspecified: Secondary | ICD-10-CM

## 2020-11-25 ENCOUNTER — Telehealth: Payer: Self-pay | Admitting: Orthopedic Surgery

## 2020-11-25 MED ORDER — HYDROCODONE-ACETAMINOPHEN 7.5-325 MG PO TABS: 1.0000 | ORAL_TABLET | Freq: Four times a day (QID) | ORAL | 0 refills | Status: DC | PRN

## 2020-11-25 NOTE — Telephone Encounter (Signed)
LAST APPOINTMENT DATE: 08/26/2020   NEXT APPOINTMENT DATE: Visit date not found    LAST REFILL:10/22/2020  QTY: 31

## 2020-11-25 NOTE — Telephone Encounter (Signed)
See note

## 2020-11-27 ENCOUNTER — Other Ambulatory Visit: Payer: Self-pay | Admitting: Family Medicine

## 2020-11-27 DIAGNOSIS — M5416 Radiculopathy, lumbar region: Secondary | ICD-10-CM

## 2020-12-25 ENCOUNTER — Encounter: Payer: Self-pay | Admitting: Family Medicine

## 2020-12-25 ENCOUNTER — Other Ambulatory Visit: Payer: Self-pay | Admitting: Family Medicine

## 2020-12-25 DIAGNOSIS — M1990 Unspecified osteoarthritis, unspecified site: Secondary | ICD-10-CM

## 2020-12-25 DIAGNOSIS — M792 Neuralgia and neuritis, unspecified: Secondary | ICD-10-CM

## 2020-12-25 MED ORDER — HYDROCODONE-ACETAMINOPHEN 7.5-325 MG PO TABS: 1.0000 | ORAL_TABLET | Freq: Four times a day (QID) | ORAL | 0 refills | Status: DC | PRN

## 2020-12-31 NOTE — Telephone Encounter (Signed)
See note

## 2021-01-28 ENCOUNTER — Other Ambulatory Visit: Payer: Self-pay | Admitting: Family Medicine

## 2021-01-28 ENCOUNTER — Encounter: Payer: Self-pay | Admitting: Family Medicine

## 2021-01-28 DIAGNOSIS — M792 Neuralgia and neuritis, unspecified: Secondary | ICD-10-CM

## 2021-01-28 DIAGNOSIS — M1990 Unspecified osteoarthritis, unspecified site: Secondary | ICD-10-CM

## 2021-01-28 MED ORDER — HYDROCODONE-ACETAMINOPHEN 7.5-325 MG PO TABS: 1.0000 | ORAL_TABLET | Freq: Four times a day (QID) | ORAL | 0 refills | Status: DC | PRN

## 2021-01-28 NOTE — Telephone Encounter (Signed)
Pt requesting refill for Hydrocodone. Last OV 08/2020.

## 2021-01-28 NOTE — Telephone Encounter (Signed)
FYI patient has virtual appointment tomorrow

## 2021-01-29 ENCOUNTER — Encounter: Payer: Self-pay | Admitting: Family Medicine

## 2021-01-29 ENCOUNTER — Telehealth (INDEPENDENT_AMBULATORY_CARE_PROVIDER_SITE_OTHER): Payer: Medicare Other | Admitting: Family Medicine

## 2021-01-29 VITALS — Ht 71.0 in | Wt 170.0 lb

## 2021-01-29 DIAGNOSIS — J45990 Exercise induced bronchospasm: Secondary | ICD-10-CM | POA: Diagnosis not present

## 2021-01-29 DIAGNOSIS — E782 Mixed hyperlipidemia: Secondary | ICD-10-CM

## 2021-01-29 DIAGNOSIS — G2581 Restless legs syndrome: Secondary | ICD-10-CM

## 2021-01-29 DIAGNOSIS — M1990 Unspecified osteoarthritis, unspecified site: Secondary | ICD-10-CM | POA: Diagnosis not present

## 2021-01-29 DIAGNOSIS — J683 Other acute and subacute respiratory conditions due to chemicals, gases, fumes and vapors: Secondary | ICD-10-CM | POA: Diagnosis not present

## 2021-01-29 MED ORDER — PROAIR RESPICLICK 108 (90 BASE) MCG/ACT IN AEPB: 2.0000 | INHALATION_SPRAY | Freq: Four times a day (QID) | RESPIRATORY_TRACT | 0 refills | Status: DC | PRN

## 2021-01-29 NOTE — Assessment & Plan Note (Signed)
Stable. Continue meloxicam 15mg  daily, gabapentin 300-500mg  daily, and norco 7.5-325mg  daily.

## 2021-01-29 NOTE — Assessment & Plan Note (Signed)
-   Albuterol refilled.

## 2021-01-29 NOTE — Assessment & Plan Note (Addendum)
Stable. Continue gabapentin 300-500mg  daily and norco 7.5-325mg  daily as needed.   May consider referral for sleep study in the future.

## 2021-01-29 NOTE — Progress Notes (Signed)
   Paulo Kennard Fildes is a 70 y.o. male who presents today for a virtual office visit.  Assessment/Plan:  Chronic Problems Addressed Today: Arthritis Stable. Continue meloxicam 15mg  daily, gabapentin 300-500mg  daily, and norco 7.5-325mg  daily.   Restless legs Stable. Continue gabapentin 300-500mg  daily and norco 7.5-325mg  daily as needed.   May consider referral for sleep study in the future.   HLD (hyperlipidemia) Patient has not been able to tolerate statins. Possibly interested in referral to lipid clinic in the future.   Exercise-induced asthma Albuterol refilled.      Subjective:  HPI:  See A/p for status of chronic conditions.  He has been under more stress lately due to health condition of his son.  Overall feels like he is managing it well but it has impacted some of his quality of life and activities of daily living.       Objective/Observations  Physical Exam: Gen: NAD, resting comfortably Pulm: Normal work of breathing Neuro: Grossly normal, moves all extremities Psych: Normal affect and thought content  Virtual Visit via Video   I connected with Yossef Gilkison on 01/29/21 at  3:40 PM EDT by a video enabled telemedicine application and verified that I am speaking with the correct person using two identifiers. The limitations of evaluation and management by telemedicine and the availability of in person appointments were discussed. The patient expressed understanding and agreed to proceed.   Patient location: Home Provider location: [place] participating in the virtual visit: Myself and Patient     Algis Greenhouse. Jerline Pain, MD 01/29/2021 4:11 PM

## 2021-01-29 NOTE — Assessment & Plan Note (Signed)
Patient has not been able to tolerate statins. Possibly interested in referral to lipid clinic in the future.

## 2021-02-17 ENCOUNTER — Other Ambulatory Visit: Payer: Self-pay | Admitting: Family Medicine

## 2021-02-26 ENCOUNTER — Encounter: Payer: Self-pay | Admitting: Family Medicine

## 2021-02-26 ENCOUNTER — Other Ambulatory Visit: Payer: Self-pay | Admitting: Family Medicine

## 2021-02-26 DIAGNOSIS — M792 Neuralgia and neuritis, unspecified: Secondary | ICD-10-CM

## 2021-02-26 DIAGNOSIS — M1990 Unspecified osteoarthritis, unspecified site: Secondary | ICD-10-CM

## 2021-02-26 MED ORDER — HYDROCODONE-ACETAMINOPHEN 7.5-325 MG PO TABS: 1.0000 | ORAL_TABLET | Freq: Four times a day (QID) | ORAL | 0 refills | Status: DC | PRN

## 2021-02-26 NOTE — Telephone Encounter (Signed)
Rx Request 

## 2021-03-06 ENCOUNTER — Telehealth (INDEPENDENT_AMBULATORY_CARE_PROVIDER_SITE_OTHER): Payer: Medicare Other | Admitting: Family Medicine

## 2021-03-06 ENCOUNTER — Encounter: Payer: Self-pay | Admitting: Family Medicine

## 2021-03-06 DIAGNOSIS — J329 Chronic sinusitis, unspecified: Secondary | ICD-10-CM | POA: Diagnosis not present

## 2021-03-06 DIAGNOSIS — J45990 Exercise induced bronchospasm: Secondary | ICD-10-CM

## 2021-03-06 DIAGNOSIS — G4700 Insomnia, unspecified: Secondary | ICD-10-CM | POA: Diagnosis not present

## 2021-03-06 MED ORDER — BENZONATATE 200 MG PO CAPS: 200.0000 mg | ORAL_CAPSULE | Freq: Two times a day (BID) | ORAL | 0 refills | Status: DC | PRN

## 2021-03-06 MED ORDER — ZITHROMAX Z-PAK 250 MG PO TABS: ORAL_TABLET | ORAL | 0 refills | Status: DC

## 2021-03-06 NOTE — Assessment & Plan Note (Signed)
Worsened due to recent illness.  Hopefully will improve as we treat his sinusitis.  We will continue trazodone 50 mg daily.

## 2021-03-06 NOTE — Progress Notes (Signed)
   Jonathan Beasley is a 70 y.o. male who presents today for a virtual office visit.  Assessment/Plan:  New/Acute Problems: Sinusitis No red flags.  Start azithromycin and Tessalon at this is worked well for him in the past.  Encourage good oral hydration.  Chronic Problems Addressed Today: Exercise-induced asthma Overall stable.  Does not feel like he needs prednisone at this time.  We will continue albuterol.  Insomnia Worsened due to recent illness.  Hopefully will improve as we treat his sinusitis.  We will continue trazodone 50 mg daily.     Subjective:  HPI:  Patient here with concerns for sinusitis.  Symptoms started about a month ago.  He has been under a lot of stress recently due to the help of the son.  The son was recently hospitalized with pulmonary embolism.  Patient has had ongoing symptoms including cough, congestion and difficulty sleeping for the past several weeks.  When he has had this in the past azithromycin is worked well.  No reported fevers or chills.       Objective/Observations  Physical Exam: Gen: NAD, resting comfortably Pulm: Normal work of breathing Neuro: Grossly normal, moves all extremities Psych: Normal affect and thought content  Virtual Visit via Video   I connected with Jonathan Beasley on 03/06/21 at 11:20 AM EDT by a video enabled telemedicine application and verified that I am speaking with the correct person using two identifiers. The limitations of evaluation and management by telemedicine and the availability of in person appointments were discussed. The patient expressed understanding and agreed to proceed.   Patient location: Home Provider location: [place] participating in the virtual visit: Myself and Patient      Algis Greenhouse. Jerline Pain, MD 03/06/2021 1:09 PM

## 2021-03-06 NOTE — Assessment & Plan Note (Signed)
Overall stable.  Does not feel like he needs prednisone at this time.  We will continue albuterol.

## 2021-03-24 ENCOUNTER — Other Ambulatory Visit: Payer: Self-pay | Admitting: Family Medicine

## 2021-03-24 DIAGNOSIS — M5416 Radiculopathy, lumbar region: Secondary | ICD-10-CM

## 2021-03-26 ENCOUNTER — Encounter: Payer: Self-pay | Admitting: Family Medicine

## 2021-03-26 ENCOUNTER — Other Ambulatory Visit: Payer: Self-pay | Admitting: Family Medicine

## 2021-03-26 DIAGNOSIS — M1990 Unspecified osteoarthritis, unspecified site: Secondary | ICD-10-CM

## 2021-03-26 DIAGNOSIS — M792 Neuralgia and neuritis, unspecified: Secondary | ICD-10-CM

## 2021-03-26 MED ORDER — HYDROCODONE-ACETAMINOPHEN 7.5-325 MG PO TABS: 1.0000 | ORAL_TABLET | Freq: Four times a day (QID) | ORAL | 0 refills | Status: DC | PRN

## 2021-03-26 NOTE — Telephone Encounter (Signed)
See note

## 2021-03-31 ENCOUNTER — Telehealth (INDEPENDENT_AMBULATORY_CARE_PROVIDER_SITE_OTHER): Payer: Medicare Other | Admitting: Family Medicine

## 2021-03-31 DIAGNOSIS — F439 Reaction to severe stress, unspecified: Secondary | ICD-10-CM | POA: Diagnosis not present

## 2021-03-31 NOTE — Progress Notes (Signed)
   Jonathan Beasley is a 70 y.o. male who presents today for a virtual office visit.  Assessment/Plan:  New/Acute Problems: Stress Had lengthy discussion with patient and his wife regarding their caregiver burden and stress related to the health care of her son.  Hopefully will improve as his son's medical condition completes work-up.  Advised him that he should contact a disability lawyer to discuss getting on disability which should hopefully help some with financial issues.     Subjective:  HPI:  Patient here to discuss mental health concerns.  He is with his wife.  His son has had progressively worsening symptoms.  Developed more issues with loss of motor control.  This is a common very stressful for both patient and his wife as they are his primary caregivers.  There is also been some financial burden associated with increasing cost of his medical care.  He will be following up with neurology in a few weeks.       Objective/Observations  Physical Exam: Gen: NAD, resting comfortably Pulm: Normal work of breathing Neuro: Grossly normal, moves all extremities Psych: Normal affect and thought content  Virtual Visit via Video   I connected with Jonathan Beasley on 03/31/21 at  2:40 PM EDT by a video enabled telemedicine application and verified that I am speaking with the correct person using two identifiers. The limitations of evaluation and management by telemedicine and the availability of in person appointments were discussed. The patient expressed understanding and agreed to proceed.   Patient location: Home Provider location: [place] Persons participating in the virtual visit: Myself and patient, and his wife  Time Spent: 27 minutes of total time was spent on the date of the encounter performing the following actions: chart review prior to seeing the patient, obtaining history, performing a medically necessary exam, counseling on the treatment plan,  placing orders, and documenting in our EHR.       Jonathan Beasley. Jerline Pain, MD 03/31/2021 12:56 PM

## 2021-04-03 ENCOUNTER — Telehealth: Payer: Medicare Other | Admitting: Family Medicine

## 2021-04-21 ENCOUNTER — Encounter: Payer: Self-pay | Admitting: Family Medicine

## 2021-04-21 ENCOUNTER — Other Ambulatory Visit: Payer: Self-pay | Admitting: Family Medicine

## 2021-04-21 DIAGNOSIS — M792 Neuralgia and neuritis, unspecified: Secondary | ICD-10-CM

## 2021-04-21 DIAGNOSIS — M1990 Unspecified osteoarthritis, unspecified site: Secondary | ICD-10-CM

## 2021-04-21 MED ORDER — HYDROCODONE-ACETAMINOPHEN 7.5-325 MG PO TABS: 1.0000 | ORAL_TABLET | Freq: Four times a day (QID) | ORAL | 0 refills | Status: DC | PRN

## 2021-05-19 ENCOUNTER — Telehealth: Payer: Self-pay

## 2021-05-19 NOTE — Chronic Care Management (AMB) (Signed)
  Chronic Care Management   Outreach Note  05/19/2021 Name: Jonathan Beasley MRN: [id number] DOB: 21-Dec-1950  Jonathan Beasley is a 70 y.o. year old male who is a primary care patient of Vivi Barrack, MD. I reached out to [facility] by phone today in response to a referral sent by [name] PCP, Vivi Barrack, MD      An unsuccessful telephone outreach was attempted today. The patient was referred to the case management team for assistance with care management and care coordination.   Follow Up Plan: A HIPAA compliant phone message was left for the patient providing contact information and requesting a return call.  The care management team will reach out to the patient again over the next 7 days.  If patient returns call to provider office, please advise to call [facility] at [place], [place], [place], [place] [address] Direct Dial: [phone] [email or web address]

## 2021-05-20 ENCOUNTER — Other Ambulatory Visit: Payer: Self-pay | Admitting: Family Medicine

## 2021-05-20 ENCOUNTER — Encounter: Payer: Self-pay | Admitting: Family Medicine

## 2021-05-20 DIAGNOSIS — M792 Neuralgia and neuritis, unspecified: Secondary | ICD-10-CM

## 2021-05-20 DIAGNOSIS — M1990 Unspecified osteoarthritis, unspecified site: Secondary | ICD-10-CM

## 2021-05-21 MED ORDER — HYDROCODONE-ACETAMINOPHEN 7.5-325 MG PO TABS: 1.0000 | ORAL_TABLET | Freq: Four times a day (QID) | ORAL | 0 refills | Status: DC | PRN

## 2021-05-25 NOTE — Chronic Care Management (AMB) (Signed)
  Chronic Care Management   Outreach Note  05/25/2021 Name: Jonathan Beasley MRN: [id number] DOB: [birth year]  Jonathan Beasley is a 70 y.o. year old male who is a primary care patient of Jonathan Barrack, MD. I reached out to [facility] by phone today in response to a referral sent by [name]'s PCP, Jonathan Barrack, MD      A second unsuccessful telephone outreach was attempted today. The patient was referred to the case management team for assistance with care management and care coordination.   Follow Up Plan: A HIPAA compliant phone message was left for the patient providing contact information and requesting a return call.  The care management team will reach out to the patient again over the next 7 days.  If patient returns call to provider office, please advise to call [facility] at [place], [place], [place], [place] [address] Direct Dial: [phone] [email or web address]: [email or web address]

## 2021-06-03 NOTE — Chronic Care Management (AMB) (Signed)
  Chronic Care Management   Outreach Note  06/03/2021 Name: Terren Jandreau MRN: [id number] DOB: Apr 12, 1951  Trey Sailors Dani is a 70 y.o. year old male who is a primary care patient of Vivi Barrack, MD. I reached out to [facility] by phone today in response to a referral sent by Mr. Napoleon Monacelli Diffley's PCP, Vivi Barrack, MD      Third unsuccessful telephone outreach was attempted today. The patient was referred to the case management team for assistance with care management and care coordination. The patient's primary care provider has been notified of our unsuccessful attempts to make or maintain contact with the patient. The care management team is pleased to engage with this patient at any time in the future should he/she be interested in assistance from the care management team.   Follow Up Plan: We have been unable to make contact with the patient. The care management team is available to follow up with the patient after provider conversation with the patient regarding recommendation for care management engagement and subsequent re-referral to the care management team.   Noreene Larsson, [place], [address], [place] [address] Direct Dial: [phone] [email or web address]

## 2021-06-12 ENCOUNTER — Other Ambulatory Visit: Payer: Self-pay | Admitting: Family Medicine

## 2021-06-12 DIAGNOSIS — M5416 Radiculopathy, lumbar region: Secondary | ICD-10-CM

## 2021-06-15 ENCOUNTER — Telehealth (INDEPENDENT_AMBULATORY_CARE_PROVIDER_SITE_OTHER): Payer: Medicare Other | Admitting: Family Medicine

## 2021-06-15 ENCOUNTER — Encounter: Payer: Self-pay | Admitting: Family Medicine

## 2021-06-15 VITALS — Ht 71.0 in | Wt 170.0 lb

## 2021-06-15 DIAGNOSIS — M1990 Unspecified osteoarthritis, unspecified site: Secondary | ICD-10-CM | POA: Diagnosis not present

## 2021-06-15 DIAGNOSIS — M792 Neuralgia and neuritis, unspecified: Secondary | ICD-10-CM

## 2021-06-15 DIAGNOSIS — E782 Mixed hyperlipidemia: Secondary | ICD-10-CM

## 2021-06-15 DIAGNOSIS — M47816 Spondylosis without myelopathy or radiculopathy, lumbar region: Secondary | ICD-10-CM | POA: Diagnosis not present

## 2021-06-15 DIAGNOSIS — G4700 Insomnia, unspecified: Secondary | ICD-10-CM

## 2021-06-15 DIAGNOSIS — G2581 Restless legs syndrome: Secondary | ICD-10-CM | POA: Diagnosis not present

## 2021-06-15 MED ORDER — HYDROCODONE-ACETAMINOPHEN 7.5-325 MG PO TABS: 1.0000 | ORAL_TABLET | Freq: Four times a day (QID) | ORAL | 0 refills | Status: DC | PRN

## 2021-06-15 NOTE — Assessment & Plan Note (Signed)
On gabapentin 300 to 500 mg daily and Norco 7.5-325 mg daily as needed.  Database without red flags.

## 2021-06-15 NOTE — Assessment & Plan Note (Signed)
On Norco, gabapentin, and Flexeril.  He has meloxicam to use as needed but however his diabetes for several months.  Database without red flags.  Refilled Norco today.

## 2021-06-15 NOTE — Progress Notes (Signed)
   Jonathan Beasley is a 70 y.o. male who presents today for a virtual office visit.  Assessment/Plan:  Chronic Problems Addressed Today: Lumbar spondylosis On Norco, gabapentin, and Flexeril.  He has meloxicam to use as needed but however his diabetes for several months.  Database without red flags.  Refilled Norco today.  Restless legs On gabapentin 300 to 500 mg daily and Norco 7.5-325 mg daily as needed.  Database without red flags.  HLD (hyperlipidemia) We can check lipids at next office visit.     Subjective:  HPI:  See A/P for status of chronic conditions  He also recently received a questionnaire from his insurance.  He would like to exercise more but is not able to due to the help of his son.  He has not had any urinary issues.  No falls.  He has not seen any specialist recently.  Has been compliant with his medications which worked well.       Objective/Observations  Physical Exam: Gen: NAD, resting comfortably Pulm: Normal work of breathing Neuro: Grossly normal, moves all extremities Psych: Normal affect and thought content  Virtual Visit via Video   I connected with Jonathan Beasley on 06/15/21 at  4:00 PM EDT by a video enabled telemedicine application and verified that I am speaking with the correct person using two identifiers. The limitations of evaluation and management by telemedicine and the availability of in person appointments were discussed. The patient expressed understanding and agreed to proceed.   Patient location: Home Provider location: [place] participating in the virtual visit: Myself and Patient     Algis Greenhouse. Jerline Pain, MD 06/15/2021 3:49 PM

## 2021-06-15 NOTE — Assessment & Plan Note (Signed)
We can check lipids at next office visit.

## 2021-07-05 ENCOUNTER — Other Ambulatory Visit: Payer: Self-pay | Admitting: Family Medicine

## 2021-07-05 DIAGNOSIS — J683 Other acute and subacute respiratory conditions due to chemicals, gases, fumes and vapors: Secondary | ICD-10-CM

## 2021-07-13 ENCOUNTER — Other Ambulatory Visit: Payer: Self-pay | Admitting: Family Medicine

## 2021-07-13 ENCOUNTER — Encounter: Payer: Self-pay | Admitting: Family Medicine

## 2021-07-13 DIAGNOSIS — M1990 Unspecified osteoarthritis, unspecified site: Secondary | ICD-10-CM

## 2021-07-13 DIAGNOSIS — M792 Neuralgia and neuritis, unspecified: Secondary | ICD-10-CM

## 2021-07-14 MED ORDER — HYDROCODONE-ACETAMINOPHEN 7.5-325 MG PO TABS: 1.0000 | ORAL_TABLET | Freq: Four times a day (QID) | ORAL | 0 refills | Status: DC | PRN

## 2021-07-28 ENCOUNTER — Telehealth (INDEPENDENT_AMBULATORY_CARE_PROVIDER_SITE_OTHER): Payer: Medicare Other | Admitting: Physician Assistant

## 2021-07-28 ENCOUNTER — Encounter: Payer: Self-pay | Admitting: Physician Assistant

## 2021-07-28 ENCOUNTER — Telehealth: Payer: Medicare Other | Admitting: Family Medicine

## 2021-07-28 VITALS — Ht 71.0 in

## 2021-07-28 DIAGNOSIS — R059 Cough, unspecified: Secondary | ICD-10-CM

## 2021-07-28 DIAGNOSIS — J0100 Acute maxillary sinusitis, unspecified: Secondary | ICD-10-CM | POA: Diagnosis not present

## 2021-07-28 MED ORDER — AZITHROMYCIN 250 MG PO TABS: ORAL_TABLET | ORAL | 0 refills | Status: DC

## 2021-07-28 MED ORDER — PREDNISONE 20 MG PO TABS: 20.0000 mg | ORAL_TABLET | Freq: Two times a day (BID) | ORAL | 0 refills | Status: AC

## 2021-07-28 NOTE — Progress Notes (Signed)
Virtual Visit via Video Note  I connected with  Jonathan Beasley  on 07/28/21 at  9:00 AM EDT by a video enabled telemedicine application and verified that I am speaking with the correct person using two identifiers.  Location: Patient: home Provider: Therapist, music at [place] present: Patient and myself   I discussed the limitations of evaluation and management by telemedicine and the availability of in person appointments. The patient expressed understanding and agreed to proceed.   History of Present Illness: Chief complaint: Cough, facial pain and pressure, congestion Symptom onset: 2 weeks ago Pertinent positives: Sinusitis symptoms Pertinent negatives: Fever, body aches, ST, N/V Treatments tried: OTC treatments, Nyquil Sick exposure: None directly. He has been training for senior games.    Observations/Objective:  Gen: Awake, alert, no acute distress; Hoarse & congested sounding Resp: Breathing is even and non-labored Psych: calm/pleasant demeanor Neuro: Alert and Oriented x 3, + facial symmetry, speech is clear.   Assessment and Plan: 1. Acute maxillary sinusitis, recurrence not specified 2. Cough Persistent symptoms >10 days despite conservative efforts at home. Will Rx Z-pak at this time as he has done well with this before. Prednisone for added relief. Cautioned on antibiotic use and possible side effects. Advised nasal saline, humidifier, and pushing fluids. Call if worse or no improvement.    Follow Up Instructions:    I discussed the assessment and treatment plan with the patient. The patient was provided an opportunity to ask questions and all were answered. The patient agreed with the plan and demonstrated an understanding of the instructions.   The patient was advised to call back or seek an in-person evaluation if the symptoms worsen or if the condition fails to improve as anticipated.  Lilyana Lippman M Daryan Buell, PA-C

## 2021-08-19 ENCOUNTER — Other Ambulatory Visit: Payer: Self-pay | Admitting: Family Medicine

## 2021-08-19 DIAGNOSIS — M1990 Unspecified osteoarthritis, unspecified site: Secondary | ICD-10-CM

## 2021-08-19 DIAGNOSIS — M792 Neuralgia and neuritis, unspecified: Secondary | ICD-10-CM

## 2021-08-20 MED ORDER — HYDROCODONE-ACETAMINOPHEN 7.5-325 MG PO TABS: 1.0000 | ORAL_TABLET | Freq: Four times a day (QID) | ORAL | 0 refills | Status: DC | PRN

## 2021-08-31 ENCOUNTER — Other Ambulatory Visit: Payer: Self-pay

## 2021-08-31 ENCOUNTER — Ambulatory Visit (INDEPENDENT_AMBULATORY_CARE_PROVIDER_SITE_OTHER): Payer: Medicare Other | Admitting: Family Medicine

## 2021-08-31 VITALS — BP 159/75 | HR 62 | Temp 97.7°F | Ht 71.0 in | Wt 168.2 lb

## 2021-08-31 DIAGNOSIS — T466X5A Adverse effect of antihyperlipidemic and antiarteriosclerotic drugs, initial encounter: Secondary | ICD-10-CM

## 2021-08-31 DIAGNOSIS — M791 Myalgia, unspecified site: Secondary | ICD-10-CM | POA: Diagnosis not present

## 2021-08-31 DIAGNOSIS — E782 Mixed hyperlipidemia: Secondary | ICD-10-CM | POA: Diagnosis not present

## 2021-08-31 DIAGNOSIS — Z125 Encounter for screening for malignant neoplasm of prostate: Secondary | ICD-10-CM

## 2021-08-31 DIAGNOSIS — M1990 Unspecified osteoarthritis, unspecified site: Secondary | ICD-10-CM

## 2021-08-31 DIAGNOSIS — R030 Elevated blood-pressure reading, without diagnosis of hypertension: Secondary | ICD-10-CM | POA: Diagnosis not present

## 2021-08-31 DIAGNOSIS — Z23 Encounter for immunization: Secondary | ICD-10-CM | POA: Diagnosis not present

## 2021-08-31 DIAGNOSIS — M47816 Spondylosis without myelopathy or radiculopathy, lumbar region: Secondary | ICD-10-CM | POA: Diagnosis not present

## 2021-08-31 DIAGNOSIS — G4700 Insomnia, unspecified: Secondary | ICD-10-CM

## 2021-08-31 DIAGNOSIS — Z0001 Encounter for general adult medical examination with abnormal findings: Secondary | ICD-10-CM | POA: Diagnosis not present

## 2021-08-31 DIAGNOSIS — G2581 Restless legs syndrome: Secondary | ICD-10-CM | POA: Diagnosis not present

## 2021-08-31 DIAGNOSIS — R739 Hyperglycemia, unspecified: Secondary | ICD-10-CM

## 2021-08-31 NOTE — Assessment & Plan Note (Signed)
With Norco, gabapentin, and Flexeril.  Database with no red flags.  Medications help with ability to stay active and perform ADLs and assist with the care of his son.  Continue current regimen.

## 2021-08-31 NOTE — Progress Notes (Signed)
Chief Complaint:  Jonathan Beasley is a 70 y.o. male who presents today for his annual comprehensive physical exam.    Assessment/Plan:  New/Acute Problems: Elevated BP Reading Well-controlled.  Continue home monitoring.  Continue lifestyle modification.  He will let us know if persistently 150/90 or higher.  Chronic Problems Addressed Today: HLD (hyperlipidemia) Check lipids with blood draw.   Insomnia Stable on trazodone 50mg  daily.   Lumbar spondylosis With Norco, gabapentin, and Flexeril.  Database with no red flags.  Medications help with ability to stay active and perform ADLs and assist with the care of his son.  Continue current regimen.  Restless legs On gabapentin 300 to 500 mg daily. Tolerating well.   Preventative Healthcare: Check Labs. Flu shot given. Colon cancer screening deferred for today.   Patient Counseling(The following topics were reviewed and/or handout was given):  -Nutrition: Stressed importance of moderation in sodium/caffeine intake, saturated fat and cholesterol, caloric balance, sufficient intake of fresh fruits, vegetables, and fiber.  -Stressed the importance of regular exercise.   -Substance Abuse: Discussed cessation/primary prevention of tobacco, alcohol, or other drug use; driving or other dangerous activities under the influence; availability of treatment for abuse.   -Injury prevention: Discussed safety belts, safety helmets, smoke detector, smoking near bedding or upholstery.   -Sexuality: Discussed sexually transmitted diseases, partner selection, use of condoms, avoidance of unintended pregnancy and contraceptive alternatives.   -Dental health: Discussed importance of regular tooth brushing, flossing, and dental visits.  -Health maintenance and immunizations reviewed. Please refer to Health maintenance section.  Return to care in 1 year for next preventative visit.     Subjective:  HPI:  He has no acute complaints today.   See  a/p for status of chronic conditions.   Lifestyle Diet: Reasonably healthy diet, but could incorporate more fruit.  Exercise: Does exercises like weight lifting every day.   Depression screen PHQ 2/9 08/31/2021  Decreased Interest 0  Down, Depressed, Hopeless -  PHQ - 2 Score 0  Altered sleeping -  Tired, decreased energy -  Change in appetite -  Feeling bad or failure about yourself  -  Trouble concentrating -  Moving slowly or fidgety/restless -  Suicidal thoughts -  PHQ-9 Score -  Difficult doing work/chores -    Health Maintenance Due  Topic Date Due   Hepatitis C Screening  Never done   COLONOSCOPY (Pts 45-78yrs Insurance coverage will need to be confirmed)  Never done   Zoster Vaccines- Shingrix (1 of 2) Never done   TETANUS/TDAP  06/17/2013   INFLUENZA VACCINE  06/15/2021     ROS: Per HPI, otherwise a complete review of systems was negative.   PMH:  The following were reviewed and entered/updated in epic: Past Medical History:  Diagnosis Date   Achilles tendonitis 07/26/2013   Allergy    Arthritis    Hyperlipidemia    Loose body in knee 06/02/2010   Mallet finger 08/03/2011   Restless legs    Tear of right hamstring 03/01/2017   Patient Active Problem List   Diagnosis Date Noted   Exercise-induced asthma 01/29/2021   Insomnia 04/28/2020   Myalgia due to statin 08/15/2018   Lumbar spondylosis 08/29/2017   Restless legs    Arthritis    HLD (hyperlipidemia) 05/13/2014   Past Surgical History:  Procedure Laterality Date   KNEE ARTHROSCOPY Right 2013    Family History  Problem Relation Age of Onset   Heart disease Father    Heart disease  Maternal Grandfather    Heart disease Paternal Grandfather     Medications- reviewed and updated Current Outpatient Medications  Medication Sig Dispense Refill   aspirin 81 MG tablet Take 81 mg by mouth daily.     Coenzyme Q10-Levocarnitine (CO Q-10 PLUS PO) Take 10 mg by mouth daily.     cyclobenzaprine  (FLEXERIL) 10 MG tablet TAKE 1 TABLET BY MOUTH THREE TIMES A DAY AS NEEDED 90 tablet 3   gabapentin (NEURONTIN) 100 MG capsule 1-2 at bed time as needed. 180 capsule 3   gabapentin (NEURONTIN) 300 MG capsule TAKE 1 CAPSULE BY MOUTH EVERYDAY AT BEDTIME 90 capsule 3   HYDROcodone-acetaminophen (NORCO) 7.5-325 MG tablet Take 1 tablet by mouth every 6 (six) hours as needed for moderate pain or severe pain. 60 tablet 0   hydrocortisone 2.5 % cream APPLY A SMALL AMOUNT TO EYELIDS TWICE A DAY UP TO 5 DAYS AS NEEDED FOR FLARES     meloxicam (MOBIC) 15 MG tablet TAKE ONE TABLET BY MOUTH AS NEEDED FOR PAIN 90 tablet 3   Multiple Vitamin (MULTIVITAMIN) tablet Take 1 tablet by mouth daily.     niacin 500 MG tablet Take 1 tablet (500 mg total) by mouth at bedtime. 90 tablet 3   PROAIR RESPICLICK 845 (90 Base) MCG/ACT AEPB TAKE 2 PUFFS BY MOUTH EVERY 6 HOURS AS NEEDED 1 each 0   traZODone (DESYREL) 50 MG tablet TAKE 0.5-1 TABLETS (25-50 MG TOTAL) BY MOUTH AT BEDTIME AS NEEDED FOR SLEEP. 90 tablet 2   vitamin C (ASCORBIC ACID) 500 MG tablet Take 500 mg by mouth daily.     No current facility-administered medications for this visit.    Allergies-reviewed and updated Allergies  Allergen Reactions   Penicillins Other (See Comments)    Childhood- "deathly ill"    Social History   Socioeconomic History   Marital status: Married    Spouse name: Not on file   Number of children: Not on file   Years of education: Not on file   Highest education level: Not on file  Occupational History   Occupation: MARKETING  Tobacco Use   Smoking status: Never   Smokeless tobacco: Never  Substance and Sexual Activity   Alcohol use: No   Drug use: No   Sexual activity: Yes  Other Topics Concern   Not on file  Social History Narrative   Married   Secretary/administrator   Works in Secretary/administrator      Social Determinants of Radio broadcast assistant Strain: Not on file  Food Insecurity: Not on file   Transportation Needs: Not on file  Physical Activity: Not on file  Stress: Not on file  Social Connections: Not on file        Objective:  Physical Exam: BP (!) 159/75   Pulse 62   Temp 97.7 F (36.5 C)   Ht 5\' 11"  (1.803 m)   Wt 168 lb 4 oz (76.3 kg)   SpO2 98%   BMI 23.47 kg/m   Body mass index is 23.47 kg/m. Wt Readings from Last 3 Encounters:  08/31/21 168 lb 4 oz (76.3 kg)  06/15/21 170 lb (77.1 kg)  01/29/21 170 lb (77.1 kg)   Gen: NAD, resting comfortably HEENT: TMs normal bilaterally. OP clear. No thyromegaly noted.  CV: RRR with no murmurs appreciated Pulm: NWOB, CTAB with no crackles, wheezes, or rhonchi GI: Normal bowel sounds present. Soft, Nontender, Nondistended. MSK: no edema, cyanosis, or clubbing noted Skin:  warm, dry Neuro: CN2-12 grossly intact. Strength 5/5 in upper and lower extremities. Reflexes symmetric and intact bilaterally.  Psych: Normal affect and thought content     I,Jordan Kelly,acting as a scribe for Dimas Chyle, MD.,have documented all relevant documentation on the behalf of Dimas Chyle, MD,as directed by  Dimas Chyle, MD while in the presence of Dimas Chyle, MD.  I, Dimas Chyle, MD, have reviewed all documentation for this visit. The documentation on 08/31/21 for the exam, diagnosis, procedures, and orders are all accurate and complete.  Algis Greenhouse. Jerline Pain, MD 08/31/2021 8:41 AM

## 2021-08-31 NOTE — Assessment & Plan Note (Signed)
Check lipids with blood draw.

## 2021-08-31 NOTE — Assessment & Plan Note (Signed)
Stable on trazodone 50mg  daily.

## 2021-08-31 NOTE — Patient Instructions (Signed)
It was very nice to see you today!  Please come back to get your blood work.   No medication changes today.  Keep a eye on your blood pressure and let me know if it is persistently 150/90 or higher.  I will see you back in a year for your next physical. Come back to see me sooner if needed.   Take care, Dr Jerline Pain  PLEASE NOTE:  If you had any lab tests please let us know if you have not heard back within a few days. You may see your results on mychart before we have a chance to review them but we will give you a call once they are reviewed by [place]. If we ordered any referrals today, please let us know if you have not heard from their office within the next week.   Please try these tips to maintain a healthy lifestyle:  Eat at least 3 REAL meals and 1-2 snacks per day.  Aim for no more than 5 hours between eating.  If you eat breakfast, please do so within one hour of getting up.   Each meal should contain half fruits/vegetables, one quarter protein, and one quarter carbs (no bigger than a computer mouse)  Cut down on sweet beverages. This includes juice, soda, and sweet tea.   Drink at least 1 glass of water with each meal and aim for at least 8 glasses per day  Exercise at least 150 minutes every week.    Preventive Care 69 Years and Older, Male Preventive care refers to lifestyle choices and visits with your health care provider that can promote health and wellness. This includes: A yearly physical exam. This is also called an annual wellness visit. Regular dental and eye exams. Immunizations. Screening for certain conditions. Healthy lifestyle choices, such as: Eating a healthy diet. Getting regular exercise. Not using drugs or products that contain nicotine and tobacco. Limiting alcohol use. What can I expect for my preventive care visit? Physical exam Your health care provider will check your: Height and weight. These may be used to calculate your BMI (body mass index).  BMI is a measurement that tells if you are at a healthy weight. Heart rate and blood pressure. Body temperature. Skin for abnormal spots. Counseling Your health care provider may ask you questions about your: Past medical problems. Family's medical history. Alcohol, tobacco, and drug use. Emotional well-being. Home life and relationship well-being. Sexual activity. Diet, exercise, and sleep habits. History of falls. Memory and ability to understand (cognition). Work and work Statistician. Access to firearms. What immunizations do I need? Vaccines are usually given at various ages, according to a schedule. Your health care provider will recommend vaccines for you based on your age, medical history, and lifestyle or other factors, such as travel or where you work. What tests do I need? Blood tests Lipid and cholesterol levels. These may be checked every 5 years, or more often depending on your overall health. Hepatitis C test. Hepatitis B test. Screening Lung cancer screening. You may have this screening every year starting at age 76 if you have a 30-pack-year history of smoking and currently smoke or have quit within the past 15 years. Colorectal cancer screening. All adults should have this screening starting at age 29 and continuing until age 70. Your health care provider may recommend screening at age 65 if you are at increased risk. You will have tests every 1-10 years, depending on your results and the type of screening test.  Prostate cancer screening. Recommendations will vary depending on your family history and other risks. Genital exam to check for testicular cancer or hernias. Diabetes screening. This is done by checking your blood sugar (glucose) after you have not eaten for a while (fasting). You may have this done every 1-3 years. Abdominal aortic aneurysm (AAA) screening. You may need this if you are a current or former smoker. STD (sexually transmitted disease)  testing, if you are at risk. Follow these instructions at home: Eating and drinking  Eat a diet that includes fresh fruits and vegetables, whole grains, lean protein, and low-fat dairy products. Limit your intake of foods with high amounts of sugar, saturated fats, and salt. Take vitamin and mineral supplements as recommended by your health care provider. Do not drink alcohol if your health care provider tells you not to drink. If you drink alcohol: Limit how much you have to 0-2 drinks a day. Be aware of how much alcohol is in your drink. In the [place] one drink equals one 12 oz bottle of beer (355 mL), one 5 oz glass of wine (148 mL), or one 1 oz glass of hard liquor (44 mL). Lifestyle Take daily care of your teeth and gums. Brush your teeth every morning and night with fluoride toothpaste. Floss one time each day. Stay active. Exercise for at least 30 minutes 5 or more days each week. Do not use any products that contain nicotine or tobacco, such as cigarettes, e-cigarettes, and chewing tobacco. If you need help quitting, ask your health care provider. Do not use drugs. If you are sexually active, practice safe sex. Use a condom or other form of protection to prevent STIs (sexually transmitted infections). Talk with your health care provider about taking a low-dose aspirin or statin. Find healthy ways to cope with stress, such as: Meditation, yoga, or listening to music. Journaling. Talking to a trusted person. Spending time with friends and family. Safety Always wear your seat belt while driving or riding in a vehicle. Do not drive: If you have been drinking alcohol. Do not ride with someone who has been drinking. When you are tired or distracted. While texting. Wear a helmet and other protective equipment during sports activities. If you have firearms in your house, make sure you follow all gun safety procedures. What's next? Visit your health care provider once a year for an  annual wellness visit. Ask your health care provider how often you should have your eyes and teeth checked. Stay up to date on all vaccines. This information is not intended to replace advice given to you by your health care provider. Make sure you discuss any questions you have with your health care provider. Document Revised: 01/09/2021 Document Reviewed: 10/26/2018 Elsevier Patient Education  2022 [employer].

## 2021-08-31 NOTE — Assessment & Plan Note (Signed)
On gabapentin 300 to 500 mg daily. Tolerating well.

## 2021-09-01 ENCOUNTER — Other Ambulatory Visit: Payer: Self-pay | Admitting: Family Medicine

## 2021-09-01 DIAGNOSIS — M5416 Radiculopathy, lumbar region: Secondary | ICD-10-CM

## 2021-09-10 ENCOUNTER — Other Ambulatory Visit: Payer: Self-pay

## 2021-09-10 ENCOUNTER — Other Ambulatory Visit (INDEPENDENT_AMBULATORY_CARE_PROVIDER_SITE_OTHER): Payer: Medicare Other

## 2021-09-10 DIAGNOSIS — Z0001 Encounter for general adult medical examination with abnormal findings: Secondary | ICD-10-CM

## 2021-09-10 DIAGNOSIS — Z125 Encounter for screening for malignant neoplasm of prostate: Secondary | ICD-10-CM

## 2021-09-10 DIAGNOSIS — E782 Mixed hyperlipidemia: Secondary | ICD-10-CM | POA: Diagnosis not present

## 2021-09-10 DIAGNOSIS — R739 Hyperglycemia, unspecified: Secondary | ICD-10-CM | POA: Diagnosis not present

## 2021-09-10 LAB — CBC
HCT: 44.8 % (ref 39.0–52.0)
Hemoglobin: 15 g/dL (ref 13.0–17.0)
MCHC: 33.4 g/dL (ref 30.0–36.0)
MCV: 90.5 fl (ref 78.0–100.0)
Platelets: 264 10*3/uL (ref 150.0–400.0)
RBC: 4.95 Mil/uL (ref 4.22–5.81)
RDW: 13.2 % (ref 11.5–15.5)
WBC: 5.6 10*3/uL (ref 4.0–10.5)

## 2021-09-10 LAB — COMPREHENSIVE METABOLIC PANEL
ALT: 23 U/L (ref 0–53)
AST: 33 U/L (ref 0–37)
Albumin: 4.4 g/dL (ref 3.5–5.2)
Alkaline Phosphatase: 60 U/L (ref 39–117)
BUN: 21 mg/dL (ref 6–23)
CO2: 31 mEq/L (ref 19–32)
Calcium: 9.3 mg/dL (ref 8.4–10.5)
Chloride: 102 mEq/L (ref 96–112)
Creatinine, Ser: 1.07 mg/dL (ref 0.40–1.50)
GFR: 70.71 mL/min (ref >60.00)
Glucose, Bld: 96 mg/dL (ref 70–99)
Potassium: 4.6 mEq/L (ref 3.5–5.1)
Sodium: 139 mEq/L (ref 135–145)
Total Bilirubin: 0.7 mg/dL (ref 0.2–1.2)
Total Protein: 8 g/dL (ref 6.0–8.3)

## 2021-09-10 LAB — LIPID PANEL
Cholesterol: 201 mg/dL — ABNORMAL HIGH (ref 0–200)
HDL: 48.6 mg/dL (ref >39.00)
LDL Cholesterol: 128 mg/dL — ABNORMAL HIGH (ref 0–99)
NonHDL: 152.16
Total CHOL/HDL Ratio: 4
Triglycerides: 122 mg/dL (ref 0.0–149.0)
VLDL: 24.4 mg/dL (ref 0.0–40.0)

## 2021-09-10 LAB — TSH: TSH: 1.79 u[IU]/mL (ref 0.35–5.50)

## 2021-09-10 LAB — HEMOGLOBIN A1C: Hgb A1c MFr Bld: 5.6 % (ref 4.6–6.5)

## 2021-09-10 LAB — PSA: PSA: 0.78 ng/mL (ref 0.10–4.00)

## 2021-09-11 NOTE — Progress Notes (Signed)
Please inform patient of the following:  His cholesterol is borderline elevated but everything else is stable. Do not need to make any changes to his treatment plan at this time. Would like for him to keep working on diet and exercise and we can recheck in a year.  Jonathan Beasley. Jerline Pain, MD 09/11/2021 9:34 AM

## 2021-09-16 ENCOUNTER — Other Ambulatory Visit: Payer: Self-pay | Admitting: Family Medicine

## 2021-09-16 ENCOUNTER — Encounter: Payer: Self-pay | Admitting: Family Medicine

## 2021-09-16 DIAGNOSIS — M1990 Unspecified osteoarthritis, unspecified site: Secondary | ICD-10-CM

## 2021-09-16 DIAGNOSIS — M792 Neuralgia and neuritis, unspecified: Secondary | ICD-10-CM

## 2021-09-17 MED ORDER — HYDROCODONE-ACETAMINOPHEN 7.5-325 MG PO TABS: 1.0000 | ORAL_TABLET | Freq: Four times a day (QID) | ORAL | 0 refills | Status: DC | PRN

## 2021-10-13 ENCOUNTER — Other Ambulatory Visit: Payer: Self-pay | Admitting: Family Medicine

## 2021-10-13 DIAGNOSIS — M1990 Unspecified osteoarthritis, unspecified site: Secondary | ICD-10-CM

## 2021-10-13 DIAGNOSIS — M792 Neuralgia and neuritis, unspecified: Secondary | ICD-10-CM

## 2021-10-13 MED ORDER — HYDROCODONE-ACETAMINOPHEN 7.5-325 MG PO TABS: 1.0000 | ORAL_TABLET | Freq: Four times a day (QID) | ORAL | 0 refills | Status: DC | PRN

## 2021-11-03 ENCOUNTER — Encounter: Payer: Self-pay | Admitting: Family Medicine

## 2021-11-03 DIAGNOSIS — M792 Neuralgia and neuritis, unspecified: Secondary | ICD-10-CM

## 2021-11-03 DIAGNOSIS — M1990 Unspecified osteoarthritis, unspecified site: Secondary | ICD-10-CM

## 2021-11-03 MED ORDER — HYDROCODONE-ACETAMINOPHEN 7.5-325 MG PO TABS: 1.0000 | ORAL_TABLET | Freq: Four times a day (QID) | ORAL | 0 refills | Status: DC | PRN

## 2021-11-03 NOTE — Telephone Encounter (Signed)
I will refill his medication. Please have them schedule an appointment to discuss his son's sleep issue.

## 2021-11-24 ENCOUNTER — Other Ambulatory Visit: Payer: Self-pay | Admitting: Family Medicine

## 2021-11-24 ENCOUNTER — Encounter: Payer: Self-pay | Admitting: Family Medicine

## 2021-11-24 DIAGNOSIS — M792 Neuralgia and neuritis, unspecified: Secondary | ICD-10-CM

## 2021-11-24 DIAGNOSIS — M1990 Unspecified osteoarthritis, unspecified site: Secondary | ICD-10-CM

## 2021-11-25 MED ORDER — HYDROCODONE-ACETAMINOPHEN 7.5-325 MG PO TABS: 1.0000 | ORAL_TABLET | Freq: Four times a day (QID) | ORAL | 0 refills | Status: DC | PRN

## 2021-11-25 NOTE — Telephone Encounter (Signed)
See note

## 2021-11-25 NOTE — Telephone Encounter (Signed)
I appreciate the update. We will refill his medication. Would like for him to let us know if he is not getting better.  Jonathan Beasley. Jerline Pain, MD 11/25/2021 8:42 AM

## 2021-12-10 ENCOUNTER — Telehealth: Payer: Self-pay | Admitting: Family Medicine

## 2021-12-10 NOTE — Telephone Encounter (Signed)
Copied from [place] [id number]. Topic: Medicare AWV >> Dec 10, 2021 11:12 AM Harris-Coley, Hannah Beat wrote: Reason for CRM: Left message for patient to schedule Annual Wellness Visit.  Please schedule with Nurse Health Advisor Charlott Rakes, RN at [facility].  Please call [phone] ask for [facility]

## 2021-12-16 ENCOUNTER — Other Ambulatory Visit: Payer: Self-pay | Admitting: Family Medicine

## 2021-12-16 DIAGNOSIS — M1990 Unspecified osteoarthritis, unspecified site: Secondary | ICD-10-CM

## 2021-12-16 DIAGNOSIS — M792 Neuralgia and neuritis, unspecified: Secondary | ICD-10-CM

## 2021-12-17 MED ORDER — HYDROCODONE-ACETAMINOPHEN 7.5-325 MG PO TABS: 1.0000 | ORAL_TABLET | Freq: Four times a day (QID) | ORAL | 0 refills | Status: DC | PRN

## 2021-12-17 NOTE — Telephone Encounter (Signed)
See note

## 2021-12-18 NOTE — Telephone Encounter (Signed)
We sent in his norco earlier this week.  Jonathan Beasley. Jerline Pain, MD 12/18/2021 7:58 AM

## 2022-01-06 ENCOUNTER — Other Ambulatory Visit: Payer: Self-pay | Admitting: Family Medicine

## 2022-01-06 ENCOUNTER — Encounter: Payer: Self-pay | Admitting: Family Medicine

## 2022-01-06 DIAGNOSIS — M792 Neuralgia and neuritis, unspecified: Secondary | ICD-10-CM

## 2022-01-06 DIAGNOSIS — M1990 Unspecified osteoarthritis, unspecified site: Secondary | ICD-10-CM

## 2022-01-06 NOTE — Telephone Encounter (Signed)
See note

## 2022-01-07 MED ORDER — HYDROCODONE-ACETAMINOPHEN 7.5-325 MG PO TABS: 1.0000 | ORAL_TABLET | Freq: Four times a day (QID) | ORAL | 0 refills | Status: DC | PRN

## 2022-01-07 NOTE — Telephone Encounter (Signed)
I appreciate the update. I am sorry to hear about his dental infection. Hopefully his dentist will be able to get this taken care of. We refilled his norco yesterday.  Algis Greenhouse. Jerline Pain, MD 01/07/2022 10:27 AM

## 2022-01-09 ENCOUNTER — Other Ambulatory Visit: Payer: Self-pay | Admitting: Family Medicine

## 2022-01-09 DIAGNOSIS — M5416 Radiculopathy, lumbar region: Secondary | ICD-10-CM

## 2022-01-26 DIAGNOSIS — D485 Neoplasm of uncertain behavior of skin: Secondary | ICD-10-CM | POA: Diagnosis not present

## 2022-01-26 DIAGNOSIS — C44622 Squamous cell carcinoma of skin of right upper limb, including shoulder: Secondary | ICD-10-CM | POA: Diagnosis not present

## 2022-01-26 DIAGNOSIS — C4441 Basal cell carcinoma of skin of scalp and neck: Secondary | ICD-10-CM | POA: Diagnosis not present

## 2022-01-26 DIAGNOSIS — D225 Melanocytic nevi of trunk: Secondary | ICD-10-CM | POA: Diagnosis not present

## 2022-01-26 DIAGNOSIS — L570 Actinic keratosis: Secondary | ICD-10-CM | POA: Diagnosis not present

## 2022-01-26 DIAGNOSIS — L814 Other melanin hyperpigmentation: Secondary | ICD-10-CM | POA: Diagnosis not present

## 2022-01-26 DIAGNOSIS — L821 Other seborrheic keratosis: Secondary | ICD-10-CM | POA: Diagnosis not present

## 2022-02-03 ENCOUNTER — Other Ambulatory Visit: Payer: Self-pay | Admitting: Family Medicine

## 2022-02-03 DIAGNOSIS — M792 Neuralgia and neuritis, unspecified: Secondary | ICD-10-CM

## 2022-02-03 DIAGNOSIS — M1990 Unspecified osteoarthritis, unspecified site: Secondary | ICD-10-CM

## 2022-02-03 NOTE — Telephone Encounter (Signed)
FYI

## 2022-02-04 MED ORDER — HYDROCODONE-ACETAMINOPHEN 7.5-325 MG PO TABS: 1.0000 | ORAL_TABLET | Freq: Four times a day (QID) | ORAL | 0 refills | Status: DC | PRN

## 2022-02-05 ENCOUNTER — Other Ambulatory Visit: Payer: Self-pay | Admitting: Family Medicine

## 2022-02-05 ENCOUNTER — Encounter: Payer: Self-pay | Admitting: Family Medicine

## 2022-02-05 DIAGNOSIS — M792 Neuralgia and neuritis, unspecified: Secondary | ICD-10-CM

## 2022-02-05 DIAGNOSIS — M1990 Unspecified osteoarthritis, unspecified site: Secondary | ICD-10-CM

## 2022-02-10 MED ORDER — HYDROCODONE-ACETAMINOPHEN 7.5-325 MG PO TABS: 1.0000 | ORAL_TABLET | Freq: Four times a day (QID) | ORAL | 0 refills | Status: DC | PRN

## 2022-02-10 NOTE — Telephone Encounter (Signed)
Please see note and send Rx to [facility] [address] due to his pharmacy not having medication in stock. Thank you  ?

## 2022-02-15 MED ORDER — HYDROCODONE-ACETAMINOPHEN 7.5-325 MG PO TABS: 1.0000 | ORAL_TABLET | Freq: Four times a day (QID) | ORAL | 0 refills | Status: DC | PRN

## 2022-02-15 NOTE — Telephone Encounter (Signed)
Please see note.

## 2022-02-15 NOTE — Telephone Encounter (Addendum)
[facility] [address] [facility] and no idea on when it will be in. [phone], [address], [place], [place] [address] ?Phone [phone] ? ?They are holding it for him but can only do it for a short period of time. They are holding 60 tablets of 7.5 - 325 mg tablets. ?

## 2022-02-17 DIAGNOSIS — L570 Actinic keratosis: Secondary | ICD-10-CM | POA: Diagnosis not present

## 2022-02-17 DIAGNOSIS — L821 Other seborrheic keratosis: Secondary | ICD-10-CM | POA: Diagnosis not present

## 2022-02-17 DIAGNOSIS — L820 Inflamed seborrheic keratosis: Secondary | ICD-10-CM | POA: Diagnosis not present

## 2022-02-17 DIAGNOSIS — D049 Carcinoma in situ of skin, unspecified: Secondary | ICD-10-CM | POA: Diagnosis not present

## 2022-03-02 ENCOUNTER — Encounter: Payer: Self-pay | Admitting: Family Medicine

## 2022-03-02 ENCOUNTER — Other Ambulatory Visit: Payer: Self-pay | Admitting: Family Medicine

## 2022-03-02 DIAGNOSIS — M1990 Unspecified osteoarthritis, unspecified site: Secondary | ICD-10-CM

## 2022-03-02 DIAGNOSIS — M792 Neuralgia and neuritis, unspecified: Secondary | ICD-10-CM

## 2022-03-02 MED ORDER — HYDROCODONE-ACETAMINOPHEN 7.5-325 MG PO TABS: 1.0000 | ORAL_TABLET | Freq: Four times a day (QID) | ORAL | 0 refills | Status: DC | PRN

## 2022-03-02 NOTE — Telephone Encounter (Signed)
See note

## 2022-03-03 ENCOUNTER — Encounter: Payer: Self-pay | Admitting: Family Medicine

## 2022-03-03 ENCOUNTER — Other Ambulatory Visit: Payer: Self-pay | Admitting: *Deleted

## 2022-03-03 DIAGNOSIS — M1990 Unspecified osteoarthritis, unspecified site: Secondary | ICD-10-CM

## 2022-03-03 DIAGNOSIS — M792 Neuralgia and neuritis, unspecified: Secondary | ICD-10-CM

## 2022-03-03 NOTE — Telephone Encounter (Signed)
Dr. Jerline Pain, please see message and resend Hydrocodone. I have updated to the correct pharmacy. Thanks ?

## 2022-03-03 NOTE — Telephone Encounter (Signed)
Send to Dr Jerline Pain for refills  ?

## 2022-03-03 NOTE — Telephone Encounter (Signed)
Pt called in and stated his rx was sent to the wrong pharmacy. He is stating they are holding it for him due to all the other pharmacies being out currently. Please update. ? ?MEDICATION:HYDROcodone-acetaminophen (NORCO) 7.5-325 MG tablet ? ?[id number] [place] [address]  [phone] ?Fax:  [phone] ?  ? ?

## 2022-03-03 NOTE — Telephone Encounter (Signed)
Rx was sent in yesterday, 03/02/2022 by Dr. Jerline Pain. ?

## 2022-03-03 NOTE — Telephone Encounter (Signed)
Patient called stated [facility] to low on product at the pharmacy  ?[facility]ing [facility]  ?

## 2022-03-04 MED ORDER — HYDROCODONE-ACETAMINOPHEN 7.5-325 MG PO TABS: 1.0000 | ORAL_TABLET | Freq: Four times a day (QID) | ORAL | 0 refills | Status: DC | PRN

## 2022-03-04 NOTE — Telephone Encounter (Signed)
Dr. Jerline Pain, please resend Hydrocodone Rx. I am not sure what happened but went to wrong pharmacy and they do not have any supply. [facility] to [facility] [address]. I have changed the pharmacy again, but make sure it says [facility] when you send it. Sorry and Thank you. ?

## 2022-03-17 DIAGNOSIS — C44319 Basal cell carcinoma of skin of other parts of face: Secondary | ICD-10-CM | POA: Diagnosis not present

## 2022-03-22 ENCOUNTER — Encounter: Payer: Self-pay | Admitting: Family Medicine

## 2022-03-22 ENCOUNTER — Telehealth (INDEPENDENT_AMBULATORY_CARE_PROVIDER_SITE_OTHER): Payer: Medicare Other | Admitting: Family Medicine

## 2022-03-22 VITALS — Ht 71.0 in | Wt 168.0 lb

## 2022-03-22 DIAGNOSIS — M792 Neuralgia and neuritis, unspecified: Secondary | ICD-10-CM

## 2022-03-22 DIAGNOSIS — G2581 Restless legs syndrome: Secondary | ICD-10-CM | POA: Diagnosis not present

## 2022-03-22 DIAGNOSIS — M1990 Unspecified osteoarthritis, unspecified site: Secondary | ICD-10-CM

## 2022-03-22 MED ORDER — HYDROCODONE-ACETAMINOPHEN 5-325 MG PO TABS: 1.5000 | ORAL_TABLET | Freq: Four times a day (QID) | ORAL | 0 refills | Status: DC | PRN

## 2022-03-22 NOTE — Assessment & Plan Note (Signed)
At the base without red flags.  He has had trouble finding Norco in stock at his current dose.  We will send in 95-325 mg tablet with instruction to take 1-1/2 pills every 6 hours as needed.  Hopefully he will not have as much patient is availability with this dose formulation.  Medication helps ability to manage pain and maintain his activities of daily living.  No significant side effects. ?

## 2022-03-22 NOTE — Progress Notes (Signed)
? ?  Jonathan Beasley is a 71 y.o. male who presents today for a virtual office visit. ? ?Assessment/Plan:  ?Chronic Problems Addressed Today: ?Arthritis ?[place] the [place] without red flags.  He has had trouble finding Norco in stock at his current dose.  We will send in 95-325 mg tablet with instruction to take 1-1/2 pills every 6 hours as needed.  Hopefully he will not have as much patient is availability with this dose formulation.  Medication helps ability to manage pain and maintain his activities of daily living.  No significant side effects. ? ?Restless legs ?Doing well on gabapentin 300 to 500 mg daily. ? ? ?  ?Subjective:  ?HPI: ? ?See A/p for status of chronic conditions.  Is here to follow-up on chronic pain secondary to osteoarthritis.  He has had trouble finding medication in stock at his pharmacy and he is wondering about a dose change.  Medication works well when he takes it.  No significant side effects. ? ?   ?  ?Objective/Observations  ?Physical Exam: ?Gen: NAD, resting comfortably ?Pulm: Normal work of breathing ?Neuro: Grossly normal, moves all extremities ?Psych: Normal affect and thought content ? ?Virtual Visit via Video  ? ?I connected with Jonathan Beasley on 03/22/22 at  1:20 PM EDT by a video enabled telemedicine application and verified that I am speaking with the correct person using two identifiers. The limitations of evaluation and management by telemedicine and the availability of in person appointments were discussed. The patient expressed understanding and agreed to proceed.  ? ?Patient location: Home ?Provider location: [place] ?Persons participating in the virtual visit: Myself and Patient ?   ? ?Algis Greenhouse. Jerline Pain, MD ?03/22/2022 1:43 PM  ?

## 2022-03-22 NOTE — Assessment & Plan Note (Signed)
Doing well on gabapentin 300 to 500 mg daily. 

## 2022-04-15 ENCOUNTER — Other Ambulatory Visit: Payer: Self-pay | Admitting: Family Medicine

## 2022-04-15 MED ORDER — HYDROCODONE-ACETAMINOPHEN 5-325 MG PO TABS: 1.5000 | ORAL_TABLET | Freq: Four times a day (QID) | ORAL | 0 refills | Status: DC | PRN

## 2022-04-15 NOTE — Telephone Encounter (Signed)
Last visit: 03/22/22  Next visit: none scheduled  Last filled: 03/22/22  Quantity: 90

## 2022-05-11 ENCOUNTER — Other Ambulatory Visit: Payer: Self-pay | Admitting: Family Medicine

## 2022-05-11 ENCOUNTER — Encounter: Payer: Self-pay | Admitting: Family Medicine

## 2022-05-11 NOTE — Telephone Encounter (Signed)
..   Encourage patient to contact the pharmacy for refills or they can request refills through [facility]  LAST APPOINTMENT DATE:  03/22/22  NEXT APPOINTMENT DATE: 05/17/22  MEDICATION:  Norco  Is the patient out of medication? Only has 2 pills left.    [facility] [address]   Let patient know to contact pharmacy at the end of the day to make sure medication is ready.  Please notify patient to allow 48-72 hours to process  CLINICAL FILLS OUT ALL BELOW:   LAST REFILL:  QTY:  REFILL DATE:    OTHER COMMENTS:    Okay for refill?  Please advise

## 2022-05-12 MED ORDER — HYDROCODONE-ACETAMINOPHEN 5-325 MG PO TABS: 1.5000 | ORAL_TABLET | Freq: Four times a day (QID) | ORAL | 0 refills | Status: DC | PRN

## 2022-05-13 ENCOUNTER — Telehealth: Payer: Self-pay | Admitting: Family Medicine

## 2022-05-13 ENCOUNTER — Other Ambulatory Visit (HOSPITAL_BASED_OUTPATIENT_CLINIC_OR_DEPARTMENT_OTHER): Payer: Self-pay

## 2022-05-13 ENCOUNTER — Other Ambulatory Visit: Payer: Self-pay | Admitting: Family Medicine

## 2022-05-13 ENCOUNTER — Other Ambulatory Visit: Payer: Self-pay | Admitting: *Deleted

## 2022-05-13 ENCOUNTER — Encounter: Payer: Self-pay | Admitting: Family Medicine

## 2022-05-13 MED ORDER — HYDROCODONE-ACETAMINOPHEN 7.5-325 MG PO TABS
1.0000 | ORAL_TABLET | Freq: Four times a day (QID) | ORAL | 0 refills | Status: DC | PRN
  Filled 2022-05-13: qty 60, 15d supply, fill #0

## 2022-05-13 NOTE — Telephone Encounter (Signed)
Calling for update on prescription so he can leave town.  Please call asap.

## 2022-05-13 NOTE — Telephone Encounter (Signed)
Duplicate

## 2022-05-13 NOTE — Telephone Encounter (Addendum)
[facility] not [facility]   - HYDROcodone-acetaminophen (NORCO) 5-325 MG tablet  Pt states [facility] has requests [facility] to [facility].  Pt requests the old prescription to be sent to pharmacy listed below.  Pt states he has confirmed [facility] has the previous prescription.   Pls call if you have any questions.   Requests:  7.5-325 MG QT #60 To be sent to:   [facility] [place],  [place]  [id number] [phone]  HYDROcodone-acetaminophen [facility]) 7.5-325 MG tablet [[id number]]

## 2022-05-13 NOTE — Telephone Encounter (Signed)
Please see message below. [facility], would for medication to be sent to [facility]. Patient is also asking for different dose and quantity.

## 2022-05-17 ENCOUNTER — Encounter: Payer: Self-pay | Admitting: Family Medicine

## 2022-05-17 ENCOUNTER — Telehealth (INDEPENDENT_AMBULATORY_CARE_PROVIDER_SITE_OTHER): Payer: Medicare Other | Admitting: Family Medicine

## 2022-05-17 VITALS — Ht 71.0 in | Wt 168.0 lb

## 2022-05-17 DIAGNOSIS — G2581 Restless legs syndrome: Secondary | ICD-10-CM | POA: Diagnosis not present

## 2022-05-17 DIAGNOSIS — M47816 Spondylosis without myelopathy or radiculopathy, lumbar region: Secondary | ICD-10-CM

## 2022-05-17 DIAGNOSIS — M5416 Radiculopathy, lumbar region: Secondary | ICD-10-CM

## 2022-05-17 DIAGNOSIS — I10 Essential (primary) hypertension: Secondary | ICD-10-CM | POA: Diagnosis not present

## 2022-05-17 MED ORDER — AMLODIPINE BESYLATE 2.5 MG PO TABS: 2.5000 mg | ORAL_TABLET | Freq: Every day | ORAL | 3 refills | Status: DC

## 2022-05-17 MED ORDER — HYDROCODONE-ACETAMINOPHEN 7.5-325 MG PO TABS: 1.0000 | ORAL_TABLET | Freq: Four times a day (QID) | ORAL | 0 refills | Status: DC | PRN

## 2022-05-17 MED ORDER — GABAPENTIN 100 MG PO CAPS: ORAL_CAPSULE | ORAL | 3 refills | Status: DC

## 2022-05-17 NOTE — Assessment & Plan Note (Signed)
He will come into the office for a blood pressure check.  He will bring his home cuff to compare with.  He is interested in starting blood pressure medication at this point but would like a very low-dose of possible.  We will start amlodipine 2.5 mg daily.  We will continue to monitor at home.  Follow-up again in a few weeks.  We will titrate the dose as needed.

## 2022-05-17 NOTE — Assessment & Plan Note (Signed)
Doing well on gabapentin 300 to 500 mg daily.

## 2022-05-17 NOTE — Assessment & Plan Note (Signed)
Stable on Norco, gabapentin, and Flexeril.  Database without red flags.  No significant side effects of medications.  Medications help ability stay active and perform ADLs.

## 2022-05-17 NOTE — Progress Notes (Signed)
   Toribio Kiel Cockerell is a 71 y.o. male who presents today for a virtual office visit.  Assessment/Plan:  Chronic Problems Addressed Today: Essential hypertension He will come into the office for a blood pressure check.  He will bring his home cuff to compare with.  He is interested in starting blood pressure medication at this point but would like a very low-dose of possible.  We will start amlodipine 2.5 mg daily.  We will continue to monitor at home.  Follow-up again in a few weeks.  We will titrate the dose as needed.  Lumbar spondylosis Stable on Norco, gabapentin, and Flexeril.  Database without red flags.  No significant side effects of medications.  Medications help ability stay active and perform ADLs.  Restless legs Doing well on gabapentin 300 to 500 mg daily.     Subjective:  HPI:  See A/P for status of chronic conditions.  His home blood pressure readings have been elevated and variable. Possibly interested in starting a blood pressure medication however he would like to be on lowest dose possible.  Does not have any symptoms.  No chest pain or shortness of breath.       Objective/Observations  Physical Exam: Gen: NAD, resting comfortably Pulm: Normal work of breathing Neuro: Grossly normal, moves all extremities Psych: Normal affect and thought content  Virtual Visit via Video   I connected with Jaxyn Rout on 05/17/22 at  3:00 PM EDT by a video enabled telemedicine application and verified that I am speaking with the correct person using two identifiers. The limitations of evaluation and management by telemedicine and the availability of in person appointments were discussed. The patient expressed understanding and agreed to proceed.   Patient location: Home Provider location: [place] participating in the virtual visit: Myself and Patient     Algis Greenhouse. Jerline Pain, MD 05/17/2022 3:40 PM

## 2022-05-21 ENCOUNTER — Encounter: Payer: Self-pay | Admitting: Family Medicine

## 2022-05-25 ENCOUNTER — Other Ambulatory Visit: Payer: Self-pay | Admitting: *Deleted

## 2022-05-25 MED ORDER — HYDROCHLOROTHIAZIDE 12.5 MG PO TABS: 6.2500 mg | ORAL_TABLET | Freq: Every day | ORAL | 0 refills | Status: DC

## 2022-05-25 NOTE — Telephone Encounter (Signed)
I hate to hear that he had an adverse reaction to amlodipine.  Recommend starting HCTZ 6.25 mg daily.  Please send a new prescription.  Would like for him to check with [place] in a couple weeks.

## 2022-06-01 DIAGNOSIS — D0461 Carcinoma in situ of skin of right upper limb, including shoulder: Secondary | ICD-10-CM | POA: Diagnosis not present

## 2022-06-09 ENCOUNTER — Other Ambulatory Visit (HOSPITAL_BASED_OUTPATIENT_CLINIC_OR_DEPARTMENT_OTHER): Payer: Self-pay

## 2022-06-09 ENCOUNTER — Other Ambulatory Visit: Payer: Self-pay | Admitting: Family Medicine

## 2022-06-10 MED ORDER — HYDROCODONE-ACETAMINOPHEN 7.5-325 MG PO TABS: 1.0000 | ORAL_TABLET | Freq: Four times a day (QID) | ORAL | 0 refills | Status: DC | PRN

## 2022-06-10 NOTE — Telephone Encounter (Signed)
Pt requesting refill for Norco 7.5-325 mg tablet. Last OV 05/2022.

## 2022-06-14 DIAGNOSIS — M9906 Segmental and somatic dysfunction of lower extremity: Secondary | ICD-10-CM | POA: Diagnosis not present

## 2022-06-14 DIAGNOSIS — M9904 Segmental and somatic dysfunction of sacral region: Secondary | ICD-10-CM | POA: Diagnosis not present

## 2022-06-14 DIAGNOSIS — M9905 Segmental and somatic dysfunction of pelvic region: Secondary | ICD-10-CM | POA: Diagnosis not present

## 2022-06-14 DIAGNOSIS — M79652 Pain in left thigh: Secondary | ICD-10-CM | POA: Diagnosis not present

## 2022-06-14 DIAGNOSIS — M79672 Pain in left foot: Secondary | ICD-10-CM | POA: Diagnosis not present

## 2022-06-17 ENCOUNTER — Ambulatory Visit (INDEPENDENT_AMBULATORY_CARE_PROVIDER_SITE_OTHER): Payer: Medicare Other | Admitting: Sports Medicine

## 2022-06-17 DIAGNOSIS — M4316 Spondylolisthesis, lumbar region: Secondary | ICD-10-CM

## 2022-06-17 NOTE — Progress Notes (Addendum)
PCP: Jonathan Barrack, MD  Subjective:   HPI: Patient is a 71 y.o. male here for bilateral hip and thigh pain.  Patient describes severe tightness in his hamstrings and pain in his bilateral hips and glutes for several weeks. It started with left heel pain and then as he compensated, his right hip started to hurt. Now both are very painful. He has been unable to walk due to pain and loss of balance. He has fallen trying to walk without crutches. He states that he starts getting severe pain and cramping when he tries to stand for more than a few moments. Standing in a wide stance is easier than a narrow stance. He has been taking low dose gabapentin for his pain. He has also been getting shockwave therapy on his right hip.   He has been seen by Dr Jonathan Beasley - a spine specialist - for spondylolithesis.  He was advised he needed surgery.  Bruising on RT hip and lateral thigh from using a home bought ESWT machine.  Past Medical History:  Diagnosis Date   Achilles tendonitis 07/26/2013   Allergy    Arthritis    Hyperlipidemia    Loose body in knee 06/02/2010   Mallet finger 08/03/2011   Restless legs    Tear of right hamstring 03/01/2017    Current Outpatient Medications on File Prior to Visit  Medication Sig Dispense Refill   amLODipine (NORVASC) 2.5 MG tablet Take 1 tablet (2.5 mg total) by mouth daily. 90 tablet 3   aspirin 81 MG tablet Take 81 mg by mouth daily.     Coenzyme Q10-Levocarnitine (CO Q-10 PLUS PO) Take 10 mg by mouth daily.     cyclobenzaprine (FLEXERIL) 10 MG tablet TAKE 1 TABLET BY MOUTH THREE TIMES A DAY AS NEEDED 90 tablet 3   gabapentin (NEURONTIN) 100 MG capsule 1-2 AT BEDTIME AS NEEDED. 180 capsule 3   gabapentin (NEURONTIN) 300 MG capsule TAKE 1 CAPSULE BY MOUTH EVERYDAY AT BEDTIME 90 capsule 3   hydrochlorothiazide (HYDRODIURIL) 12.5 MG tablet Take 0.5 tablets (6.25 mg total) by mouth daily. 30 tablet 0   HYDROcodone-acetaminophen (NORCO) 7.5-325 MG tablet Take 1  tablet by mouth every 6 (six) hours as needed for moderate pain. 60 tablet 0   meloxicam (MOBIC) 15 MG tablet TAKE ONE TABLET BY MOUTH AS NEEDED FOR PAIN 90 tablet 3   Multiple Vitamin (MULTIVITAMIN) tablet Take 1 tablet by mouth daily.     niacin 500 MG tablet Take 1 tablet (500 mg total) by mouth at bedtime. 90 tablet 3   PROAIR RESPICLICK 998 (90 Base) MCG/ACT AEPB TAKE 2 PUFFS BY MOUTH EVERY 6 HOURS AS NEEDED 1 each 0   traZODone (DESYREL) 50 MG tablet TAKE 0.5-1 TABLETS (25-50 MG TOTAL) BY MOUTH AT BEDTIME AS NEEDED FOR SLEEP. 90 tablet 2   vitamin C (ASCORBIC ACID) 500 MG tablet Take 500 mg by mouth daily.     No current facility-administered medications on file prior to visit.    Past Surgical History:  Procedure Laterality Date   KNEE ARTHROSCOPY Right 2013    Allergies  Allergen Reactions   Penicillins Other (See Comments)    Childhood- "deathly ill"    BP (!) 176/98   Ht '5\' 11"'$  (1.803 m)   Wt 180 lb (81.6 kg)   BMI 25.10 kg/m       No data to display              No data to display  Objective:  Physical Exam:  Gen: NAD, comfortable in exam room Hip, bilateral:  Large are of ecchymosis over right hip. Passive [place] equivalent b/l without restriction. 30 degrees of ER and 30 degrees of IR on the right, with pain. Full flexion and extension. Tight hamstrings. 4/5 strength in the bilateral hip abductors. 4/5 in the left hamstring. 5/5 in right hamstring. Antalgic, wide-based gait with unsteadiness. Greater trochanter without tenderness to palpation. Tenderness over right piriformis. No SI joint tenderness and normal minimal SI movement. Positive FABER/FADIR on the right. Back: No TTP over midline or bony structures. Significant step off noted at L5-S1.   Review of MRI 2019:  degenerative lumbar disk disease and 6 mm spondylolithesis at L5/s1;  foraminal stenosis. Formainal stenosis L3 and L4   Assessment & Plan:  1. Bilateral hip pain and  weakness likely 2/2 spondylolisthesis. Significant weakness in the bilateral hip abductors and left hamstring which is likely 2/2 nerve impingement vs muscle weakness. Increase gabapentin from 300 mg nightly to 300 mg TID. If no improvement, can increase to 600 mg TID over the course of a few weeks. Recommend MRI of the lumbar spine to evaluate for progressive disease. Follow up in 2 weeks. Can consider strengthening exercises at that time.   Jonathan Beasley, MS4  I observed and examined the patient with the medical student and agree with assessment and plan.  Note reviewed and modified by me.  We will follow him closely but I am concerned he may need to return to the spine surgeon as he could have more slip of his spondy and neurologic impingement.  Repeat MRI. Jonathan Mcgill, MD  For MRI Valium for pre test anxiety Referral to Dr. Sherley Beasley for NS evaluation of spondylolithesis

## 2022-06-17 NOTE — Patient Instructions (Addendum)
You were seen for bilateral hip and hamstring pain. We think this is likely coming from your back due L5 spondylolisthesis seen on your prior MRI from 2019. We recommend repeating an MRI of your low back. Recommend rest and using  crutches due to your inability to balance.  You can start taking gabapentin 300 mg three times per day.  After two weeks, if you are still having pain you can increase the dose as follows: Week 3: 300 mg in the morning, 300 mg midday, and 600 mg at night.  Week 4: 600 mg  in the morning, 300 mg midday, and 600 mg at night. Week 5: 600 mg three times per day. Follow up in 2 weeks.

## 2022-06-18 DIAGNOSIS — M4316 Spondylolisthesis, lumbar region: Secondary | ICD-10-CM | POA: Insufficient documentation

## 2022-06-18 MED ORDER — GABAPENTIN 300 MG PO CAPS: 300.0000 mg | ORAL_CAPSULE | Freq: Three times a day (TID) | ORAL | 0 refills | Status: DC

## 2022-06-18 NOTE — Assessment & Plan Note (Signed)
I am concerned that this is progressive We will increase GB dose  If he is not improving rapidly repeat MRI and return to Spine surgeon may be needed

## 2022-06-20 ENCOUNTER — Encounter: Payer: Self-pay | Admitting: Sports Medicine

## 2022-06-21 ENCOUNTER — Other Ambulatory Visit: Payer: Self-pay | Admitting: Sports Medicine

## 2022-06-21 NOTE — Addendum Note (Signed)
Addended by: Cyd Silence on: 06/21/2022 04:15 PM   Modules accepted: Orders

## 2022-06-22 ENCOUNTER — Ambulatory Visit: Payer: Medicare Other | Admitting: Sports Medicine

## 2022-06-22 MED ORDER — DIAZEPAM 5 MG PO TABS: 5.0000 mg | ORAL_TABLET | Freq: Four times a day (QID) | ORAL | 0 refills | Status: DC | PRN

## 2022-06-22 NOTE — Addendum Note (Signed)
Addended by: Stefanie Libel B on: 06/22/2022 09:20 AM   Modules accepted: Orders

## 2022-06-24 ENCOUNTER — Encounter: Payer: Self-pay | Admitting: Sports Medicine

## 2022-06-30 ENCOUNTER — Ambulatory Visit
Admission: RE | Admit: 2022-06-30 | Discharge: 2022-06-30 | Disposition: A | Discharge status: Home / Self Care | Payer: Medicare Other | Source: Ambulatory Visit | Attending: Sports Medicine | Admitting: Sports Medicine

## 2022-06-30 DIAGNOSIS — M545 Low back pain, unspecified: Secondary | ICD-10-CM | POA: Diagnosis not present

## 2022-06-30 DIAGNOSIS — M4316 Spondylolisthesis, lumbar region: Secondary | ICD-10-CM | POA: Diagnosis not present

## 2022-06-30 DIAGNOSIS — M48061 Spinal stenosis, lumbar region without neurogenic claudication: Secondary | ICD-10-CM | POA: Diagnosis not present

## 2022-06-30 DIAGNOSIS — M4317 Spondylolisthesis, lumbosacral region: Secondary | ICD-10-CM | POA: Diagnosis not present

## 2022-07-01 ENCOUNTER — Ambulatory Visit: Payer: Medicare Other | Admitting: Sports Medicine

## 2022-07-01 DIAGNOSIS — M4316 Spondylolisthesis, lumbar region: Secondary | ICD-10-CM

## 2022-07-01 NOTE — Assessment & Plan Note (Signed)
While there is about the same degree of spondylolithesis on my read of the MRI, the new findings of a large mass obliterating the canal at L4/5 suggests a large disc herniation.    Referral to spine surgery. Continue gabapentin which is taking edge off pain.

## 2022-07-01 NOTE — Progress Notes (Signed)
PCP: Vivi Barrack, MD  Subjective:   HPI: Patient is a 71 y.o. male here for leg weakness and pain.  Patient following up after MRI of the lumbar spine. Waiting on read from radiologist though there is significant L4-5 likely disc bulging vs tumor. Continues to have significant pain that is keeping him from sleeping. He has noticed some weakness in his left leg. Most of his symptoms are worse on the left than on the right. The gabapentin has been helping a bit with his pain.   Past Medical History:  Diagnosis Date   Achilles tendonitis 07/26/2013   Allergy    Arthritis    Hyperlipidemia    Loose body in knee 06/02/2010   Mallet finger 08/03/2011   Restless legs    Tear of right hamstring 03/01/2017    Current Outpatient Medications on File Prior to Visit  Medication Sig Dispense Refill   amLODipine (NORVASC) 2.5 MG tablet Take 1 tablet (2.5 mg total) by mouth daily. 90 tablet 3   aspirin 81 MG tablet Take 81 mg by mouth daily.     Coenzyme Q10-Levocarnitine (CO Q-10 PLUS PO) Take 10 mg by mouth daily.     cyclobenzaprine (FLEXERIL) 10 MG tablet TAKE 1 TABLET BY MOUTH THREE TIMES A DAY AS NEEDED 90 tablet 3   diazepam (VALIUM) 5 MG tablet Take 1 tablet (5 mg total) by mouth every 6 (six) hours as needed for anxiety. 2 tablet 0   gabapentin (NEURONTIN) 100 MG capsule 1-2 AT BEDTIME AS NEEDED. 180 capsule 3   gabapentin (NEURONTIN) 300 MG capsule TAKE 1 CAPSULE BY MOUTH EVERYDAY AT BEDTIME 90 capsule 3   gabapentin (NEURONTIN) 300 MG capsule Take 1 capsule (300 mg total) by mouth 3 (three) times daily. 90 capsule 0   hydrochlorothiazide (HYDRODIURIL) 12.5 MG tablet Take 0.5 tablets (6.25 mg total) by mouth daily. 30 tablet 0   HYDROcodone-acetaminophen (NORCO) 7.5-325 MG tablet Take 1 tablet by mouth every 6 (six) hours as needed for moderate pain. 60 tablet 0   meloxicam (MOBIC) 15 MG tablet TAKE ONE TABLET BY MOUTH AS NEEDED FOR PAIN 90 tablet 3   Multiple Vitamin (MULTIVITAMIN)  tablet Take 1 tablet by mouth daily.     niacin 500 MG tablet Take 1 tablet (500 mg total) by mouth at bedtime. 90 tablet 3   PROAIR RESPICLICK 355 (90 Base) MCG/ACT AEPB TAKE 2 PUFFS BY MOUTH EVERY 6 HOURS AS NEEDED 1 each 0   traZODone (DESYREL) 50 MG tablet TAKE 0.5-1 TABLETS (25-50 MG TOTAL) BY MOUTH AT BEDTIME AS NEEDED FOR SLEEP. 90 tablet 2   vitamin C (ASCORBIC ACID) 500 MG tablet Take 500 mg by mouth daily.     No current facility-administered medications on file prior to visit.    Past Surgical History:  Procedure Laterality Date   KNEE ARTHROSCOPY Right 2013    Allergies  Allergen Reactions   Penicillins Other (See Comments)    Childhood- "deathly ill"    BP (!) 164/107   Ht '5\' 11"'$  (1.803 m)   BMI 25.10 kg/m       No data to display              No data to display              Objective:  Physical Exam:  Gen: NAD, comfortable in exam room Lumbar spine: no gross deformity or scoliosis; no swelling or ecchymosis. No skin changes. No TTP over the spinous processes,  paraspinal muscles, or SI joints b/l. Significant step off noted at L5-S1. 5/5 strength in bilateral hip flexion (L1/L2), bilateral knee extension (L3/L4), bilateral ankle dorsiflexion (L4/L5), and bilateral ankle plantar flexion (S1). 5/5 right great toe extension. 3/5 left great toe extension. Sensation intact in the L4-S1 nerve root distribution b/l, 2+ L4 and 1+ right S1 reflex and absent left S1 reflex. Wide-based shuffling gait.   MRI - large fragment of probable disc obliterates the lower canal at L4/5 Note this is encapsulated.  While disc is likely cause tumor cannot be excluded. KBF    Assessment & Plan:  1. Left leg pain and weakness. MRI showed significant L4-5 disc bulging corresponding with his weakness on physical exam. Exam has worsened since his last visit two weeks ago. Recommend urgent consultation with spine surgeon. Continue gabapentin for nerve pain.    [name],  MS4  I observed and examined the patient with the Medical student and agree with assessment and plan.  Note reviewed and modified by me. Strong likelihood that this is a large disc fragment herniated into the canal but compressing left nerve roots more.  Spine surgery called and will work in on cancellation. [name], MD

## 2022-07-03 ENCOUNTER — Other Ambulatory Visit: Payer: Medicare Other

## 2022-07-05 DIAGNOSIS — M5416 Radiculopathy, lumbar region: Secondary | ICD-10-CM | POA: Diagnosis not present

## 2022-07-06 ENCOUNTER — Other Ambulatory Visit: Payer: Self-pay | Admitting: Orthopedic Surgery

## 2022-07-06 DIAGNOSIS — M5416 Radiculopathy, lumbar region: Secondary | ICD-10-CM | POA: Diagnosis not present

## 2022-07-08 ENCOUNTER — Encounter: Payer: Self-pay | Admitting: Sports Medicine

## 2022-07-09 ENCOUNTER — Other Ambulatory Visit: Payer: Self-pay | Admitting: Orthopedic Surgery

## 2022-07-09 DIAGNOSIS — M5416 Radiculopathy, lumbar region: Secondary | ICD-10-CM

## 2022-07-10 ENCOUNTER — Other Ambulatory Visit: Payer: Self-pay | Admitting: Family Medicine

## 2022-07-12 ENCOUNTER — Ambulatory Visit
Admission: RE | Admit: 2022-07-12 | Discharge: 2022-07-12 | Disposition: A | Discharge status: Home / Self Care | Payer: Medicare Other | Source: Ambulatory Visit | Attending: Orthopedic Surgery | Admitting: Orthopedic Surgery

## 2022-07-12 DIAGNOSIS — M5416 Radiculopathy, lumbar region: Secondary | ICD-10-CM

## 2022-07-12 DIAGNOSIS — M7138 Other bursal cyst, other site: Secondary | ICD-10-CM | POA: Diagnosis not present

## 2022-07-12 DIAGNOSIS — M48061 Spinal stenosis, lumbar region without neurogenic claudication: Secondary | ICD-10-CM | POA: Diagnosis not present

## 2022-07-12 DIAGNOSIS — M4727 Other spondylosis with radiculopathy, lumbosacral region: Secondary | ICD-10-CM | POA: Diagnosis not present

## 2022-07-12 MED ORDER — METHYLPREDNISOLONE ACETATE 40 MG/ML INJ SUSP (RADIOLOG
80.0000 mg | Freq: Once | INTRAMUSCULAR | Status: AC
  Administered 2022-07-12: 120 mg via EPIDURAL

## 2022-07-12 MED ORDER — HYDROCHLOROTHIAZIDE 12.5 MG PO TABS: 6.2500 mg | ORAL_TABLET | Freq: Every day | ORAL | 0 refills | Status: DC

## 2022-07-12 MED ORDER — IOPAMIDOL (ISOVUE-M 200) INJECTION 41%
1.0000 mL | Freq: Once | INTRAMUSCULAR | Status: AC
  Administered 2022-07-12: 1 mL via EPIDURAL

## 2022-07-12 NOTE — Discharge Instructions (Signed)

## 2022-07-13 MED ORDER — HYDROCODONE-ACETAMINOPHEN 7.5-325 MG PO TABS
1.0000 | ORAL_TABLET | Freq: Four times a day (QID) | ORAL | 0 refills | Status: DC | PRN
Start: 2022-07-13 — End: 2022-08-10

## 2022-07-15 ENCOUNTER — Telehealth (INDEPENDENT_AMBULATORY_CARE_PROVIDER_SITE_OTHER): Payer: Medicare Other | Admitting: Family Medicine

## 2022-07-15 ENCOUNTER — Encounter: Payer: Self-pay | Admitting: Family Medicine

## 2022-07-15 VITALS — Ht 71.0 in | Wt 185.0 lb

## 2022-07-15 DIAGNOSIS — G2581 Restless legs syndrome: Secondary | ICD-10-CM | POA: Diagnosis not present

## 2022-07-15 DIAGNOSIS — M47816 Spondylosis without myelopathy or radiculopathy, lumbar region: Secondary | ICD-10-CM | POA: Diagnosis not present

## 2022-07-15 DIAGNOSIS — I10 Essential (primary) hypertension: Secondary | ICD-10-CM

## 2022-07-15 MED ORDER — HYDROCHLOROTHIAZIDE 12.5 MG PO TABS
12.5000 mg | ORAL_TABLET | Freq: Every day | ORAL | 0 refills | Status: DC
Start: 2022-07-15 — End: 2022-08-06

## 2022-07-15 NOTE — Assessment & Plan Note (Signed)
Doing well on gabapentin 300 to '500mg'$  daily.

## 2022-07-15 NOTE — Assessment & Plan Note (Signed)
Stable on norco, gabapentin, and flexeril. Does not need refill today. Medications help with ability to stay active and perform ADLs.

## 2022-07-15 NOTE — Progress Notes (Signed)
   Jonathan Beasley is a 71 y.o. male who presents today for a virtual office visit.  Assessment/Plan:  Chronic Problems Addressed Today: Essential hypertension He did not tolerate the amlodipine. Not adequately controlled on HCTZ 6.25. We will increase to 12.'5mg'$  daily. HE will continue to monitor at home and check in with [place] in a few weeks with his home readings.   Lumbar spondylosis Stable on norco, gabapentin, and flexeril. Does not need refill today. Medications help with ability to stay active and perform ADLs.   Restless legs Doing well on gabapentin 300 to '500mg'$  daily.     Subjective:  HPI:  See A/p for status of chronic conditions.         Objective/Observations  Physical Exam: Gen: NAD, resting comfortably Pulm: Normal work of breathing Neuro: Grossly normal, moves all extremities Psych: Normal affect and thought content  Virtual Visit via Video   I connected with Khayree Delellis on 07/15/22 at  1:40 PM EDT by a video enabled telemedicine application and verified that I am speaking with the correct person using two identifiers. The limitations of evaluation and management by telemedicine and the availability of in person appointments were discussed. The patient expressed understanding and agreed to proceed.   Patient location: Home Provider location: [place] participating in the virtual visit: Myself and Patient     Algis Greenhouse. Jerline Pain, MD 07/15/2022 2:43 PM

## 2022-07-15 NOTE — Assessment & Plan Note (Signed)
He did not tolerate the amlodipine. Not adequately controlled on HCTZ 6.25. We will increase to 12.'5mg'$  daily. HE will continue to monitor at home and check in with [place] in a few weeks with his home readings.

## 2022-07-16 ENCOUNTER — Other Ambulatory Visit: Payer: Self-pay | Admitting: Family Medicine

## 2022-08-05 ENCOUNTER — Ambulatory Visit: Payer: Medicare Other | Admitting: Sports Medicine

## 2022-08-05 DIAGNOSIS — M4316 Spondylolisthesis, lumbar region: Secondary | ICD-10-CM

## 2022-08-05 NOTE — Progress Notes (Signed)
Chief complaint low back pain and weakness in the left leg  Two falls this morning trying to run senior game 5K.  Is saying that the left leg may have given out.  Patient was seen in early August for degenerative lumbar disc disease with spondylolisthesis.  His MRI showed significant synovial cysts.  He had marked weakness and difficulty even walking when first seen.  He was seen by Dr. Lynann Bologna who agreed with trying to do injections to see if they would help.  He was also seen at neurosurgery where they wanted to do a 3 level fusion.  He chose to undergo the injections which were done August 28. He is having less pain and able to be more active.  He is having no significant bowel or bladder issues.  He still feels that his balance is poor on the left side.  He is a committed runner and could not pass up trying to walk and run a bit of the 5K in the senior games this morning.  Unfortunately after 1 mile he stumbled and fell.  He got back up after skinning his legs and hands and tried to go again and fell again about half mile later.  He came to see me for additional evaluation.  Physical exam Muscular older male who is in no acute distress BP 138/84   Ht '5\' 11"'$  (1.803 m)   BMI 25.80 kg/m  Gait is a bit wide-based but he has significantly improved his ability to walk Left hip abductors and hip rotators are very weak Eversion of the left foot is very weak With walking any distance he gets a partial foot drop on the left He is able to stand and walk on his toes Walking on his heels he is unable to fully dorsiflex the left foot 1 foot standing is pretty stable on the right but less stable on the left

## 2022-08-05 NOTE — Assessment & Plan Note (Signed)
While his spondylolisthesis does not look more exaggerated when we compare MRIs from 2019 and August of this year he is having more signs of lumbar radiculopathy The entire left leg is somewhat weaker particularly his hip abductors and his peroneal muscles  He clearly has improved with the corticosteroid injections He has pain level is less  I suggested beginning a hip abduction series with lateral leg lifts, hip rotation and lateral step ups Begin easy walks 2-3 times a day as long as they are not worsening his pain  He needs to give this more time and not try to push it with running or walking too fast until the left leg regain some strength  If his symptoms worsen he should return to the neurosurgeons See me again in 5 weeks and I will see if I can progress his conservative care

## 2022-08-06 ENCOUNTER — Encounter: Payer: Self-pay | Admitting: Family Medicine

## 2022-08-06 ENCOUNTER — Other Ambulatory Visit: Payer: Self-pay | Admitting: Family Medicine

## 2022-08-06 ENCOUNTER — Other Ambulatory Visit (HOSPITAL_BASED_OUTPATIENT_CLINIC_OR_DEPARTMENT_OTHER): Payer: Self-pay

## 2022-08-06 MED ORDER — HYDROCHLOROTHIAZIDE 12.5 MG PO TABS
12.5000 mg | ORAL_TABLET | Freq: Every day | ORAL | 3 refills | Status: DC
  Filled 2022-08-06: qty 90, 90d supply, fill #0
  Filled 2022-08-09: qty 70, 70d supply, fill #0
  Filled 2022-08-09: qty 20, 20d supply, fill #0

## 2022-08-06 NOTE — Telephone Encounter (Signed)
Patient states hydrochlorothiazide was sent to incorrect pharmacy.  [facility]    Please follow up with patient in regard once sent.

## 2022-08-06 NOTE — Telephone Encounter (Signed)
Last refill: 07/07/22 #60, 0 Last OV: 07/15/22 dx. Lumbar spondylosis

## 2022-08-07 ENCOUNTER — Other Ambulatory Visit: Payer: Self-pay | Admitting: Family Medicine

## 2022-08-09 ENCOUNTER — Other Ambulatory Visit (HOSPITAL_BASED_OUTPATIENT_CLINIC_OR_DEPARTMENT_OTHER): Payer: Self-pay

## 2022-08-10 ENCOUNTER — Encounter: Payer: Self-pay | Admitting: Family Medicine

## 2022-08-10 ENCOUNTER — Telehealth (INDEPENDENT_AMBULATORY_CARE_PROVIDER_SITE_OTHER): Payer: Medicare Other | Admitting: Family Medicine

## 2022-08-10 ENCOUNTER — Other Ambulatory Visit (HOSPITAL_BASED_OUTPATIENT_CLINIC_OR_DEPARTMENT_OTHER): Payer: Self-pay

## 2022-08-10 VITALS — Ht 71.0 in | Wt 185.0 lb

## 2022-08-10 DIAGNOSIS — M47816 Spondylosis without myelopathy or radiculopathy, lumbar region: Secondary | ICD-10-CM | POA: Diagnosis not present

## 2022-08-10 DIAGNOSIS — I10 Essential (primary) hypertension: Secondary | ICD-10-CM | POA: Diagnosis not present

## 2022-08-10 DIAGNOSIS — M5416 Radiculopathy, lumbar region: Secondary | ICD-10-CM | POA: Diagnosis not present

## 2022-08-10 MED ORDER — HYDROCODONE-ACETAMINOPHEN 7.5-325 MG PO TABS
1.0000 | ORAL_TABLET | Freq: Four times a day (QID) | ORAL | 0 refills | Status: DC | PRN
  Filled 2022-08-10: qty 60, 15d supply, fill #0

## 2022-08-10 MED ORDER — GABAPENTIN 300 MG PO CAPS: 600.0000 mg | ORAL_CAPSULE | Freq: Every day | ORAL | 3 refills | Status: DC

## 2022-08-10 MED ORDER — AZITHROMYCIN 250 MG PO TABS: ORAL_TABLET | ORAL | 0 refills | Status: DC

## 2022-08-10 NOTE — Assessment & Plan Note (Signed)
Blood pressure is controlled on HCTZ 12.5 mg daily.

## 2022-08-10 NOTE — Assessment & Plan Note (Signed)
Symptoms are worsened recently.  Is been following with orthopedics and sports medicine.  We will refill his Norco today.  Also has Flexeril on hand to use as needed.  Will increase his dose of gabapentin to 600 mg nightly.  He can take an additional 100-'200mg'$  as needed.  Database was reviewed without red flags.  Medications help with his ability stay active and perform ADLs.

## 2022-08-10 NOTE — Progress Notes (Signed)
   Jonathan Beasley is a 71 y.o. male who presents today for a virtual office visit.  Assessment/Plan:  New/Acute Problems: Sinusitis  No red flags. Given length of symptoms will start antibiotics. He has done well with azithromycin in the past. Will send in zpack today. Encouraged good hydration. Can continue OTC meds.   Chronic Problems Addressed Today: Lumbar spondylosis Symptoms are worsened recently.  Is been following with orthopedics and sports medicine.  We will refill his Norco today.  Also has Flexeril on hand to use as needed.  Will increase his dose of gabapentin to 600 mg nightly.  He can take an additional 100-'200mg'$  as needed.  Database was reviewed without red flags.  Medications help with his ability stay active and perform ADLs.  Essential hypertension Blood pressure is controlled on HCTZ 12.5 mg daily.     Subjective:  HPI:  See A/p for status of chronic conditions.    He has had concern for sinus infection. This started over a week ago. Symptoms include congestion, facial pain, drainage, and malaise. No fevers or chills. OTC medications have not helped. Consistent with prior sinus infections.        Objective/Observations  Physical Exam: Gen: NAD, resting comfortably Pulm: Normal work of breathing Neuro: Grossly normal, moves all extremities Psych: Normal affect and thought content  Virtual Visit via Video   I connected with Jonathan Beasley on 08/10/22 at  2:20 PM EDT by a video enabled telemedicine application and verified that I am speaking with the correct person using two identifiers. The limitations of evaluation and management by telemedicine and the availability of in person appointments were discussed. The patient expressed understanding and agreed to proceed.   Patient location: Home Provider location: [place] participating in the virtual visit: Myself and Patient     Algis Greenhouse. Jerline Pain, MD 08/10/2022 2:52 PM

## 2022-08-11 ENCOUNTER — Other Ambulatory Visit (HOSPITAL_BASED_OUTPATIENT_CLINIC_OR_DEPARTMENT_OTHER): Payer: Self-pay

## 2022-08-26 ENCOUNTER — Telehealth: Payer: Self-pay | Admitting: Family Medicine

## 2022-08-26 NOTE — Telephone Encounter (Signed)
Copied from [place] [phone]. Topic: Medicare AWV >> Aug 26, 2022 11:17 AM Devoria Glassing wrote: Reason for CRM: Left message for patient to schedule Annual Wellness Visit.  Please schedule with Nurse Health Advisor Charlott Rakes, RN at [facility]. This appt can be telephone or office visit. Please call [phone] ask for [facility]

## 2022-09-02 ENCOUNTER — Other Ambulatory Visit (HOSPITAL_BASED_OUTPATIENT_CLINIC_OR_DEPARTMENT_OTHER): Payer: Self-pay

## 2022-09-02 ENCOUNTER — Other Ambulatory Visit: Payer: Self-pay | Admitting: Family Medicine

## 2022-09-02 MED ORDER — HYDROCODONE-ACETAMINOPHEN 7.5-325 MG PO TABS
1.0000 | ORAL_TABLET | Freq: Four times a day (QID) | ORAL | 0 refills | Status: DC | PRN
  Filled 2022-09-02: qty 60, 15d supply, fill #0

## 2022-09-09 ENCOUNTER — Ambulatory Visit: Payer: Medicare Other | Admitting: Sports Medicine

## 2022-09-09 DIAGNOSIS — M4316 Spondylolisthesis, lumbar region: Secondary | ICD-10-CM

## 2022-09-09 NOTE — Progress Notes (Signed)
Chief complaint low back pain with significant spondylolisthesis, spinal stenosis and large bilateral synovial cysts  When first evaluated on August 3 the patient could not walk and showed significant weakness Since that time he has had both neurosurgical and orthopedic spine specialist evaluation He has subsequently had 3 facet joint and epidural injections  On his last visit he had improved but still had significant weakness  Today he comes in stating that he has pain is pretty well controlled If he coughs or sneezes he gets significant pain shooting down into his left leg He has resumed walking and exercising He is even done a little jogging  Neurosurgery was in favor of doing a lumbar fusion at 2-3 levels Orthopedic spine specialist suggested that might be needed as well to correct the spondylolisthesis and the other degenerative problems  He would like another opinion because he is starting to show improvement and wonders if additional injections might make this even better  Physical exam Older white male in no acute distress BP 118/84   Ht '5\' 11"'$  (1.803 m)   Wt 175 lb (79.4 kg)   BMI 24.41 kg/m   Compared to before he can now walk on his toes He can walk on his heels With tandem walk he has some balance difficulty No sign of foot drop today His hip range of motion is tight but is symmetrical Hip abduction strength has returned and is good as his hip flexion strength

## 2022-09-09 NOTE — Assessment & Plan Note (Signed)
I discussed with the patient that I think he is actually gotten more benefit from his injections that he thought at first He is clearly now able to walk and be more active He can continue with walking for exercise and gradual increase in activity and give this more time should he wish since his pain is now better controlled  I am happy to send him to Dr. Joylene Draft for another opinion should he wish Continue on the gabapentin 600 at night  I did advise him that he may still require lumbar fusion considering the changes in his spine but if he continues to improve there is no urgency to do this surgery immediately  Recheck with me in 2 months

## 2022-09-14 ENCOUNTER — Telehealth: Payer: Self-pay | Admitting: Family Medicine

## 2022-09-14 NOTE — Telephone Encounter (Signed)
Copied from [place] [phone]. Topic: Medicare AWV >> Sep 14, 2022 10:47 AM Gillis Santa wrote: Reason for CRM: LVM FOR PATIENT TO CALL [phone] TO SCHEDULE AWVI WITH HEALTH COACH

## 2022-09-14 NOTE — Telephone Encounter (Signed)
No notes

## 2022-09-17 DIAGNOSIS — M791 Myalgia, unspecified site: Secondary | ICD-10-CM | POA: Diagnosis not present

## 2022-09-17 DIAGNOSIS — M79662 Pain in left lower leg: Secondary | ICD-10-CM | POA: Diagnosis not present

## 2022-09-17 DIAGNOSIS — M79661 Pain in right lower leg: Secondary | ICD-10-CM | POA: Diagnosis not present

## 2022-09-17 DIAGNOSIS — I10 Essential (primary) hypertension: Secondary | ICD-10-CM | POA: Diagnosis not present

## 2022-09-20 ENCOUNTER — Other Ambulatory Visit: Payer: Self-pay | Admitting: Family Medicine

## 2022-09-20 ENCOUNTER — Other Ambulatory Visit (HOSPITAL_BASED_OUTPATIENT_CLINIC_OR_DEPARTMENT_OTHER): Payer: Self-pay

## 2022-09-20 ENCOUNTER — Encounter: Payer: Self-pay | Admitting: Family Medicine

## 2022-09-21 DIAGNOSIS — M5416 Radiculopathy, lumbar region: Secondary | ICD-10-CM | POA: Diagnosis not present

## 2022-09-21 DIAGNOSIS — M7130 Other bursal cyst, unspecified site: Secondary | ICD-10-CM | POA: Diagnosis not present

## 2022-09-21 DIAGNOSIS — M4316 Spondylolisthesis, lumbar region: Secondary | ICD-10-CM | POA: Diagnosis not present

## 2022-09-21 NOTE — Telephone Encounter (Signed)
Can we delete the rx request for the 7.5 and send the '5mg'$  dose back to the rx request?  Algis Greenhouse. Jerline Pain, MD 09/21/2022 1:02 PM

## 2022-09-22 ENCOUNTER — Encounter (HOSPITAL_BASED_OUTPATIENT_CLINIC_OR_DEPARTMENT_OTHER): Payer: Self-pay | Admitting: Pharmacist

## 2022-09-22 ENCOUNTER — Other Ambulatory Visit: Payer: Self-pay | Admitting: *Deleted

## 2022-09-22 ENCOUNTER — Other Ambulatory Visit (HOSPITAL_BASED_OUTPATIENT_CLINIC_OR_DEPARTMENT_OTHER): Payer: Self-pay

## 2022-09-22 NOTE — Telephone Encounter (Signed)
Rx Norco 7.5 deleted

## 2022-09-23 ENCOUNTER — Other Ambulatory Visit (HOSPITAL_BASED_OUTPATIENT_CLINIC_OR_DEPARTMENT_OTHER): Payer: Self-pay

## 2022-09-23 ENCOUNTER — Ambulatory Visit: Payer: Medicare Other

## 2022-09-23 ENCOUNTER — Other Ambulatory Visit: Payer: Self-pay | Admitting: *Deleted

## 2022-09-23 ENCOUNTER — Other Ambulatory Visit (HOSPITAL_COMMUNITY): Payer: Self-pay

## 2022-09-23 MED ORDER — HYDROCODONE-ACETAMINOPHEN 5-325 MG PO TABS
1.0000 | ORAL_TABLET | Freq: Four times a day (QID) | ORAL | 0 refills | Status: DC | PRN
  Filled 2022-09-23: qty 30, 8d supply, fill #0

## 2022-09-23 MED ORDER — HYDROCODONE-ACETAMINOPHEN 5-325 MG PO TABS: 1.0000 | ORAL_TABLET | Freq: Four times a day (QID) | ORAL | 0 refills | Status: DC | PRN

## 2022-09-23 NOTE — Telephone Encounter (Signed)
Patient requesting Rx Norco #90 Send to [facility]  #30 was send. Patient did not pickup yet

## 2022-09-27 ENCOUNTER — Telehealth: Payer: Medicare Other | Admitting: Family Medicine

## 2022-09-28 ENCOUNTER — Encounter: Payer: Self-pay | Admitting: Sports Medicine

## 2022-09-28 DIAGNOSIS — L570 Actinic keratosis: Secondary | ICD-10-CM | POA: Diagnosis not present

## 2022-09-28 DIAGNOSIS — H00015 Hordeolum externum left lower eyelid: Secondary | ICD-10-CM | POA: Diagnosis not present

## 2022-09-28 DIAGNOSIS — Z85828 Personal history of other malignant neoplasm of skin: Secondary | ICD-10-CM | POA: Diagnosis not present

## 2022-09-28 DIAGNOSIS — Z86007 Personal history of in-situ neoplasm of skin: Secondary | ICD-10-CM | POA: Diagnosis not present

## 2022-09-28 DIAGNOSIS — D229 Melanocytic nevi, unspecified: Secondary | ICD-10-CM | POA: Diagnosis not present

## 2022-09-28 DIAGNOSIS — H01139 Eczematous dermatitis of unspecified eye, unspecified eyelid: Secondary | ICD-10-CM | POA: Diagnosis not present

## 2022-09-28 DIAGNOSIS — Z08 Encounter for follow-up examination after completed treatment for malignant neoplasm: Secondary | ICD-10-CM | POA: Diagnosis not present

## 2022-09-29 ENCOUNTER — Other Ambulatory Visit: Payer: Self-pay

## 2022-09-29 DIAGNOSIS — M5416 Radiculopathy, lumbar region: Secondary | ICD-10-CM

## 2022-09-29 MED ORDER — GABAPENTIN 300 MG PO CAPS: 600.0000 mg | ORAL_CAPSULE | Freq: Three times a day (TID) | ORAL | 2 refills | Status: DC

## 2022-10-13 ENCOUNTER — Encounter: Payer: Self-pay | Admitting: Family Medicine

## 2022-10-13 ENCOUNTER — Other Ambulatory Visit: Payer: Self-pay | Admitting: Family Medicine

## 2022-10-14 MED ORDER — HYDROCODONE-ACETAMINOPHEN 5-325 MG PO TABS: 1.0000 | ORAL_TABLET | Freq: Four times a day (QID) | ORAL | 0 refills | Status: DC | PRN

## 2022-10-15 NOTE — Telephone Encounter (Signed)
See note

## 2022-10-15 NOTE — Telephone Encounter (Signed)
I appreciate the update. I refilled his norco yesterday.  Algis Greenhouse. Jerline Pain, MD 10/15/2022 12:30 PM

## 2022-10-28 DIAGNOSIS — M7130 Other bursal cyst, unspecified site: Secondary | ICD-10-CM | POA: Diagnosis not present

## 2022-10-29 ENCOUNTER — Other Ambulatory Visit: Payer: Self-pay | Admitting: Neurological Surgery

## 2022-10-29 DIAGNOSIS — H5203 Hypermetropia, bilateral: Secondary | ICD-10-CM | POA: Diagnosis not present

## 2022-10-29 DIAGNOSIS — H2513 Age-related nuclear cataract, bilateral: Secondary | ICD-10-CM | POA: Diagnosis not present

## 2022-10-29 DIAGNOSIS — H40012 Open angle with borderline findings, low risk, left eye: Secondary | ICD-10-CM | POA: Diagnosis not present

## 2022-11-01 ENCOUNTER — Other Ambulatory Visit: Payer: Self-pay | Admitting: Family Medicine

## 2022-11-02 ENCOUNTER — Telehealth: Payer: Self-pay | Admitting: Family Medicine

## 2022-11-02 ENCOUNTER — Ambulatory Visit (INDEPENDENT_AMBULATORY_CARE_PROVIDER_SITE_OTHER): Payer: Medicare Other | Admitting: Family Medicine

## 2022-11-02 VITALS — BP 167/103 | HR 63 | Temp 97.3°F | Ht 71.0 in | Wt 176.4 lb

## 2022-11-02 DIAGNOSIS — Z1159 Encounter for screening for other viral diseases: Secondary | ICD-10-CM | POA: Diagnosis not present

## 2022-11-02 DIAGNOSIS — M5416 Radiculopathy, lumbar region: Secondary | ICD-10-CM | POA: Diagnosis not present

## 2022-11-02 DIAGNOSIS — I10 Essential (primary) hypertension: Secondary | ICD-10-CM

## 2022-11-02 DIAGNOSIS — G2581 Restless legs syndrome: Secondary | ICD-10-CM | POA: Diagnosis not present

## 2022-11-02 DIAGNOSIS — E782 Mixed hyperlipidemia: Secondary | ICD-10-CM

## 2022-11-02 DIAGNOSIS — M47816 Spondylosis without myelopathy or radiculopathy, lumbar region: Secondary | ICD-10-CM | POA: Diagnosis not present

## 2022-11-02 DIAGNOSIS — Z0001 Encounter for general adult medical examination with abnormal findings: Secondary | ICD-10-CM | POA: Diagnosis not present

## 2022-11-02 DIAGNOSIS — Z23 Encounter for immunization: Secondary | ICD-10-CM | POA: Diagnosis not present

## 2022-11-02 DIAGNOSIS — M1990 Unspecified osteoarthritis, unspecified site: Secondary | ICD-10-CM | POA: Diagnosis not present

## 2022-11-02 DIAGNOSIS — R739 Hyperglycemia, unspecified: Secondary | ICD-10-CM

## 2022-11-02 DIAGNOSIS — M4316 Spondylolisthesis, lumbar region: Secondary | ICD-10-CM | POA: Diagnosis not present

## 2022-11-02 LAB — CBC
HCT: 49.4 % (ref 39.0–52.0)
Hemoglobin: 17 g/dL (ref 13.0–17.0)
MCHC: 34.4 g/dL (ref 30.0–36.0)
MCV: 91.6 fl (ref 78.0–100.0)
Platelets: 228 10*3/uL (ref 150.0–400.0)
RBC: 5.4 Mil/uL (ref 4.22–5.81)
RDW: 12.7 % (ref 11.5–15.5)
WBC: 4.8 10*3/uL (ref 4.0–10.5)

## 2022-11-02 LAB — LIPID PANEL
Cholesterol: 217 mg/dL — ABNORMAL HIGH (ref 0–200)
HDL: 45.5 mg/dL (ref >39.00)
LDL Cholesterol: 139 mg/dL — ABNORMAL HIGH (ref 0–99)
NonHDL: 171.8
Total CHOL/HDL Ratio: 5
Triglycerides: 166 mg/dL — ABNORMAL HIGH (ref 0.0–149.0)
VLDL: 33.2 mg/dL (ref 0.0–40.0)

## 2022-11-02 LAB — HEMOGLOBIN A1C: Hgb A1c MFr Bld: 5.7 % (ref 4.6–6.5)

## 2022-11-02 LAB — COMPREHENSIVE METABOLIC PANEL
ALT: 22 U/L (ref 0–53)
AST: 35 U/L (ref 0–37)
Albumin: 4.1 g/dL (ref 3.5–5.2)
Alkaline Phosphatase: 66 U/L (ref 39–117)
BUN: 25 mg/dL — ABNORMAL HIGH (ref 6–23)
CO2: 27 mEq/L (ref 19–32)
Calcium: 9.3 mg/dL (ref 8.4–10.5)
Chloride: 102 mEq/L (ref 96–112)
Creatinine, Ser: 1.07 mg/dL (ref 0.40–1.50)
GFR: 70.15 mL/min (ref >60.00)
Glucose, Bld: 97 mg/dL (ref 70–99)
Potassium: 4.2 mEq/L (ref 3.5–5.1)
Sodium: 138 mEq/L (ref 135–145)
Total Bilirubin: 0.6 mg/dL (ref 0.2–1.2)
Total Protein: 7.5 g/dL (ref 6.0–8.3)

## 2022-11-02 LAB — TSH: TSH: 1.85 u[IU]/mL (ref 0.35–5.50)

## 2022-11-02 MED ORDER — CYCLOBENZAPRINE HCL 10 MG PO TABS: 10.0000 mg | ORAL_TABLET | Freq: Three times a day (TID) | ORAL | 3 refills | Status: DC | PRN

## 2022-11-02 MED ORDER — GABAPENTIN 100 MG PO CAPS: ORAL_CAPSULE | ORAL | 3 refills | Status: DC

## 2022-11-02 MED ORDER — HYDROCODONE-ACETAMINOPHEN 5-325 MG PO TABS: 1.0000 | ORAL_TABLET | Freq: Four times a day (QID) | ORAL | 0 refills | Status: DC | PRN

## 2022-11-02 NOTE — Assessment & Plan Note (Signed)
Check lipids 

## 2022-11-02 NOTE — Patient Instructions (Signed)
It was very nice to see you today!  We will check blood work today.  I will refill your medications.  Will give your flu shot today.  Keep an eye on your blood pressure and let us know if it is persistently elevated.  Please come back to see me in 3 to 6 months.  Come back sooner if needed.  Take care, Dr Jerline Pain  PLEASE NOTE:  If you had any lab tests, please let us know if you have not heard back within a few days. You may see your results on mychart before we have a chance to review them but we will give you a call once they are reviewed by [place].   If we ordered any referrals today, please let us know if you have not heard from their office within the next week.   If you had any urgent prescriptions sent in today, please check with the pharmacy within an hour of our visit to make sure the prescription was transmitted appropriately.   Please try these tips to maintain a healthy lifestyle:  Eat at least 3 REAL meals and 1-2 snacks per day.  Aim for no more than 5 hours between eating.  If you eat breakfast, please do so within one hour of getting up.   Each meal should contain half fruits/vegetables, one quarter protein, and one quarter carbs (no bigger than a computer mouse)  Cut down on sweet beverages. This includes juice, soda, and sweet tea.   Drink at least 1 glass of water with each meal and aim for at least 8 glasses per day  Exercise at least 150 minutes every week.    Preventive Care 60 Years and Older, Male Preventive care refers to lifestyle choices and visits with your health care provider that can promote health and wellness. Preventive care visits are also called wellness exams. What can I expect for my preventive care visit? Counseling During your preventive care visit, your health care provider may ask about your: Medical history, including: Past medical problems. Family medical history. History of falls. Current health, including: Emotional  well-being. Home life and relationship well-being. Sexual activity. Memory and ability to understand (cognition). Lifestyle, including: Alcohol, nicotine or tobacco, and drug use. Access to firearms. Diet, exercise, and sleep habits. Work and work Statistician. Sunscreen use. Safety issues such as seatbelt and bike helmet use. Physical exam Your health care provider will check your: Height and weight. These may be used to calculate your BMI (body mass index). BMI is a measurement that tells if you are at a healthy weight. Waist circumference. This measures the distance around your waistline. This measurement also tells if you are at a healthy weight and may help predict your risk of certain diseases, such as type 2 diabetes and high blood pressure. Heart rate and blood pressure. Body temperature. Skin for abnormal spots. What immunizations do I need?  Vaccines are usually given at various ages, according to a schedule. Your health care provider will recommend vaccines for you based on your age, medical history, and lifestyle or other factors, such as travel or where you work. What tests do I need? Screening Your health care provider may recommend screening tests for certain conditions. This may include: Lipid and cholesterol levels. Diabetes screening. This is done by checking your blood sugar (glucose) after you have not eaten for a while (fasting). Hepatitis C test. Hepatitis B test. HIV (human immunodeficiency virus) test. STI (sexually transmitted infection) testing, if you are at  risk. Lung cancer screening. Colorectal cancer screening. Prostate cancer screening. Abdominal aortic aneurysm (AAA) screening. You may need this if you are a current or former smoker. Talk with your health care provider about your test results, treatment options, and if necessary, the need for more tests. Follow these instructions at home: Eating and drinking  Eat a diet that includes fresh fruits  and vegetables, whole grains, lean protein, and low-fat dairy products. Limit your intake of foods with high amounts of sugar, saturated fats, and salt. Take vitamin and mineral supplements as recommended by your health care provider. Do not drink alcohol if your health care provider tells you not to drink. If you drink alcohol: Limit how much you have to 0-2 drinks a day. Know how much alcohol is in your drink. In the [place] one drink equals one 12 oz bottle of beer (355 mL), one 5 oz glass of wine (148 mL), or one 1 oz glass of hard liquor (44 mL). Lifestyle Brush your teeth every morning and night with fluoride toothpaste. Floss one time each day. Exercise for at least 30 minutes 5 or more days each week. Do not use any products that contain nicotine or tobacco. These products include cigarettes, chewing tobacco, and vaping devices, such as e-cigarettes. If you need help quitting, ask your health care provider. Do not use drugs. If you are sexually active, practice safe sex. Use a condom or other form of protection to prevent STIs. Take aspirin only as told by your health care provider. Make sure that you understand how much to take and what form to take. Work with your health care provider to find out whether it is safe and beneficial for you to take aspirin daily. Ask your health care provider if you need to take a cholesterol-lowering medicine (statin). Find healthy ways to manage stress, such as: Meditation, yoga, or listening to music. Journaling. Talking to a trusted person. Spending time with friends and family. Safety Always wear your seat belt while driving or riding in a vehicle. Do not drive: If you have been drinking alcohol. Do not ride with someone who has been drinking. When you are tired or distracted. While texting. If you have been using any mind-altering substances or drugs. Wear a helmet and other protective equipment during sports activities. If you have firearms in  your house, make sure you follow all gun safety procedures. Minimize exposure to UV radiation to reduce your risk of skin cancer. What's next? Visit your health care provider once a year for an annual wellness visit. Ask your health care provider how often you should have your eyes and teeth checked. Stay up to date on all vaccines. This information is not intended to replace advice given to you by your health care provider. Make sure you discuss any questions you have with your health care provider. Document Revised: 04/29/2021 Document Reviewed: 04/29/2021 Elsevier Patient Education  [place].

## 2022-11-02 NOTE — Telephone Encounter (Signed)
Last OV: 08/10/22  Next OV: Today 11/02/22  Last filled: 10/14/22  Quantity: 90 w/ 0 refills

## 2022-11-02 NOTE — Assessment & Plan Note (Addendum)
Elevated today though typically well-controlled on HCTZ 12.5 mg once daily.  No signs of end organ damage. BP may be elevated due to his low back pain today.  Will continue current regimen for now and he will let us know if persistently elevated at home. Discussed reasons to return to care.

## 2022-11-02 NOTE — Progress Notes (Signed)
Chief Complaint:  Jonathan Beasley is a 71 y.o. male who presents today for his annual comprehensive physical exam.    Assessment/Plan:  New/Acute Problems: Stress He has been dealing with a lot of stress related to the health of his son.  They were recently in emergency department which did not go well.  Overall patient feels like things are manageable though they are still difficult.  His son will be trying to establish with a new psychiatrist in town as opposed to going to [facility] which hopefully would make things easier.  Advised patient to have his son schedule with [place] soon to discuss his medical issues including the inflammation on his left knee.  His son is our patient.   Chronic Problems Addressed Today: HLD (hyperlipidemia) Check lipids.  Restless legs Doing well on gabapentin 300 to 500 mg daily.  Will refill gabapentin today.  Arthritis Database no red flags.  Medications help with ability to stay functional and active.  He is on Norco 5-325 every 6 hours as needed.  No significant side effects.  Will refill today.  Follow-up in 3 months.  Lumbar spondylosis Has followed with neurosurgery and sports medicine for this.  Causing significant mount of pain as well as some sciatica symptoms as well.  Will continue his Norco and gabapentin as above.  Database was reviewed today without red flags.  No side effects from either medication.  Essential hypertension Elevated today though typically well-controlled on HCTZ 12.5 mg once daily.  No signs of end organ damage. BP may be elevated due to his low back pain today.  Will continue current regimen for now and he will let us know if persistently elevated at home. Discussed reasons to return to care.   Preventative Healthcare: Check labs today. Flu shot given today. He is due for colon cancer screening. He has tried to schedule twice in the last year but had to reschedule due health of his son. He has reportedly had colon cancer  screening at work year which has been negative. He will send those records to [place].   Patient Counseling(The following topics were reviewed and/or handout was given):  -Nutrition: Stressed importance of moderation in sodium/caffeine intake, saturated fat and cholesterol, caloric balance, sufficient intake of fresh fruits, vegetables, and fiber.  -Stressed the importance of regular exercise.   -Substance Abuse: Discussed cessation/primary prevention of tobacco, alcohol, or other drug use; driving or other dangerous activities under the influence; availability of treatment for abuse.   -Injury prevention: Discussed safety belts, safety helmets, smoke detector, smoking near bedding or upholstery.   -Sexuality: Discussed sexually transmitted diseases, partner selection, use of condoms, avoidance of unintended pregnancy and contraceptive alternatives.   -Dental health: Discussed importance of regular tooth brushing, flossing, and dental visits.  -Health maintenance and immunizations reviewed. Please refer to Health maintenance section.  Return to care in 1 year for next preventative visit.     Subjective:  HPI:  He has no acute complaints today. See A/p for status of chronic conditions. He has been dealing with declining mental health status of his son. His son is currently undergoing Moenkopi and is dealing with severe OCD.  They were recently in the emergency department due to concern for infection in his knee.     11/02/2022    8:07 AM  Depression screen PHQ 2/9  Decreased Interest 0  Down, Depressed, Hopeless 0  PHQ - 2 Score 0    Health Maintenance Due  Topic Date  Due   Medicare Annual Wellness (AWV)  Never done   COLONOSCOPY (Pts 45-10yr Insurance coverage will need to be confirmed)  Never done     ROS: Per HPI, otherwise a complete review of systems was negative.   PMH:  The following were reviewed and entered/updated in epic: Past Medical History:  Diagnosis Date   Achilles  tendonitis 07/26/2013   Allergy    Arthritis    Hyperlipidemia    Loose body in knee 06/02/2010   Mallet finger 08/03/2011   Restless legs    Tear of right hamstring 03/01/2017   Patient Active Problem List   Diagnosis Date Noted   Spondylolisthesis of lumbar region 06/18/2022   Essential hypertension 05/17/2022   Exercise-induced asthma 01/29/2021   Insomnia 04/28/2020   Myalgia due to statin 08/15/2018   Lumbar spondylosis 08/29/2017   Restless legs    Arthritis    HLD (hyperlipidemia) 05/13/2014   Past Surgical History:  Procedure Laterality Date   KNEE ARTHROSCOPY Right 2013    Family History  Problem Relation Age of Onset   Heart disease Father    Heart disease Maternal Grandfather    Heart disease Paternal Grandfather     Medications- reviewed and updated Current Outpatient Medications  Medication Sig Dispense Refill   aspirin 81 MG tablet Take 81 mg by mouth daily.     Coenzyme Q10-Levocarnitine (CO Q-10 PLUS PO) Take 10 mg by mouth daily.     gabapentin (NEURONTIN) 300 MG capsule Take 2 capsules (600 mg total) by mouth 3 (three) times daily. 180 capsule 2   hydrochlorothiazide (HYDRODIURIL) 12.5 MG tablet Take 1 tablet (12.5 mg total) by mouth daily. 90 tablet 3   meloxicam (MOBIC) 15 MG tablet TAKE ONE TABLET BY MOUTH AS NEEDED FOR PAIN 90 tablet 3   Multiple Vitamin (MULTIVITAMIN) tablet Take 1 tablet by mouth daily.     niacin 500 MG tablet Take 1 tablet (500 mg total) by mouth at bedtime. 90 tablet 3   vitamin C (ASCORBIC ACID) 500 MG tablet Take 500 mg by mouth daily.     cyclobenzaprine (FLEXERIL) 10 MG tablet Take 1 tablet (10 mg total) by mouth 3 (three) times daily as needed. 90 tablet 3   gabapentin (NEURONTIN) 100 MG capsule 1-2 AT BEDTIME AS NEEDED. 180 capsule 3   HYDROcodone-acetaminophen (NORCO) 5-325 MG tablet Take 1 tablet by mouth every 6 (six) hours as needed for moderate pain. 90 tablet 0   No current facility-administered medications for this  visit.    Allergies-reviewed and updated Allergies  Allergen Reactions   Penicillins Other (See Comments)    Childhood- "deathly ill"    Social History   Socioeconomic History   Marital status: Married    Spouse name: Not on file   Number of children: Not on file   Years of education: Not on file   Highest education level: Not on file  Occupational History   Occupation: MARKETING  Tobacco Use   Smoking status: Never   Smokeless tobacco: Never  Substance and Sexual Activity   Alcohol use: No   Drug use: No   Sexual activity: Yes  Other Topics Concern   Not on file  Social History Narrative   Married   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 Wt Readings from Last 3 Encounters:  11/02/22 176 lb 6.4 oz (80 kg)  09/09/22 175 lb (79.4 kg)  08/10/22 185 lb (83.9 kg)   Gen: NAD, resting comfortably HEENT: TMs normal bilaterally. OP clear. No thyromegaly noted.  CV: RRR with no murmurs appreciated Pulm: NWOB, CTAB with no crackles, wheezes, or rhonchi GI: Normal bowel sounds present. Soft, Nontender, Nondistended. MSK: no edema, cyanosis, or clubbing noted Skin: warm, dry Neuro: CN2-12 grossly intact. Strength 5/5 in upper and lower extremities. Reflexes symmetric and intact bilaterally.  Psych: Normal affect and thought content     Reika Callanan M. Jerline Pain, MD 11/02/2022 9:06 AM

## 2022-11-02 NOTE — Assessment & Plan Note (Signed)
Doing well on gabapentin 300 to 500 mg daily.  Will refill gabapentin today.

## 2022-11-02 NOTE — Assessment & Plan Note (Signed)
Has followed with neurosurgery and sports medicine for this.  Causing significant mount of pain as well as some sciatica symptoms as well.  Will continue his Norco and gabapentin as above.  Database was reviewed today without red flags.  No side effects from either medication.

## 2022-11-02 NOTE — Telephone Encounter (Signed)
Copied from [place] [phone]. Topic: Medicare AWV >> Nov 02, 2022 11:42 AM Gillis Santa wrote: Reason for CRM: LVM PATIENT TO CALL TO [place] [phone]

## 2022-11-02 NOTE — Assessment & Plan Note (Signed)
Database no red flags.  Medications help with ability to stay functional and active.  He is on Norco 5-325 every 6 hours as needed.  No significant side effects.  Will refill today.  Follow-up in 3 months.

## 2022-11-02 NOTE — Addendum Note (Signed)
Addended by: Loura Back on: 11/02/2022 10:40 AM   Modules accepted: Orders

## 2022-11-04 NOTE — Progress Notes (Signed)
Please inform patient of the following:  Cholesterol levels are up a bit.  He would benefit from starting cholesterol medication to improve numbers and low risk heart attack and stroke.  Please send in Lipitor 40 mg daily if he is agreeable to start.  Regardless he should continue to work on diet and exercise and we can recheck in a year.  The rest of the labs are all stable.

## 2022-11-16 ENCOUNTER — Other Ambulatory Visit: Payer: Self-pay

## 2022-11-16 ENCOUNTER — Encounter (HOSPITAL_COMMUNITY): Payer: Self-pay | Admitting: Neurological Surgery

## 2022-11-16 ENCOUNTER — Other Ambulatory Visit: Payer: Self-pay | Admitting: Neurological Surgery

## 2022-11-16 NOTE — Progress Notes (Signed)
PCP - Dr. Dimas Chyle  Aspirin Instructions: last dose 11/10/22  ERAS Protcol - Clears until 1100  Anesthesia review: N  Patient verbally denies any shortness of breath, fever, cough and chest pain during phone call   -------------  SDW INSTRUCTIONS given:  Your procedure is scheduled on 11/17/22.  Report to [facility] Main Entrance "A" at 1130 A.M., and check in at the Admitting office.  Call this number if you have problems the morning of surgery:  [phone]   Remember:  Do not eat after midnight the night before your surgery  You may drink clear liquids until 1100 the morning of your surgery.   Clear liquids allowed are: Water, Non-Citrus Juices (without pulp), Carbonated Beverages, Clear Tea, Black Coffee Only, and Gatorade    Take these medicines the morning of surgery with A SIP OF WATER  gabapentin (NEURONTIN)  HYDROcodone-acetaminophen (NORCO)-if needed  As of today, STOP taking any Aspirin (unless otherwise instructed by your surgeon) Aleve, Naproxen, Ibuprofen, Motrin, Advil, Goody's, BC's, all herbal medications, fish oil, and all vitamins.                      Do not wear jewelry, make up, or nail polish            Do not wear lotions, powders, perfumes/colognes, or deodorant.            Do not shave 48 hours prior to surgery.  Men may shave face and neck.            Do not bring valuables to the hospital.            [facility] is not responsible for any belongings or valuables.  Do NOT Smoke (Tobacco/Vaping) 24 hours prior to your procedure If you use a CPAP at night, you may bring all equipment for your overnight stay.   Contacts, glasses, dentures or bridgework may not be worn into surgery.      For patients admitted to the hospital, discharge time will be determined by your treatment team.   Patients discharged the day of surgery will not be allowed to drive home, and someone needs to stay with them for 24 hours.    Special instructions:   [facility]- Preparing For Surgery  Before surgery, you can play an important role. Because skin is not sterile, your skin needs to be as free of germs as possible. You can reduce the number of germs on your skin by washing with CHG (chlorahexidine gluconate) Soap before surgery.  CHG is an antiseptic cleaner which kills germs and bonds with the skin to continue killing germs even after washing.    Oral Hygiene is also important to reduce your risk of infection.  Remember - BRUSH YOUR TEETH THE MORNING OF SURGERY WITH YOUR REGULAR TOOTHPASTE  Please do not use if you have an allergy to CHG or antibacterial soaps. If your skin becomes reddened/irritated stop using the CHG.  Do not shave (including legs and underarms) for at least 48 hours prior to first CHG shower. It is OK to shave your face.  Please follow these instructions carefully.   Shower the NIGHT BEFORE SURGERY and the MORNING OF SURGERY with DIAL Soap.   Pat yourself dry with a CLEAN TOWEL.  Wear CLEAN PAJAMAS to bed the night before surgery  Place CLEAN SHEETS on your bed the night of your first shower and DO NOT SLEEP WITH PETS.   Day of Surgery:  Please shower morning of surgery  Wear Clean/Comfortable clothing the morning of surgery Do not apply any deodorants/lotions.   Remember to brush your teeth WITH YOUR REGULAR TOOTHPASTE.   Questions were answered. Patient verbalized understanding of instructions.

## 2022-11-17 ENCOUNTER — Ambulatory Visit (HOSPITAL_COMMUNITY): Payer: Medicare Other

## 2022-11-17 ENCOUNTER — Encounter (HOSPITAL_COMMUNITY): Payer: Self-pay | Admitting: Neurological Surgery

## 2022-11-17 ENCOUNTER — Other Ambulatory Visit: Payer: Self-pay

## 2022-11-17 ENCOUNTER — Ambulatory Visit (HOSPITAL_COMMUNITY): Payer: Medicare Other | Admitting: Anesthesiology

## 2022-11-17 ENCOUNTER — Encounter (HOSPITAL_COMMUNITY)
Admission: RE | Disposition: A | Discharge status: Home / Self Care | Payer: Self-pay | Source: Home / Self Care | Attending: Neurological Surgery

## 2022-11-17 ENCOUNTER — Ambulatory Visit (HOSPITAL_COMMUNITY)
Admission: RE | Admit: 2022-11-17 | Discharge: 2022-11-17 | Disposition: A | Discharge status: Home / Self Care | Payer: Medicare Other | Attending: Neurological Surgery | Admitting: Neurological Surgery

## 2022-11-17 DIAGNOSIS — I4891 Unspecified atrial fibrillation: Secondary | ICD-10-CM | POA: Diagnosis not present

## 2022-11-17 DIAGNOSIS — Z7982 Long term (current) use of aspirin: Secondary | ICD-10-CM | POA: Diagnosis not present

## 2022-11-17 DIAGNOSIS — Z5309 Procedure and treatment not carried out because of other contraindication: Secondary | ICD-10-CM | POA: Diagnosis not present

## 2022-11-17 DIAGNOSIS — M7138 Other bursal cyst, other site: Secondary | ICD-10-CM | POA: Diagnosis not present

## 2022-11-17 HISTORY — DX: Essential (primary) hypertension: I10

## 2022-11-17 LAB — PROTIME-INR
INR: 1.3 — ABNORMAL HIGH (ref 0.8–1.2)
Prothrombin Time: 15.9 seconds — ABNORMAL HIGH (ref 11.4–15.2)

## 2022-11-17 LAB — SURGICAL PCR SCREEN

## 2022-11-17 SURGERY — LUMBAR LAMINECTOMY/DECOMPRESSION MICRODISCECTOMY 1 LEVEL
Anesthesia: General | Site: Back | Laterality: Left

## 2022-11-17 MED ORDER — APIXABAN 5 MG PO TABS: 5.0000 mg | ORAL_TABLET | Freq: Two times a day (BID) | ORAL | 3 refills | Status: AC

## 2022-11-17 MED ORDER — MUPIROCIN 2 % EX OINT
TOPICAL_OINTMENT | CUTANEOUS | Status: AC
  Filled 2022-11-17: qty 22

## 2022-11-17 MED ORDER — VANCOMYCIN HCL IN DEXTROSE 1-5 GM/200ML-% IV SOLN
INTRAVENOUS | Status: AC
  Administered 2022-11-17: 1000 mg via INTRAVENOUS
  Filled 2022-11-17: qty 200

## 2022-11-17 MED ORDER — FENTANYL CITRATE (PF) 250 MCG/5ML IJ SOLN
INTRAMUSCULAR | Status: AC
  Filled 2022-11-17: qty 5

## 2022-11-17 MED ORDER — LACTATED RINGERS IV SOLN: INTRAVENOUS | Status: DC

## 2022-11-17 MED ORDER — ONDANSETRON HCL 4 MG/2ML IJ SOLN
INTRAMUSCULAR | Status: AC
  Filled 2022-11-17: qty 2

## 2022-11-17 MED ORDER — CHLORHEXIDINE GLUCONATE CLOTH 2 % EX PADS: 6.0000 | MEDICATED_PAD | Freq: Once | CUTANEOUS | Status: DC

## 2022-11-17 MED ORDER — GABAPENTIN 300 MG PO CAPS: 300.0000 mg | ORAL_CAPSULE | ORAL | Status: AC

## 2022-11-17 MED ORDER — CHLORHEXIDINE GLUCONATE 0.12 % MT SOLN
OROMUCOSAL | Status: AC
  Administered 2022-11-17: 15 mL via OROMUCOSAL
  Filled 2022-11-17: qty 15

## 2022-11-17 MED ORDER — ACETAMINOPHEN 500 MG PO TABS: 1000.0000 mg | ORAL_TABLET | ORAL | Status: AC

## 2022-11-17 MED ORDER — LIDOCAINE 2% (20 MG/ML) 5 ML SYRINGE
INTRAMUSCULAR | Status: AC
  Filled 2022-11-17: qty 5

## 2022-11-17 MED ORDER — ROCURONIUM BROMIDE 10 MG/ML (PF) SYRINGE
PREFILLED_SYRINGE | INTRAVENOUS | Status: AC
  Filled 2022-11-17: qty 10

## 2022-11-17 MED ORDER — ACETAMINOPHEN 500 MG PO TABS
ORAL_TABLET | ORAL | Status: AC
  Administered 2022-11-17: 1000 mg via ORAL
  Filled 2022-11-17: qty 2

## 2022-11-17 MED ORDER — ORAL CARE MOUTH RINSE: 15.0000 mL | Freq: Once | OROMUCOSAL | Status: AC

## 2022-11-17 MED ORDER — VANCOMYCIN HCL IN DEXTROSE 1-5 GM/200ML-% IV SOLN: 1000.0000 mg | INTRAVENOUS | Status: AC

## 2022-11-17 MED ORDER — CHLORHEXIDINE GLUCONATE 0.12 % MT SOLN: 15.0000 mL | Freq: Once | OROMUCOSAL | Status: AC

## 2022-11-17 MED ORDER — GABAPENTIN 300 MG PO CAPS
ORAL_CAPSULE | ORAL | Status: AC
  Administered 2022-11-17: 300 mg via ORAL
  Filled 2022-11-17: qty 1

## 2022-11-17 MED ORDER — DEXAMETHASONE SODIUM PHOSPHATE 10 MG/ML IJ SOLN
INTRAMUSCULAR | Status: AC
  Filled 2022-11-17: qty 1

## 2022-11-17 NOTE — H&P (Signed)
Subjective: Patient is a 72 y.o. male admitted for L leg pain. Onset of symptoms was several months ago, gradually worsening since that time.  The pain is rated severe, and is located at the across the lower back and radiates to LLE. The pain is described as aching and occurs all day. The symptoms have been progressive. Symptoms are exacerbated by exercise and standing. MRI or CT showed large synovial cyst L4-5 B   Past Medical History:  Diagnosis Date   Achilles tendonitis 07/26/2013   Allergy    Arthritis    Hyperlipidemia    Hypertension    Loose body in knee 06/02/2010   Mallet finger 08/03/2011   Restless legs    Tear of right hamstring 03/01/2017    Past Surgical History:  Procedure Laterality Date   KNEE ARTHROSCOPY Right 2013    Prior to Admission medications   Medication Sig Start Date End Date Taking? Authorizing Provider  Ascorbic Acid (VITAMIN C) 500 MG CHEW Chew 250 mg by mouth daily.   Yes [provider]  aspirin 81 MG tablet Take 81 mg by mouth at bedtime.   Yes [provider]  Coenzyme Q10 (CO Q-10) 200 MG CAPS Take 200 mg by mouth at bedtime.   Yes [provider]  cyclobenzaprine (FLEXERIL) 10 MG tablet Take 1 tablet (10 mg total) by mouth 3 (three) times daily as needed. Patient taking differently: Take 10 mg by mouth 2 (two) times daily as needed for muscle spasms. 11/02/22  Yes Vivi Barrack, MD  gabapentin (NEURONTIN) 300 MG capsule Take 2 capsules (600 mg total) by mouth 3 (three) times daily. Patient taking differently: Take 600 mg by mouth See admin instructions. Take 600 mg at night, may take a second 600 mg dose during the day as needed for pain 09/29/22  Yes Stefanie Libel, MD  hydrochlorothiazide (HYDRODIURIL) 12.5 MG tablet Take 1 tablet (12.5 mg total) by mouth daily. 08/06/22  Yes Vivi Barrack, MD  HYDROcodone-acetaminophen (NORCO) 5-325 MG tablet Take 1 tablet by mouth every 6 (six) hours as needed for moderate  pain. Patient taking differently: Take 1 tablet by mouth 2 (two) times daily as needed for moderate pain. 11/02/22  Yes Vivi Barrack, MD  hydrocortisone 2.5 % cream Apply 1 Application topically 2 (two) times daily as needed (itching). 09/28/22  Yes [provider]  Inositol Niacinate (NIACIN FLUSH FREE) 500 MG CAPS Take 500 mg by mouth at bedtime.   Yes [provider]  Multiple Vitamin (MULTIVITAMIN) tablet Take 1 tablet by mouth daily.   Yes [provider]  gabapentin (NEURONTIN) 100 MG capsule 1-2 AT BEDTIME AS NEEDED. 11/02/22   Vivi Barrack, MD   Allergies  Allergen Reactions   Penicillins Other (See Comments)    Childhood- "deathly ill"   Shrimp (Diagnostic) Rash    Face turns red    Social History   Tobacco Use   Smoking status: Never   Smokeless tobacco: Never  Substance Use Topics   Alcohol use: No    Family History  Problem Relation Age of Onset   Heart disease Father    Heart disease Maternal Grandfather    Heart disease Paternal Grandfather      Review of Systems  Positive ROS: neg  All other systems have been reviewed and were otherwise negative with the exception of those mentioned in the HPI and as above.  Objective: Vital signs in last 24 hours: Temp:  [97.9 F (36.6 C)]  97.9 F (36.6 C) (01/03 1200) Pulse Rate:  [55] 55 (01/03 1200) Resp:  [18] 18 (01/03 1200) BP: (160)/(98) 160/98 (01/03 1200) SpO2:  [95 %] 95 % (01/03 1200) Weight:  [80 kg] 80 kg (01/03 1200)  General Appearance: Alert, cooperative, no distress, appears stated age Head: Normocephalic, without obvious abnormality, atraumatic Eyes: PERRL, conjunctiva/corneas clear, EOM's intact    Neck: Supple, symmetrical, trachea midline Back: Symmetric, no curvature, ROM normal, no CVA tenderness Lungs:  respirations unlabored Heart: Regular rate and rhythm Abdomen: Soft, non-tender Extremities: Extremities normal, atraumatic, no cyanosis or edema Pulses: 2+  and symmetric all extremities Skin: Skin color, texture, turgor normal, no rashes or lesions  NEUROLOGIC:   Mental status: Alert and oriented x4,  no aphasia, good attention span, fund of knowledge, and memory Motor Exam - grossly normal Sensory Exam - grossly normal Reflexes: 1+ Coordination - grossly normal Gait - grossly normal Balance - grossly normal Cranial Nerves: I: smell Not tested  II: visual acuity  OS: nl    OD: nl  II: visual fields Full to confrontation  II: pupils Equal, round, reactive to light  III,VII: ptosis None  III,IV,VI: extraocular muscles  Full ROM  V: mastication Normal  V: facial light touch sensation  Normal  V,VII: corneal reflex  Present  VII: facial muscle function - upper  Normal  VII: facial muscle function - lower Normal  VIII: hearing Not tested  IX: soft palate elevation  Normal  IX,X: gag reflex Present  XI: trapezius strength  5/5  XI: sternocleidomastoid strength 5/5  XI: neck flexion strength  5/5  XII: tongue strength  Normal    Data Review Lab Results  Component Value Date   WBC 4.8 11/02/2022   HGB 17.0 11/02/2022   HCT 49.4 11/02/2022   MCV 91.6 11/02/2022   PLT 228.0 11/02/2022   Lab Results  Component Value Date   NA 138 11/02/2022   K 4.2 11/02/2022   CL 102 11/02/2022   CO2 27 11/02/2022   BUN 25 (H) 11/02/2022   CREATININE 1.07 11/02/2022   GLUCOSE 97 11/02/2022   No results found for: "INR", "PROTIME"  Assessment/Plan:  Estimated body mass index is 24.6 kg/m as calculated from the following:   Height as of this encounter: '5\' 11"'$  (1.803 m).   Weight as of this encounter: 80 kg. Patient admitted for L LL for synovial cyst. Patient has failed a reasonable attempt at conservative therapy.  I explained the condition and procedure to the patient and answered any questions.  Patient wishes to proceed with procedure as planned. Understands risks/ benefits and typical outcomes of procedure.   Eustace Moore 11/17/2022 12:43 PM

## 2022-11-17 NOTE — Anesthesia Preprocedure Evaluation (Deleted)
Anesthesia Evaluation  Patient identified by MRN, date of birth, ID band Patient awake    Reviewed: Allergy & Precautions, H&P , NPO status , Patient's Chart, lab work & pertinent test results  Airway Mallampati: II  TM Distance: >3 FB Neck ROM: Full    Dental no notable dental hx.    Pulmonary neg pulmonary ROS   Pulmonary exam normal breath sounds clear to auscultation       Cardiovascular hypertension, Pt. on medications Normal cardiovascular exam Rhythm:Regular Rate:Normal     Neuro/Psych negative neurological ROS  negative psych ROS   GI/Hepatic negative GI ROS, Neg liver ROS,,,  Endo/Other  negative endocrine ROS    Renal/GU negative Renal ROS  negative genitourinary   Musculoskeletal negative musculoskeletal ROS (+)    Abdominal   Peds negative pediatric ROS (+)  Hematology negative hematology ROS (+)   Anesthesia Other Findings   Reproductive/Obstetrics negative OB ROS                             Anesthesia Physical Anesthesia Plan  ASA: 2  Anesthesia Plan: General   Post-op Pain Management: Ofirmev IV (intra-op)*   Induction: Intravenous  PONV Risk Score and Plan: 2 and Ondansetron, Dexamethasone and Treatment may vary due to age or medical condition  Airway Management Planned: Oral ETT  Additional Equipment:   Intra-op Plan:   Post-operative Plan: Extubation in OR  Informed Consent: I have reviewed the patients History and Physical, chart, labs and discussed the procedure including the risks, benefits and alternatives for the proposed anesthesia with the patient or authorized representative who has indicated his/her understanding and acceptance.     Dental advisory given  Plan Discussed with: CRNA and Surgeon  Anesthesia Plan Comments:        Anesthesia Quick Evaluation

## 2022-11-17 NOTE — Progress Notes (Signed)
Patient sx cancelled due to onset afib. Anesthesia and surgeon Dr. Ronnald Ramp aware. Cardiology consulted w/ patient. Patient to discharge home with wife. IV removed, catheter intact. Skin clean, dry and intact. Patient ambulated out of the hospital. Patient able to drive himself home

## 2022-11-17 NOTE — Progress Notes (Signed)
Cardiologist consulted for new afib. Patient has no prior cardiac issue. He has no cardiac awareness of afib. HR self controlled. On ASA at home. Reviewed EKG with Dr. Stanford Breed, ok to d/c from cardiac perspective. Will start on '5mg'$  BID Eliquis. Again, HR self controlled. He is scheduled to see Dr. Sallyanne Kuster next Monday. Will give 30 day free coupon card for Eliquis. Patient denies any prior bleeding issue.

## 2022-11-22 ENCOUNTER — Ambulatory Visit: Payer: Medicare Other | Attending: Cardiovascular Disease | Admitting: Cardiovascular Disease

## 2022-11-22 ENCOUNTER — Telehealth (INDEPENDENT_AMBULATORY_CARE_PROVIDER_SITE_OTHER): Payer: Medicare Other | Admitting: Family Medicine

## 2022-11-22 ENCOUNTER — Encounter: Payer: Self-pay | Admitting: Cardiovascular Disease

## 2022-11-22 VITALS — BP 130/86 | HR 70 | Ht 71.0 in | Wt 173.2 lb

## 2022-11-22 VITALS — Ht 71.0 in

## 2022-11-22 DIAGNOSIS — I4891 Unspecified atrial fibrillation: Secondary | ICD-10-CM | POA: Diagnosis not present

## 2022-11-22 DIAGNOSIS — I10 Essential (primary) hypertension: Secondary | ICD-10-CM

## 2022-11-22 DIAGNOSIS — E782 Mixed hyperlipidemia: Secondary | ICD-10-CM | POA: Diagnosis not present

## 2022-11-22 DIAGNOSIS — M47816 Spondylosis without myelopathy or radiculopathy, lumbar region: Secondary | ICD-10-CM

## 2022-11-22 DIAGNOSIS — Z01810 Encounter for preprocedural cardiovascular examination: Secondary | ICD-10-CM

## 2022-11-22 MED ORDER — HYDROCODONE-ACETAMINOPHEN 5-325 MG PO TABS: 1.0000 | ORAL_TABLET | Freq: Four times a day (QID) | ORAL | 0 refills | Status: DC | PRN

## 2022-11-22 MED ORDER — AZITHROMYCIN 250 MG PO TABS: ORAL_TABLET | ORAL | 0 refills | Status: DC

## 2022-11-22 MED ORDER — HYDROCHLOROTHIAZIDE 25 MG PO TABS: 25.0000 mg | ORAL_TABLET | Freq: Every day | ORAL | 3 refills | Status: DC

## 2022-11-22 MED ORDER — METOPROLOL TARTRATE 100 MG PO TABS: 100.0000 mg | ORAL_TABLET | Freq: Once | ORAL | 0 refills | Status: DC

## 2022-11-22 NOTE — Progress Notes (Signed)
Cardiology Office Note:    Date:  11/23/2022   ID:  Jonathan Beasley, DOB [birth year], MRN [id number]  PCP:  Vivi Barrack, MD   [place] Providers Cardiologist:  None     Referring MD: Vivi Barrack, MD   Chief Complaint  Patient presents with   Consult  Jonathan Beasley is a 72 y.o. male who is being seen today for the evaluation of atrial fibrillation at the request of Vivi Barrack, MD.   History of Present Illness:    Jonathan Beasley is a 72 y.o. male with a hx of hyperlipidemia and hypertension who was incidentally found to have asymptomatic atrial fibrillation with spontaneously well-controlled rate during a preoperative evaluation for back surgery.  Jonathan Beasley has been athletic his whole life and continues to run on a regular basis, without any limitations related to shortness of breath or chest pain.  He is unaware of palpitations even though he is in atrial fibrillation again today.  His ventricular rate is 70 bpm despite the fact that he does not take any rate control medications.  He denies edema, orthopnea, PND, syncope, focal neurological events, frequent injuries or falls or bleeding problems.    He has had problems with degenerative lumbar disc disease and synovial cysts with nerve compression.  In September he fell twice while running a 5K because he felt like his left leg gave out.  He was evaluated by a sports medicine specialist, Dr. Oneida Alar who did find evidence of weakness of the left hip abductors and rotators and partial foot drop on the left, reduced dorsiflexion on the left.  He has left-sided sciatica pain and was scheduled to have surgery with Dr. Ronnald Ramp, currently plans on hold due to the new diagnosis of atrial fibrillation.  He reports that a very long time ago he had an echocardiogram which showed a dilated left atrium, attributable to athletic heart at that time.  I do not find any records of that in the electronic medical system.  He has  longstanding mild hypertension that has been well-controlled on monotherapy with hydrochlorothiazide.  He does not have diabetes mellitus and does not smoke cigarettes.  His lipid profile shows moderately high LDL cholesterol at 139 and borderline triglycerides at 166, normal HDL 45.  He has a strong family history of coronary disease.  His father had a myocardial infarction at age 40 but is a heavy smoker.  Both his paternal and maternal grandfathers, also smokers, had heart attacks at age 17 and 24.  He is under constant emotional stress due to his adult son's mental health issues.  Past Medical History:  Diagnosis Date   Achilles tendonitis 07/26/2013   Allergy    Arthritis    Hyperlipidemia    Hypertension    Loose body in knee 06/02/2010   Mallet finger 08/03/2011   Restless legs    Tear of right hamstring 03/01/2017    Past Surgical History:  Procedure Laterality Date   KNEE ARTHROSCOPY Right 2013    Current Medications: Current Meds  Medication Sig   apixaban (ELIQUIS) 5 MG TABS tablet Take 1 tablet (5 mg total) by mouth 2 (two) times daily.   Ascorbic Acid (VITAMIN C) 500 MG CHEW Chew 250 mg by mouth daily.   azithromycin (ZITHROMAX) 250 MG tablet Take 2 tabs day 1, then 1 tab daily   Coenzyme Q10 (CO Q-10) 200 MG CAPS Take 200 mg by mouth at bedtime.   cyclobenzaprine (FLEXERIL) 10  MG tablet Take 1 tablet (10 mg total) by mouth 3 (three) times daily as needed. (Patient taking differently: Take 10 mg by mouth 2 (two) times daily as needed for muscle spasms.)   gabapentin (NEURONTIN) 100 MG capsule 1-2 AT BEDTIME AS NEEDED.   gabapentin (NEURONTIN) 300 MG capsule Take 2 capsules (600 mg total) by mouth 3 (three) times daily. (Patient taking differently: Take 600 mg by mouth See admin instructions. Take 600 mg at night, may take a second 600 mg dose during the day as needed for pain)   hydrochlorothiazide (HYDRODIURIL) 25 MG tablet Take 1 tablet (25 mg total) by mouth daily.    HYDROcodone-acetaminophen (NORCO) 5-325 MG tablet Take 1 tablet by mouth every 6 (six) hours as needed for moderate pain.   Inositol Niacinate (NIACIN FLUSH FREE) 500 MG CAPS Take 500 mg by mouth at bedtime.   metoprolol tartrate (LOPRESSOR) 100 MG tablet Take 1 tablet (100 mg total) by mouth once for 1 dose. Take 2 hour prior to procedure.   Multiple Vitamin (MULTIVITAMIN) tablet Take 1 tablet by mouth daily.     Allergies:   Penicillins and Shrimp (diagnostic)   Social History   Socioeconomic History   Marital status: Married    Spouse name: Not on file   Number of children: Not on file   Years of education: Not on file   Highest education level: Not on file  Occupational History   Occupation: MARKETING  Tobacco Use   Smoking status: Never   Smokeless tobacco: Never  Substance and Sexual Activity   Alcohol use: No   Drug use: No   Sexual activity: Yes  Other Topics Concern   Not on file  Social History Narrative   Married   Secretary/administrator   Works in Secretary/administrator      Social Determinants of Health   Financial Resource Strain: Not on file  Food Insecurity: Not on file  Transportation Needs: Not on file  Physical Activity: Not on file  Stress: Not on file  Social Connections: Not on file     Family History: The patient's family history includes Heart disease in his father, maternal grandfather, and paternal grandfather.  ROS:   Please see the history of present illness.     All other systems reviewed and are negative.  EKGs/Labs/Other Studies Reviewed:    The following studies were reviewed today:   EKG:  EKG is ordered today.  The ekg ordered today demonstrates atrial fibrillation with controlled ventricular rate, otherwise normal tracing.  QTc is 388 ms.  Recent Labs: 11/02/2022: ALT 22; BUN 25; Creatinine, Ser 1.07; Hemoglobin 17.0; Platelets 228.0; Potassium 4.2; Sodium 138; TSH 1.85  Recent Lipid Panel    Component Value Date/Time   CHOL 217 (H)  11/02/2022 0854   TRIG 166.0 (H) 11/02/2022 0854   HDL 45.50 11/02/2022 0854   CHOLHDL 5 11/02/2022 0854   VLDL 33.2 11/02/2022 0854   LDLCALC 139 (H) 11/02/2022 0854   LDLCALC 106 (H) 08/26/2020 1017   LDLDIRECT 117.0 08/20/2019 0842     Risk Assessment/Calculations:    CHA2DS2-VASc Score = 2   This indicates a 2.2% annual risk of stroke. The patient's score is based upon: CHF History: 0 HTN History: 1 Diabetes History: 0 Stroke History: 0 Vascular Disease History: 0 Age Score: 1 Gender Score: 0           Physical Exam:    VS:  BP 130/86 (BP Location: Left Arm, Patient Position:  Sitting, Cuff Size: Normal)   Pulse 70   Ht '5\' 11"'$  (1.803 m)   Wt 173 lb 3.2 oz (78.6 kg)   SpO2 94%   BMI 24.16 kg/m     Wt Readings from Last 3 Encounters:  11/22/22 173 lb 3.2 oz (78.6 kg)  11/17/22 176 lb 5.9 oz (80 kg)  11/02/22 176 lb 6.4 oz (80 kg)     GEN: Lean and athletic, appears younger than stated age well nourished, well developed in no acute distress HEENT: Normal NECK: No JVD; No carotid bruits LYMPHATICS: No lymphadenopathy CARDIAC: Irregular, normal S1 and S2, no murmurs, rubs, gallops RESPIRATORY:  Clear to auscultation without rales, wheezing or rhonchi  ABDOMEN: Soft, non-tender, non-distended MUSCULOSKELETAL:  No edema; No deformity  SKIN: Warm and dry NEUROLOGIC:  Alert and oriented x 3 PSYCHIATRIC:  Normal affect   ASSESSMENT:    1. Atrial fibrillation, unspecified type ([place])   2. Essential hypertension   3. Mixed hyperlipidemia   4. Preoperative cardiovascular examination    PLAN:    In order of problems listed above:  AFib: Newly diagnosed, unknown chronicity.  Asymptomatic.  Spontaneously rate controlled.  No history of stroke or TIA or bleeding issues.  CHA2DS2-VASc score is 2.  Needs to be on long-term oral anticoagulation.  Eliquis 5 mg twice daily has been prescribed, but this can be safely interrupted for brief periods of time to allow him to  have surgery, with low risk of embolic events in the short-term.  The fact that he is ventricularly spontaneously well-controlled suggest that he also has AV node conduction abnormalities.  In the short-term that makes it easier to manage his arrhythmia without additional medications, but in the long-term makes it likely that he might eventually need a pacemaker.  Since the arrhythmia is newly diagnosed, we should discuss at least an attempt at cardioversion, but now is not the time to do this since he will require 3 weeks of anticoagulation before the cardioversion and 4 weeks of uninterrupted anticoagulation after the cardioversion.  This would lead to substantial delay in his back surgery.  Will discuss cardioversion after he has his back surgery and once we have more details about any possible underlying structural heart disease and the size of his left atrium.  Echocardiogram ordered. HTN: Fair control on hydrochlorothiazide monotherapy.  Avoid medications with negative chronotropic effect. HLP: Cholesterol is higher than desirable, but whether or not he should be on pharmacological therapy depends on the presence or absence of vascular disease.  He has a strong family history of early onset CAD in several male relatives, albeit all smokers.  Will schedule him for coronary CT angiogram.  If his coronary calcium score is high we will find significant coronary stenoses, target LDL cholesterol be less than 70 and he should receive lipid-lowering therapy with statins.  If his calcium score is 0, statin therapy can be withheld. Preop CV evaluation: He has excellent functional status and is completely asymptomatic.  The planned back surgery comes with a low risk of major cardiovascular complications.  I think he can go ahead with the planned surgery for relief of his symptoms of sciatica even if we do not have a complete cardiovascular workup yet.  Will not plan to do a cardioversion until after his surgery has  been completed and he has been back on anticoagulants for 3 weeks.           Medication Adjustments/Labs and Tests Ordered: Current medicines are reviewed at  length with the patient today.  Concerns regarding medicines are outlined above.  Orders Placed This Encounter  Procedures   CT CORONARY MORPH W/CTA COR W/SCORE W/CA W/CM &/OR WO/CM   Basic metabolic panel   EKG 67-TIWP   ECHOCARDIOGRAM COMPLETE   Meds ordered this encounter  Medications   metoprolol tartrate (LOPRESSOR) 100 MG tablet    Sig: Take 1 tablet (100 mg total) by mouth once for 1 dose. Take 2 hour prior to procedure.    Dispense:  1 tablet    Refill:  0    Patient Instructions  Medication Instructions:  Your physician recommends that you continue on your current medications as directed. Please refer to the Current Medication list given to you today.  *If you need a refill on your cardiac medications before your next appointment, please call your pharmacy*   Lab Work: Your physician recommends that you return for lab work in: within 7 days of coronary CTA   If you have labs (blood work) drawn today and your tests are completely normal, you will receive your results only by: [place] (if you have MyChart) OR A paper copy in the mail If you have any lab test that is abnormal or we need to change your treatment, we will call you to review the results.   Testing/Procedures: Your physician has requested that you have an echocardiogram. Echocardiography is a painless test that uses sound waves to create images of your heart. It provides your doctor with information about the size and shape of your heart and how well your heart's chambers and valves are working. This procedure takes approximately one hour. There are no restrictions for this procedure. Please do NOT wear cologne, perfume, aftershave, or lotions (deodorant is allowed). Please arrive 15 minutes prior to your appointment time. This procedure  will be done at [address]   Follow-Up: At [facility], you and your health needs are our priority.  As part of our continuing mission to provide you with exceptional heart care, we have created designated Provider Care Teams.  These Care Teams include your primary Cardiologist (physician) and Advanced Practice Providers (APPs -  Physician Assistants and Nurse Practitioners) who all work together to provide you with the care you need, when you need it.  We recommend signing up for the patient portal called "MyChart".  Sign up information is provided on this After Visit Summary.  MyChart is used to connect with patients for Virtual Visits (Telemedicine).  Patients are able to view lab/test results, encounter notes, upcoming appointments, etc.  Non-urgent messages can be sent to your provider as well.   To learn more about what you can do with MyChart, go to [email or web address].    Your next appointment:   6 month(s)  The format for your next appointment:   In Person  Provider:   Sanda Klein, MD   Other Instructions   Your cardiac CT will be scheduled at one of the below locations:   [facility] [address] [place], [place] [address] [phone]   If scheduled at [facility], please arrive at the [facility] and Children's Entrance (Entrance C2) of [facility] 30 minutes prior to test start time. You can use the FREE valet parking offered at entrance C (encouraged to control the heart rate for the test)  Proceed to the [facility] Radiology Department (first floor) to check-in and test prep.  All radiology patients and guests should  use entrance C2 at [facility], accessed from [facility], even though the hospital's physical address listed is [address].     Please follow these instructions carefully (unless otherwise directed):  Hold all erectile dysfunction medications at least 3  days (72 hrs) prior to test. (Ie viagra, cialis, sildenafil, tadalafil, etc) We will administer nitroglycerin during this exam.   On the Night Before the Test: Be sure to Drink plenty of water. Do not consume any caffeinated/decaffeinated beverages or chocolate 12 hours prior to your test. Do not take any antihistamines 12 hours prior to your test.  On the Day of the Test: Drink plenty of water until 1 hour prior to the test. Do not eat any food 1 hour prior to test. You may take your regular medications prior to the test.  Take metoprolol (Lopressor) two hours prior to test. HOLD Furosemide/Hydrochlorothiazide morning of the test.  After the Test: Drink plenty of water. After receiving IV contrast, you may experience a mild flushed feeling. This is normal. On occasion, you may experience a mild rash up to 24 hours after the test. This is not dangerous. If this occurs, you can take Benadryl 25 mg and increase your fluid intake. If you experience trouble breathing, this can be serious. If it is severe call 911 IMMEDIATELY. If it is mild, please call our office. If you take any of these medications: Glipizide/Metformin, Avandament, Glucavance, please do not take 48 hours after completing test unless otherwise instructed.  We will call to schedule your test 2-4 weeks out understanding that some insurance companies will need an authorization prior to the service being performed.   For non-scheduling related questions, please contact the cardiac imaging nurse navigator should you have any questions/concerns: Marchia Bond, Cardiac Imaging Nurse Navigator Gordy Clement, Cardiac Imaging Nurse Navigator [facility] Direct Office Dial: [phone]   For scheduling needs, including cancellations and rescheduling, please call [place], [phone].   Signed, Sanda Klein, MD  11/23/2022 11:27 AM    [place]

## 2022-11-22 NOTE — Assessment & Plan Note (Signed)
Follows with neurosurgery for this. Unfortunately his procedure canceled last week.  Still having a significant mount of pain.  He is on gabapentin 600 mg 3 times daily.  He is also on Norco as needed.  Will refill his Norco today.  Database was reviewed without red flags.  Hopefully will be able to wean down on medications once he gets his surgery.

## 2022-11-22 NOTE — Patient Instructions (Signed)
Medication Instructions:  Your physician recommends that you continue on your current medications as directed. Please refer to the Current Medication list given to you today.  *If you need a refill on your cardiac medications before your next appointment, please call your pharmacy*   Lab Work: Your physician recommends that you return for lab work in: within 7 days of coronary CTA   If you have labs (blood work) drawn today and your tests are completely normal, you will receive your results only by: [place] (if you have MyChart) OR A paper copy in the mail If you have any lab test that is abnormal or we need to change your treatment, we will call you to review the results.   Testing/Procedures: Your physician has requested that you have an echocardiogram. Echocardiography is a painless test that uses sound waves to create images of your heart. It provides your doctor with information about the size and shape of your heart and how well your heart's chambers and valves are working. This procedure takes approximately one hour. There are no restrictions for this procedure. Please do NOT wear cologne, perfume, aftershave, or lotions (deodorant is allowed). Please arrive 15 minutes prior to your appointment time. This procedure will be done at [address]   Follow-Up: At [facility], you and your health needs are our priority.  As part of our continuing mission to provide you with exceptional heart care, we have created designated Provider Care Teams.  These Care Teams include your primary Cardiologist (physician) and Advanced Practice Providers (APPs -  Physician Assistants and Nurse Practitioners) who all work together to provide you with the care you need, when you need it.  We recommend signing up for the patient portal called "MyChart".  Sign up information is provided on this After Visit Summary.  MyChart is used to connect with patients for Virtual Visits  (Telemedicine).  Patients are able to view lab/test results, encounter notes, upcoming appointments, etc.  Non-urgent messages can be sent to your provider as well.   To learn more about what you can do with MyChart, go to [email or web address].    Your next appointment:   6 month(s)  The format for your next appointment:   In Person  Provider:   Sanda Klein, MD   Other Instructions   Your cardiac CT will be scheduled at one of the below locations:   [facility] [address] [place], [place] [address] [phone]   If scheduled at [facility], please arrive at the [facility] and Children's Entrance (Entrance C2) of [facility] 30 minutes prior to test start time. You can use the FREE valet parking offered at entrance C (encouraged to control the heart rate for the test)  Proceed to the [facility] Radiology Department (first floor) to check-in and test prep.  All radiology patients and guests should use entrance C2 at [facility], accessed from [facility], even though the hospital's physical address listed is [address]     Please follow these instructions carefully (unless otherwise directed):  Hold all erectile dysfunction medications at least 3 days (72 hrs) prior to test. (Ie viagra, cialis, sildenafil, tadalafil, etc) We will administer nitroglycerin during this exam.   On the Night Before the Test: Be sure to Drink plenty of water. Do not consume any caffeinated/decaffeinated beverages or chocolate 12 hours prior to your test. Do not take any antihistamines 12 hours prior  to your test.  On the Day of the Test: Drink plenty of water until 1 hour prior to the test. Do not eat any food 1 hour prior to test. You may take your regular medications prior to the test.  Take metoprolol (Lopressor) two hours prior to test. HOLD Furosemide/Hydrochlorothiazide morning of the test.  After the Test: Drink  plenty of water. After receiving IV contrast, you may experience a mild flushed feeling. This is normal. On occasion, you may experience a mild rash up to 24 hours after the test. This is not dangerous. If this occurs, you can take Benadryl 25 mg and increase your fluid intake. If you experience trouble breathing, this can be serious. If it is severe call 911 IMMEDIATELY. If it is mild, please call our office. If you take any of these medications: Glipizide/Metformin, Avandament, Glucavance, please do not take 48 hours after completing test unless otherwise instructed.  We will call to schedule your test 2-4 weeks out understanding that some insurance companies will need an authorization prior to the service being performed.   For non-scheduling related questions, please contact the cardiac imaging nurse navigator should you have any questions/concerns: Marchia Bond, Cardiac Imaging Nurse Navigator Gordy Clement, Cardiac Imaging Nurse Navigator [facility] Direct Office Dial: [phone]   For scheduling needs, including cancellations and rescheduling, please call [place], [phone].

## 2022-11-22 NOTE — Assessment & Plan Note (Signed)
New onset. Has cardiology appointment later today. Anticoagualted on eliquis. Not currently symptomatic.

## 2022-11-22 NOTE — Progress Notes (Signed)
   Jonathan Beasley is a 72 y.o. male who presents today for a virtual office visit.  Assessment/Plan:  New/Acute Problems: Sinusitis  No red flags consistent with previous sinus infections. Given length of symptoms 9will start azithromycin. He has done well with this in the past. Can use OTC medications as needed. Discussed reasons to return to care. Follow up as needed.   Chronic Problems Addressed Today: Essential hypertension Home BP readings still not at goal. We will increase HCTZ '25mg'$  daily. He will continue to monitor at home and let us know if persistently elevated.   Atrial fibrillation ([place]) New onset. Has cardiology appointment later today. Anticoagualted on eliquis. Not currently symptomatic.   Lumbar spondylosis Follows with neurosurgery for this. Unfortunately his procedure canceled last week.  Still having a significant mount of pain.  He is on gabapentin 600 mg 3 times daily.  He is also on Norco as needed.  Will refill his Norco today.  Database was reviewed without red flags.  Hopefully will be able to wean down on medications once he gets his surgery.     Subjective:  HPI:  Patient here for follow up. See A/p for status of chronic conditions.   He was seen here about a month ago for his annual physical. At that time his blood pressure was elevated. We continued him on his HCTZ 12.'5mg'$  daily at that time.  Since our last visit he was schedule to have back surgery. During his pre-op evaluation he was incidentally found to have new onset atrial fibrillation and the surgery was cancelled. Cardiology was consulted and he was started on eliquis '5mg'$  bid and discharged to follow up with cardiology.   He has also been dealing with sinus congestion for the last week or so. Feels like previous sinus infections. A lot of mucus production. No fevers or chills.  No known sick contacts..       Objective/Observations  Physical Exam: Gen: NAD, resting comfortably Pulm: Normal  work of breathing Neuro: Grossly normal, moves all extremities Psych: Normal affect and thought content  Virtual Visit via Video   I connected with Hamid Brookens on 11/22/22 at  1:20 PM EST by a video enabled telemedicine application and verified that I am speaking with the correct person using two identifiers. The limitations of evaluation and management by telemedicine and the availability of in person appointments were discussed. The patient expressed understanding and agreed to proceed.   Patient location: Home Provider location: [place] participating in the virtual visit: Myself and Patient     Algis Greenhouse. Jerline Pain, MD 11/22/2022 2:07 PM

## 2022-11-22 NOTE — Assessment & Plan Note (Signed)
Home BP readings still not at goal. We will increase HCTZ '25mg'$  daily. He will continue to monitor at home and let us know if persistently elevated.

## 2022-11-23 ENCOUNTER — Encounter: Payer: Self-pay | Admitting: Cardiovascular Disease

## 2022-11-23 ENCOUNTER — Ambulatory Visit (HOSPITAL_COMMUNITY): Payer: Medicare Other | Attending: Cardiovascular Disease

## 2022-11-23 DIAGNOSIS — I4891 Unspecified atrial fibrillation: Secondary | ICD-10-CM | POA: Diagnosis not present

## 2022-11-23 DIAGNOSIS — E782 Mixed hyperlipidemia: Secondary | ICD-10-CM | POA: Diagnosis present

## 2022-11-23 LAB — ECHOCARDIOGRAM COMPLETE
MV M vel: 5.54 m/s
MV Peak grad: 122.8 mmHg
S' Lateral: 3.2 cm

## 2022-11-26 ENCOUNTER — Other Ambulatory Visit: Payer: Self-pay

## 2022-11-26 ENCOUNTER — Encounter (HOSPITAL_COMMUNITY): Payer: Self-pay | Admitting: Neurological Surgery

## 2022-11-26 NOTE — Progress Notes (Signed)
Anesthesia Chart Review:  Case: [id number] Date/Time: 11/29/22 1313   Procedure: Laminectomy for facet/synovial cyst - L4-L5 - left (Left: Back) - 3C   Anesthesia type: General   Pre-op diagnosis: Synovial cyst   Location: [facility] OR ROOM 19 / [place] [facility]   Surgeons: Eustace Moore, MD       DISCUSSION: Patient is a 72 year old male scheduled for the above procedure.  History includes never smoker, HTN, HLD, afib (diagnosed 11/17/22).  Surgery was initially planned for 11/17/22. He was a same day work-up, and EKG on arrival showed rate controlled afib of unknown duration. Patient was asymptomatic. Cardiology notified and Eliquis 5 mg BID recommended, although patient never started. He was set up for out-patient cardiology evaluation and was seen by Dr. Lorenza Cambridge on 11/22/22. He notes that patient has been athletic his whole life and runs on a regular basis including a 5K in September. He had been having issues with his LLE giving out. MRI showed significant spondylolisthesis, spinal stenosis and large bilateral synovial cysts. He has had some epidural injections, but now surgery recommended.   Per initial preoperative input by DR. Croitoru at 18/24 visit, "He has excellent functional status and is completely asymptomatic.  The planned back surgery comes with a low risk of major cardiovascular complications.  I think he can go ahead with the planned surgery for relief of his symptoms of sciatica even if we do not have a complete cardiovascular workup yet.  Will not plan to do a cardioversion until after his surgery has been completed and he has been back on anticoagulants for 3 weeks." He has since had an echocardiogram on 11/23/22 that showed LVEF 45-50%, global LV hypokinesis, normal RVSF, mildly enlarged RV, RVSP 33.6 mmHg, LA normal size, severely dilated RA, mild MR, moderate TR, moderate PR.  Dr. Sallyanne Kuster added, "Mild LV dysfunction.  Need to follow this up with workup for coronary artery disease (we have already  ordered a coronary CT angiogram).  I do not think we need to start medications for this yet, until we have a better understanding of the mechanism and duration of the abnormality.   Severe right atrial dilation raises concern for possible ASD.  I did not see any clear evidence of ASD on the transthoracic echo, but the most common types of ASD (secundum, primum, sinus venosus) should be readily evaluated by the CT as well.   Even with these findings, I still think he is at low risk for major cardiovascular complications with back surgery-I have already sent Dr. Ronnald Ramp a "clearance" letter."  I did call and speak with Mr. Fawcett on 11/26/22 because I saw that he was prescribed azithromycin for sinusitis. By note, he had sinus congestion for the past week or so and patient felt like he may be starting to get a sinus infection. No fever or chills. Decision made to go ahead and treat. Mr. Duarte says symptoms were mild, limited to nasal congestion but because his surgery had already been postponed once he wanted to be aggressive with treatment to avoid developing full blown acute sinusitis. He reported told Dr. Jerline Pain this who agreed to treat. He denied cough, congestion or SOB. Says he has finished azithromycin and feels well. He's aware that he should be well for surgery and if sick symptoms on the day of surgery then procedure could be delayed. Currently, he does sound well when I spoke with him over the phone.   Anesthesia team to evaluate on the day  of surgery. He had CMP and CBC on 11/02/22, PT/INR 11/17/22. Nasal PCR was invalid on 11/17/22.   VS:  BP Readings from Last 3 Encounters:  11/22/22 130/86  11/17/22 (!) 160/98  11/02/22 (!) 167/103   Pulse Readings from Last 3 Encounters:  11/22/22 70  11/17/22 (!) 55  11/02/22 63     PROVIDERS: Vivi Barrack, MD is PCP  Sanda Klein, MD is cardiologist  LABS: Lab results in [facility] as of 11/02/22 include: Lab Results  Component Value Date   WBC  4.8 11/02/2022   HGB 17.0 11/02/2022   HCT 49.4 11/02/2022   PLT 228.0 11/02/2022   GLUCOSE 97 11/02/2022   ALT 22 11/02/2022   AST 35 11/02/2022   NA 138 11/02/2022   K 4.2 11/02/2022   CL 102 11/02/2022   CREATININE 1.07 11/02/2022   BUN 25 (H) 11/02/2022   CO2 27 11/02/2022   TSH 1.85 11/02/2022   INR 1.3 (H) 11/17/2022   HGBA1C 5.7 11/02/2022     IMAGES: MRI L-spine 06/30/22: IMPRESSION: - Small left-sided disc protrusions L1-2 and L2-3 unchanged. - Moderate subarticular foraminal stenosis bilaterally L3-4 - Interval development of large bilateral synovial cysts at L4-5 projecting into the spinal canal causing severe spinal stenosis with moderate to severe subarticular and foraminal stenosis bilaterally - Moderate subarticular stenosis bilaterally L5-S1 unchanged.   EKG:  EKG 11/22/22: Afib at 70 bpm   EKG 11/17/22:  Atrial fibrillation at 66 bpm Right axis deviation Possible Anterior infarct , age undetermined When compared with ECG of 08-May-2004 20:59, rhythm is no longer sinus Confirmed by Minus Breeding [phone]) on 11/17/2022 8:05:55 PM   CV: Echo 11/23/22: IMPRESSIONS   1. Left ventricular ejection fraction, by estimation, is 45 to 50%. The  left ventricle has mildly decreased function. The left ventricle  demonstrates global hypokinesis. Left ventricular diastolic parameters are  indeterminate.   2. Right ventricular systolic function is normal. The right ventricular  size is mildly enlarged. There is normal pulmonary artery systolic  pressure. The estimated right ventricular systolic pressure is 05.6 mmHg.   3. Right atrial size was severely dilated.   4. The mitral valve is normal in structure. Mild mitral valve  regurgitation. No evidence of mitral stenosis.   5. Tricuspid valve regurgitation is moderate.   6. The aortic valve is normal in structure. Aortic valve regurgitation is  not visualized. No aortic stenosis is present.   7. Pulmonic valve  regurgitation is moderate.   8. The inferior vena cava is dilated in size with >50% respiratory  variability, suggesting right atrial pressure of 8 mmHg.    Past Medical History:  Diagnosis Date   Achilles tendonitis 07/26/2013   Allergy    Arthritis    Hyperlipidemia    Hypertension    Loose body in knee 06/02/2010   Mallet finger 08/03/2011   Restless legs    Tear of right hamstring 03/01/2017    Past Surgical History:  Procedure Laterality Date   KNEE ARTHROSCOPY Right 2013    MEDICATIONS: No current facility-administered medications for this encounter.    Ascorbic Acid (VITAMIN C) 500 MG CHEW   Coenzyme Q10 (CO Q-10) 200 MG CAPS   cyclobenzaprine (FLEXERIL) 10 MG tablet   gabapentin (NEURONTIN) 100 MG capsule   gabapentin (NEURONTIN) 300 MG capsule   hydrochlorothiazide (HYDRODIURIL) 25 MG tablet   HYDROcodone-acetaminophen (NORCO) 5-325 MG tablet   Inositol Niacinate (NIACIN FLUSH FREE) 500 MG CAPS   Multiple Vitamin (MULTIVITAMIN) tablet  apixaban (ELIQUIS) 5 MG TABS tablet   aspirin EC 81 MG tablet    Myra Gianotti, PA-C Surgical Short Stay/Anesthesiology [facility] Phone [phone] [facility] Phone [phone] 11/26/2022 4:09 PM

## 2022-11-26 NOTE — Anesthesia Preprocedure Evaluation (Addendum)
Anesthesia Evaluation  Patient identified by MRN, date of birth, ID band Patient awake    Reviewed: Allergy & Precautions, NPO status , Patient's Chart, lab work & pertinent test results  History of Anesthesia Complications Negative for: history of anesthetic complications  Airway Mallampati: III  TM Distance: >3 FB Neck ROM: Full    Dental  (+) Missing,    Pulmonary asthma    Pulmonary exam normal        Cardiovascular hypertension, Pt. on medications and Pt. on home beta blockers Normal cardiovascular exam+ dysrhythmias Atrial Fibrillation   Echo 11/23/22: IMPRESSIONS   1. Left ventricular ejection fraction, by estimation, is 45 to 50%. The  left ventricle has mildly decreased function. The left ventricle  demonstrates global hypokinesis. Left ventricular diastolic parameters are  indeterminate.   2. Right ventricular systolic function is normal. The right ventricular  size is mildly enlarged. There is normal pulmonary artery systolic  pressure. The estimated right ventricular systolic pressure is 33.6 mmHg.   3. Right atrial size was severely dilated.   4. The mitral valve is normal in structure. Mild mitral valve  regurgitation. No evidence of mitral stenosis.   5. Tricuspid valve regurgitation is moderate.   6. The aortic valve is normal in structure. Aortic valve regurgitation is  not visualized. No aortic stenosis is present.   7. Pulmonic valve regurgitation is moderate.   8. The inferior vena cava is dilated in size with >50% respiratory  variability, suggesting right atrial pressure of 8 mmHg.     Neuro/Psych negative neurological ROS  negative psych ROS   GI/Hepatic negative GI ROS, Neg liver ROS,,,  Endo/Other  negative endocrine ROS    Renal/GU negative Renal ROS  negative genitourinary   Musculoskeletal  (+) Arthritis , Osteoarthritis,  Synovial cyst   Abdominal   Peds  Hematology negative  hematology ROS (+) Lab Results      Component                Value               Date                      WBC                      4.8                 11/02/2022                HGB                      17.0                11/02/2022                HCT                      49.4                11/02/2022                MCV                      91.6                11/02/2022                PLT  228.0               11/02/2022             Lab Results      Component                Value               Date                      NA                       138                 11/02/2022                K                        4.2                 11/02/2022                CO2                      27                  11/02/2022                GLUCOSE                  97                  11/02/2022                BUN                      25 (H)              11/02/2022                CREATININE               1.07                11/02/2022                CALCIUM                  9.3                 11/02/2022                GFRNONAA                 >89                 04/10/2015              Anesthesia Other Findings   Reproductive/Obstetrics                             Anesthesia Physical Anesthesia Plan  ASA: 3  Anesthesia Plan: General   Post-op Pain Management: Celebrex PO (pre-op)*, Tylenol PO (pre-op)* and Ketamine IV*   Induction: Intravenous  PONV Risk Score and Plan: 2 and Ondansetron, Dexamethasone and Treatment may vary due to age or medical condition  Airway Management Planned: Mask and Oral ETT  Additional Equipment: None  Intra-op Plan:   Post-operative Plan: Extubation in OR  Informed Consent: I have reviewed the patients History and Physical, chart, labs and discussed the procedure including the risks, benefits and alternatives for the proposed anesthesia with the patient or authorized representative who has indicated his/her  understanding and acceptance.     Dental advisory given  Plan Discussed with: CRNA  Anesthesia Plan Comments: (See PAT note written 11/26/2022 by Shonna Chock, PA-C.  )        Anesthesia Quick Evaluation

## 2022-11-26 NOTE — Progress Notes (Signed)
Spoke with Jonathan Beasley for pre-op call. Jonathan Beasley was here last week for surgery but found to be in A-fib. Surgery was cancelled, saw a cardiology PA in pre-op. Was prescribed Eliquis. Jonathan Beasley states he did not start this prescription. Jonathan Beasley did see Dr. Sallyanne Kuster on 11/22/22 and had a Echo done 11/23/22. He states that Dr. Sallyanne Kuster did not ask him if he had started it and he didn't tell him he hadn't.   Reviewed shower instructions with Jonathan Beasley and he voiced understanding.

## 2022-11-29 ENCOUNTER — Ambulatory Visit (HOSPITAL_COMMUNITY)
Admission: RE | Admit: 2022-11-29 | Discharge: 2022-11-29 | Disposition: A | Discharge status: Home / Self Care | Payer: Medicare Other | Attending: Neurological Surgery | Admitting: Neurological Surgery

## 2022-11-29 ENCOUNTER — Encounter (HOSPITAL_COMMUNITY): Payer: Self-pay | Admitting: Neurological Surgery

## 2022-11-29 ENCOUNTER — Encounter (HOSPITAL_COMMUNITY)
Admission: RE | Disposition: A | Discharge status: Home / Self Care | Payer: Self-pay | Source: Home / Self Care | Attending: Neurological Surgery

## 2022-11-29 ENCOUNTER — Ambulatory Visit (HOSPITAL_COMMUNITY): Payer: Medicare Other | Admitting: Vascular Surgery

## 2022-11-29 ENCOUNTER — Ambulatory Visit (HOSPITAL_BASED_OUTPATIENT_CLINIC_OR_DEPARTMENT_OTHER): Payer: Medicare Other | Admitting: Vascular Surgery

## 2022-11-29 ENCOUNTER — Other Ambulatory Visit: Payer: Self-pay

## 2022-11-29 ENCOUNTER — Ambulatory Visit (HOSPITAL_COMMUNITY): Payer: Medicare Other

## 2022-11-29 DIAGNOSIS — Z79899 Other long term (current) drug therapy: Secondary | ICD-10-CM | POA: Insufficient documentation

## 2022-11-29 DIAGNOSIS — M7138 Other bursal cyst, other site: Secondary | ICD-10-CM

## 2022-11-29 DIAGNOSIS — M4317 Spondylolisthesis, lumbosacral region: Secondary | ICD-10-CM | POA: Insufficient documentation

## 2022-11-29 DIAGNOSIS — G2581 Restless legs syndrome: Secondary | ICD-10-CM | POA: Insufficient documentation

## 2022-11-29 DIAGNOSIS — J45909 Unspecified asthma, uncomplicated: Secondary | ICD-10-CM | POA: Insufficient documentation

## 2022-11-29 DIAGNOSIS — I10 Essential (primary) hypertension: Secondary | ICD-10-CM | POA: Insufficient documentation

## 2022-11-29 DIAGNOSIS — I4891 Unspecified atrial fibrillation: Secondary | ICD-10-CM

## 2022-11-29 DIAGNOSIS — M1990 Unspecified osteoarthritis, unspecified site: Secondary | ICD-10-CM | POA: Insufficient documentation

## 2022-11-29 DIAGNOSIS — Z7901 Long term (current) use of anticoagulants: Secondary | ICD-10-CM | POA: Diagnosis not present

## 2022-11-29 DIAGNOSIS — E785 Hyperlipidemia, unspecified: Secondary | ICD-10-CM | POA: Insufficient documentation

## 2022-11-29 DIAGNOSIS — I088 Other rheumatic multiple valve diseases: Secondary | ICD-10-CM | POA: Diagnosis not present

## 2022-11-29 DIAGNOSIS — I119 Hypertensive heart disease without heart failure: Secondary | ICD-10-CM

## 2022-11-29 DIAGNOSIS — M5416 Radiculopathy, lumbar region: Secondary | ICD-10-CM | POA: Insufficient documentation

## 2022-11-29 DIAGNOSIS — M48061 Spinal stenosis, lumbar region without neurogenic claudication: Secondary | ICD-10-CM | POA: Insufficient documentation

## 2022-11-29 HISTORY — DX: Cardiac arrhythmia, unspecified: I49.9

## 2022-11-29 HISTORY — PX: LUMBAR LAMINECTOMY/DECOMPRESSION MICRODISCECTOMY: SHX5026

## 2022-11-29 LAB — SURGICAL PCR SCREEN
MRSA, PCR: NEGATIVE
Staphylococcus aureus: NEGATIVE

## 2022-11-29 SURGERY — LUMBAR LAMINECTOMY/DECOMPRESSION MICRODISCECTOMY 1 LEVEL
Anesthesia: General | Site: Back | Laterality: Left

## 2022-11-29 MED ORDER — VANCOMYCIN HCL IN DEXTROSE 1-5 GM/200ML-% IV SOLN
1000.0000 mg | Freq: Once | INTRAVENOUS | Status: AC
  Administered 2022-11-29: 1000 mg via INTRAVENOUS
  Filled 2022-11-29: qty 200

## 2022-11-29 MED ORDER — BUPIVACAINE HCL (PF) 0.25 % IJ SOLN
INTRAMUSCULAR | Status: DC | PRN
  Administered 2022-11-29: 20 mL
  Administered 2022-11-29: 6 mL

## 2022-11-29 MED ORDER — LIDOCAINE 2% (20 MG/ML) 5 ML SYRINGE
INTRAMUSCULAR | Status: DC | PRN
  Administered 2022-11-29: 100 mg via INTRAVENOUS

## 2022-11-29 MED ORDER — FENTANYL CITRATE (PF) 250 MCG/5ML IJ SOLN
INTRAMUSCULAR | Status: AC
  Filled 2022-11-29: qty 5

## 2022-11-29 MED ORDER — CELECOXIB 200 MG PO CAPS
ORAL_CAPSULE | ORAL | Status: AC
  Administered 2022-11-29: 200 mg via ORAL
  Filled 2022-11-29: qty 1

## 2022-11-29 MED ORDER — FENTANYL CITRATE (PF) 250 MCG/5ML IJ SOLN
INTRAMUSCULAR | Status: DC | PRN
  Administered 2022-11-29: 100 ug via INTRAVENOUS
  Administered 2022-11-29: 50 ug via INTRAVENOUS

## 2022-11-29 MED ORDER — ACETAMINOPHEN 500 MG PO TABS: 1000.0000 mg | ORAL_TABLET | Freq: Once | ORAL | Status: AC

## 2022-11-29 MED ORDER — ORAL CARE MOUTH RINSE: 15.0000 mL | Freq: Once | OROMUCOSAL | Status: AC

## 2022-11-29 MED ORDER — HYDROCODONE-ACETAMINOPHEN 5-325 MG PO TABS: 1.0000 | ORAL_TABLET | ORAL | 0 refills | Status: DC | PRN

## 2022-11-29 MED ORDER — CHLORHEXIDINE GLUCONATE 0.12 % MT SOLN
OROMUCOSAL | Status: AC
  Administered 2022-11-29: 15 mL via OROMUCOSAL
  Filled 2022-11-29: qty 15

## 2022-11-29 MED ORDER — KETOROLAC TROMETHAMINE 30 MG/ML IJ SOLN
INTRAMUSCULAR | Status: DC | PRN
  Administered 2022-11-29: 30 mg via INTRAVENOUS

## 2022-11-29 MED ORDER — LACTATED RINGERS IV SOLN: INTRAVENOUS | Status: DC

## 2022-11-29 MED ORDER — 0.9 % SODIUM CHLORIDE (POUR BTL) OPTIME
TOPICAL | Status: DC | PRN
  Administered 2022-11-29: 1000 mL

## 2022-11-29 MED ORDER — ONDANSETRON HCL 4 MG/2ML IJ SOLN
INTRAMUSCULAR | Status: DC | PRN
  Administered 2022-11-29: 4 mg via INTRAVENOUS

## 2022-11-29 MED ORDER — FENTANYL CITRATE (PF) 100 MCG/2ML IJ SOLN: 25.0000 ug | INTRAMUSCULAR | Status: DC | PRN

## 2022-11-29 MED ORDER — ROCURONIUM BROMIDE 10 MG/ML (PF) SYRINGE
PREFILLED_SYRINGE | INTRAVENOUS | Status: DC | PRN
  Administered 2022-11-29: 60 mg via INTRAVENOUS

## 2022-11-29 MED ORDER — THROMBIN 5000 UNITS EX SOLR
CUTANEOUS | Status: AC
  Filled 2022-11-29: qty 5000

## 2022-11-29 MED ORDER — OXYCODONE HCL 5 MG PO TABS: 5.0000 mg | ORAL_TABLET | Freq: Once | ORAL | Status: DC | PRN

## 2022-11-29 MED ORDER — PHENYLEPHRINE HCL-NACL 20-0.9 MG/250ML-% IV SOLN
INTRAVENOUS | Status: DC | PRN
  Administered 2022-11-29: 30 ug/min via INTRAVENOUS

## 2022-11-29 MED ORDER — DEXAMETHASONE SODIUM PHOSPHATE 10 MG/ML IJ SOLN
INTRAMUSCULAR | Status: DC | PRN
  Administered 2022-11-29: 10 mg via INTRAVENOUS

## 2022-11-29 MED ORDER — CELECOXIB 200 MG PO CAPS: 200.0000 mg | ORAL_CAPSULE | Freq: Once | ORAL | Status: AC

## 2022-11-29 MED ORDER — THROMBIN 5000 UNITS EX SOLR
OROMUCOSAL | Status: DC | PRN
  Administered 2022-11-29: 5 mL

## 2022-11-29 MED ORDER — MUPIROCIN 2 % EX OINT
TOPICAL_OINTMENT | CUTANEOUS | Status: AC
  Filled 2022-11-29: qty 22

## 2022-11-29 MED ORDER — SUGAMMADEX SODIUM 200 MG/2ML IV SOLN
INTRAVENOUS | Status: DC | PRN
  Administered 2022-11-29: 160 mg via INTRAVENOUS

## 2022-11-29 MED ORDER — PROPOFOL 10 MG/ML IV BOLUS
INTRAVENOUS | Status: AC
  Filled 2022-11-29: qty 20

## 2022-11-29 MED ORDER — PROPOFOL 10 MG/ML IV BOLUS
INTRAVENOUS | Status: DC | PRN
  Administered 2022-11-29: 20 mg via INTRAVENOUS
  Administered 2022-11-29: 130 mg via INTRAVENOUS

## 2022-11-29 MED ORDER — ACETAMINOPHEN 500 MG PO TABS
ORAL_TABLET | ORAL | Status: AC
  Administered 2022-11-29: 1000 mg via ORAL
  Filled 2022-11-29: qty 2

## 2022-11-29 MED ORDER — OXYCODONE HCL 5 MG/5ML PO SOLN: 5.0000 mg | Freq: Once | ORAL | Status: DC | PRN

## 2022-11-29 MED ORDER — CHLORHEXIDINE GLUCONATE 0.12 % MT SOLN: 15.0000 mL | Freq: Once | OROMUCOSAL | Status: AC

## 2022-11-29 MED ORDER — BUPIVACAINE HCL (PF) 0.25 % IJ SOLN
INTRAMUSCULAR | Status: AC
  Filled 2022-11-29: qty 30

## 2022-11-29 SURGICAL SUPPLY — 42 items
APL SKNCLS STERI-STRIP NONHPOA (GAUZE/BANDAGES/DRESSINGS) ×1
BAG COUNTER SPONGE SURGICOUNT (BAG) ×1 IMPLANT
BAG SPNG CNTER NS LX DISP (BAG) ×1
BAND INSRT 18 STRL LF DISP RB (MISCELLANEOUS) ×2
BAND RUBBER #18 3X1/16 STRL (MISCELLANEOUS) ×2 IMPLANT
BENZOIN TINCTURE PRP APPL 2/3 (GAUZE/BANDAGES/DRESSINGS) ×1 IMPLANT
BUR CARBIDE MATCH 3.0 (BURR) ×1 IMPLANT
CANISTER SUCT 3000ML PPV (MISCELLANEOUS) ×1 IMPLANT
DRAPE LAPAROTOMY 100X72X124 (DRAPES) ×1 IMPLANT
DRAPE MICROSCOPE SLANT 54X150 (MISCELLANEOUS) ×1 IMPLANT
DRAPE SURG 17X23 STRL (DRAPES) ×1 IMPLANT
DRSG OPSITE POSTOP 4X6 (GAUZE/BANDAGES/DRESSINGS) IMPLANT
DURAPREP 26ML APPLICATOR (WOUND CARE) ×1 IMPLANT
ELECT REM PT RETURN 9FT ADLT (ELECTROSURGICAL) ×1
ELECTRODE REM PT RTRN 9FT ADLT (ELECTROSURGICAL) ×1 IMPLANT
GAUZE 4X4 16PLY ~~LOC~~+RFID DBL (SPONGE) IMPLANT
GLOVE BIO SURGEON STRL SZ7 (GLOVE) IMPLANT
GLOVE BIO SURGEON STRL SZ8 (GLOVE) ×1 IMPLANT
GLOVE BIOGEL PI IND STRL 7.0 (GLOVE) IMPLANT
GOWN STRL REUS W/ TWL LRG LVL3 (GOWN DISPOSABLE) IMPLANT
GOWN STRL REUS W/ TWL XL LVL3 (GOWN DISPOSABLE) ×1 IMPLANT
GOWN STRL REUS W/TWL 2XL LVL3 (GOWN DISPOSABLE) IMPLANT
GOWN STRL REUS W/TWL LRG LVL3 (GOWN DISPOSABLE)
GOWN STRL REUS W/TWL XL LVL3 (GOWN DISPOSABLE) ×1
HEMOSTAT POWDER KIT SURGIFOAM (HEMOSTASIS) ×1 IMPLANT
KIT BASIN OR (CUSTOM PROCEDURE TRAY) ×1 IMPLANT
KIT TURNOVER KIT B (KITS) ×1 IMPLANT
NDL HYPO 25X1 1.5 SAFETY (NEEDLE) ×1 IMPLANT
NDL SPNL 20GX3.5 QUINCKE YW (NEEDLE) IMPLANT
NEEDLE HYPO 25X1 1.5 SAFETY (NEEDLE) ×1 IMPLANT
NEEDLE SPNL 20GX3.5 QUINCKE YW (NEEDLE) IMPLANT
NS IRRIG 1000ML POUR BTL (IV SOLUTION) ×1 IMPLANT
PACK LAMINECTOMY NEURO (CUSTOM PROCEDURE TRAY) ×1 IMPLANT
PAD ARMBOARD 7.5X6 YLW CONV (MISCELLANEOUS) ×3 IMPLANT
STRIP CLOSURE SKIN 1/2X4 (GAUZE/BANDAGES/DRESSINGS) ×1 IMPLANT
SUT VIC AB 0 CT1 18XCR BRD8 (SUTURE) ×1 IMPLANT
SUT VIC AB 0 CT1 8-18 (SUTURE) ×1
SUT VIC AB 2-0 CP2 18 (SUTURE) ×1 IMPLANT
SUT VIC AB 3-0 SH 8-18 (SUTURE) ×1 IMPLANT
TOWEL GREEN STERILE (TOWEL DISPOSABLE) ×1 IMPLANT
TOWEL GREEN STERILE FF (TOWEL DISPOSABLE) ×1 IMPLANT
WATER STERILE IRR 1000ML POUR (IV SOLUTION) ×1 IMPLANT

## 2022-11-29 NOTE — Op Note (Signed)
11/29/2022  3:36 PM  PATIENT:  Jonathan Beasley  72 y.o. male  PRE-OPERATIVE DIAGNOSIS: Large L4-5 synovial cyst on the left with left L5 radiculopathy  POST-OPERATIVE DIAGNOSIS:  same  PROCEDURE: Left L4-5 hemilaminectomy medial facetectomy and foraminotomies with resection of large synovial cyst utilizing microscopic dissection  SURGEON:  Sherley Bounds, MD  ASSISTANTS: Glenford Peers FNP  ANESTHESIA:   General  EBL: 75 ml  Total I/O In: 1000 [I.V.:1000] Out: 75 [Blood:75]  BLOOD ADMINISTERED: none  DRAINS: None  SPECIMEN:  none  INDICATION FOR PROCEDURE: This patient presented with left leg pain. Imaging showed large bilateral synovial cyst at L4-5 and a lytic spondylolisthesis L5-S1. The patient tried conservative measures without relief. Pain was debilitating. Recommended a L4-5 hemilaminectomy with resection of the synovial cyst. Patient understood the risks, benefits, and alternatives and potential outcomes and wished to proceed.  PROCEDURE DETAILS: The patient was taken to the operating room and after induction of adequate generalized endotracheal anesthesia, the patient was rolled into the prone position on the Wilson frame and all pressure points were padded. The lumbar region was cleaned and then prepped with DuraPrep and draped in the usual sterile fashion. 5 cc of local anesthesia was injected and then a dorsal midline incision was made and carried down to the lumbo sacral fascia. The fascia was opened and the paraspinous musculature was taken down in a subperiosteal fashion to expose L4-5 on the left. Intraoperative x-ray confirmed my level, and then I used a combination of the high-speed drill and the Kerrison punches to perform a hemilaminectomy, medial facetectomy, and foraminotomy at L4-5 on the left. The underlying yellow ligament was opened and removed in a piecemeal fashion to expose the underlying dura and exiting nerve root. I undercut the lateral recess and  dissected down until I was medial to and distal to the pedicle. The nerve root was well decompressed.  There was a large synovial cyst on the left that was removed in pieces.  It was quite cheesy and suckable.  We peeled it away from the lateral edge of the dura and from the facet and removed it from distal to proximal.  The operating microscope was draped and brought into the field for microdissection to peel the capsule away from the dura.  At no time did we see an unintended durotomy or CSF.  We then gently retracted the nerve root medially with a retractor, coagulated the epidural venous vasculature, and inspected the disc space.  There was no disc herniation. I then palpated with a coronary dilator along the nerve root and into the foramen to assure adequate decompression. I felt no more compression of the nerve root. I irrigated with saline solution containing bacitracin. Achieved hemostasis with bipolar cautery, lined the dura with Gelfoam, and then closed the fascia with 0 Vicryl. I closed the subcutaneous tissues with 2-0 Vicryl and the subcuticular tissues with 3-0 Vicryl. The skin was then closed with benzoin and Steri-Strips. The drapes were removed, a sterile dressing was applied.  My nurse practitioner was involved in the exposure, safe retraction of the neural elements, the section of the synovial cyst and the closure. the patient was awakened from general anesthesia and transferred to the recovery room in stable condition. At the end of the procedure all sponge, needle and instrument counts were correct.    PLAN OF CARE: Discharge to home after PACU  PATIENT DISPOSITION:  PACU - hemodynamically stable.   Delay start of Pharmacological VTE agent (>24hrs) due  to surgical blood loss or risk of bleeding:  yes

## 2022-11-29 NOTE — H&P (Signed)
Subjective: Patient is a 72 y.o. male admitted for leg pain. Onset of symptoms was several months ago, gradually worsening since that time.  The pain is rated moderate, and is located at the across the lower back and radiates to LLE. The pain is described as aching and occurs all day. The symptoms have been progressive. Symptoms are exacerbated by exercise and standing. MRI or CT showed large synovial cyst with stenosis L4-5   Past Medical History:  Diagnosis Date   Achilles tendonitis 07/26/2013   Allergy    Arthritis    Dysrhythmia    A-fib (onset 11/17/22)   Hyperlipidemia    Hypertension    Loose body in knee 06/02/2010   Mallet finger 08/03/2011   Restless legs    Tear of right hamstring 03/01/2017    Past Surgical History:  Procedure Laterality Date   KNEE ARTHROSCOPY Right 2013    Prior to Admission medications   Medication Sig Start Date End Date Taking? Authorizing Provider  Ascorbic Acid (VITAMIN C) 500 MG CHEW Chew 250 mg by mouth daily.   Yes [provider]  Coenzyme Q10 (CO Q-10) 200 MG CAPS Take 200 mg by mouth at bedtime.   Yes [provider]  cyclobenzaprine (FLEXERIL) 10 MG tablet Take 1 tablet (10 mg total) by mouth 3 (three) times daily as needed. Patient taking differently: Take 10 mg by mouth 2 (two) times daily as needed for muscle spasms. 11/02/22  Yes Vivi Barrack, MD  gabapentin (NEURONTIN) 100 MG capsule 1-2 AT BEDTIME AS NEEDED. Patient taking differently: Take 100 mg by mouth at bedtime as needed (RLS). 11/02/22  Yes Vivi Barrack, MD  gabapentin (NEURONTIN) 300 MG capsule Take 2 capsules (600 mg total) by mouth 3 (three) times daily. Patient taking differently: Take 300-600 mg by mouth at bedtime. 09/29/22  Yes Stefanie Libel, MD  hydrochlorothiazide (HYDRODIURIL) 25 MG tablet Take 1 tablet (25 mg total) by mouth daily. 11/22/22  Yes Vivi Barrack, MD  HYDROcodone-acetaminophen (NORCO) 5-325 MG tablet Take 1 tablet by mouth every 6  (six) hours as needed for moderate pain. 11/22/22  Yes Vivi Barrack, MD  Inositol Niacinate (NIACIN FLUSH FREE) 500 MG CAPS Take 500 mg by mouth at bedtime.   Yes [provider]  Multiple Vitamin (MULTIVITAMIN) tablet Take 1 tablet by mouth daily.   Yes [provider]  apixaban (ELIQUIS) 5 MG TABS tablet Take 1 tablet (5 mg total) by mouth 2 (two) times daily. 11/17/22   Almyra Deforest, PA  aspirin EC 81 MG tablet Take 81 mg by mouth daily. Swallow whole.    [provider]   Allergies  Allergen Reactions   Penicillins Other (See Comments)    Childhood- "deathly ill"   Shrimp (Diagnostic) Rash    Face turns red    Social History   Tobacco Use   Smoking status: Never   Smokeless tobacco: Never  Substance Use Topics   Alcohol use: No    Family History  Problem Relation Age of Onset   Heart disease Father    Heart disease Maternal Grandfather    Heart disease Paternal Grandfather      Review of Systems  Positive ROS: neg  All other systems have been reviewed and were otherwise negative with the exception of those mentioned in the HPI and as above.  Objective: Vital signs in last 24 hours: Temp:  [97.6 F (36.4 C)] 97.6 F (36.4 C) (01/15 1208) Pulse Rate:  [77]  77 (01/15 1208) Resp:  [18] 18 (01/15 1208) BP: (163)/(92) 163/92 (01/15 1208) SpO2:  [98 %] 98 % (01/15 1208) Weight:  [81.2 kg] 81.2 kg (01/15 1208)  General Appearance: Alert, cooperative, no distress, appears stated age Head: Normocephalic, without obvious abnormality, atraumatic Eyes: PERRL, conjunctiva/corneas clear, EOM's intact    Neck: Supple, symmetrical, trachea midline Back: Symmetric, no curvature, ROM normal, no CVA tenderness Lungs:  respirations unlabored Heart: Regular rate and rhythm Abdomen: Soft, non-tender Extremities: Extremities normal, atraumatic, no cyanosis or edema Pulses: 2+ and symmetric all extremities Skin: Skin color, texture, turgor normal, no rashes or  lesions  NEUROLOGIC:   Mental status: Alert and oriented x4,  no aphasia, good attention span, fund of knowledge, and memory Motor Exam - grossly normal Sensory Exam - grossly normal Reflexes: 1+ Coordination - grossly normal Gait - grossly normal Balance - grossly normal Cranial Nerves: I: smell Not tested  II: visual acuity  OS: nl    OD: nl  II: visual fields Full to confrontation  II: pupils Equal, round, reactive to light  III,VII: ptosis None  III,IV,VI: extraocular muscles  Full ROM  V: mastication Normal  V: facial light touch sensation  Normal  V,VII: corneal reflex  Present  VII: facial muscle function - upper  Normal  VII: facial muscle function - lower Normal  VIII: hearing Not tested  IX: soft palate elevation  Normal  IX,X: gag reflex Present  XI: trapezius strength  5/5  XI: sternocleidomastoid strength 5/5  XI: neck flexion strength  5/5  XII: tongue strength  Normal    Data Review Lab Results  Component Value Date   WBC 4.8 11/02/2022   HGB 17.0 11/02/2022   HCT 49.4 11/02/2022   MCV 91.6 11/02/2022   PLT 228.0 11/02/2022   Lab Results  Component Value Date   NA 138 11/02/2022   K 4.2 11/02/2022   CL 102 11/02/2022   CO2 27 11/02/2022   BUN 25 (H) 11/02/2022   CREATININE 1.07 11/02/2022   GLUCOSE 97 11/02/2022   Lab Results  Component Value Date   INR 1.3 (H) 11/17/2022    Assessment/Plan:  Estimated body mass index is 24.97 kg/m as calculated from the following:   Height as of this encounter: '5\' 11"'$  (1.803 m).   Weight as of this encounter: 81.2 kg. Patient admitted for LL for synovial cyst. Patient has failed a reasonable attempt at conservative therapy.  I explained the condition and procedure to the patient and answered any questions.  Patient wishes to proceed with procedure as planned. Understands risks/ benefits and typical outcomes of procedure.   Jonathan Beasley 11/29/2022 12:45 PM

## 2022-11-29 NOTE — Discharge Instructions (Addendum)
Start eliquis back on 12/04/2022

## 2022-11-29 NOTE — Transfer of Care (Signed)
Immediate Anesthesia Transfer of Care Note  Patient: Jonathan Beasley  Procedure(s) Performed: Laminectomy for facet/synovial cyst - L4-L5 - left (Left: Back)  Patient Location: PACU  Anesthesia Type:General  Level of Consciousness: drowsy and patient cooperative  Airway & Oxygen Therapy: Patient Spontanous Breathing and Patient connected to nasal cannula oxygen  Post-op Assessment: Report given to RN, Post -op Vital signs reviewed and stable, and Patient moving all extremities  Post vital signs: Reviewed and stable  Last Vitals:  Vitals Value Taken Time  BP 148/96 11/29/22 1545  Temp    Pulse 72 11/29/22 1545  Resp 17 11/29/22 1545  SpO2 98 % 11/29/22 1545  Vitals shown include unvalidated device data.  Last Pain:  Vitals:   11/29/22 1222  TempSrc:   PainSc: 0-No pain         Complications: No notable events documented.

## 2022-11-29 NOTE — Anesthesia Procedure Notes (Signed)
Procedure Name: Intubation Date/Time: 11/29/2022 1:59 PM  Performed by: Griffin Dakin, CRNAPre-anesthesia Checklist: Patient identified, Emergency Drugs available, Suction available and Patient being monitored Patient Re-evaluated:Patient Re-evaluated prior to induction Oxygen Delivery Method: Circle system utilized Preoxygenation: Pre-oxygenation with 100% oxygen Induction Type: IV induction Ventilation: Mask ventilation without difficulty and Oral airway inserted - appropriate to patient size Laryngoscope Size: Mac and 4 Grade View: Grade II Tube type: Oral Tube size: 7.5 mm Number of attempts: 1 Airway Equipment and Method: Stylet and Oral airway Placement Confirmation: ETT inserted through vocal cords under direct vision, positive ETCO2 and breath sounds checked- equal and bilateral Secured at: 23 cm Tube secured with: Tape Dental Injury: Teeth and Oropharynx as per pre-operative assessment

## 2022-11-30 ENCOUNTER — Encounter (HOSPITAL_COMMUNITY): Payer: Self-pay | Admitting: Neurological Surgery

## 2022-11-30 NOTE — Anesthesia Postprocedure Evaluation (Signed)
Anesthesia Post Note  Patient: Jonathan Beasley  Procedure(s) Performed: Laminectomy for facet/synovial cyst - L4-L5 - left (Left: Back)     Patient location during evaluation: PACU Anesthesia Type: General Level of consciousness: awake and alert Pain management: pain level controlled Vital Signs Assessment: post-procedure vital signs reviewed and stable Respiratory status: spontaneous breathing, nonlabored ventilation, respiratory function stable and patient connected to nasal cannula oxygen Cardiovascular status: blood pressure returned to baseline and stable Postop Assessment: no apparent nausea or vomiting Anesthetic complications: no   No notable events documented.  Last Vitals:  Vitals:   11/29/22 1630 11/29/22 1640  BP: (!) 131/96   Pulse: 70   Resp: 19   Temp:  36.6 C  SpO2: 95%     Last Pain:  Vitals:   11/29/22 1615  TempSrc:   PainSc: 3                  Zaivion Kundrat P Cosimo Schertzer

## 2022-12-10 ENCOUNTER — Telehealth (INDEPENDENT_AMBULATORY_CARE_PROVIDER_SITE_OTHER): Payer: Medicare Other | Admitting: Family Medicine

## 2022-12-10 ENCOUNTER — Encounter: Payer: Self-pay | Admitting: Family Medicine

## 2022-12-10 VITALS — Ht 71.0 in | Wt 179.0 lb

## 2022-12-10 DIAGNOSIS — E782 Mixed hyperlipidemia: Secondary | ICD-10-CM

## 2022-12-10 DIAGNOSIS — I10 Essential (primary) hypertension: Secondary | ICD-10-CM | POA: Diagnosis not present

## 2022-12-10 DIAGNOSIS — M47816 Spondylosis without myelopathy or radiculopathy, lumbar region: Secondary | ICD-10-CM | POA: Diagnosis not present

## 2022-12-10 DIAGNOSIS — I4891 Unspecified atrial fibrillation: Secondary | ICD-10-CM | POA: Diagnosis not present

## 2022-12-10 MED ORDER — LOSARTAN POTASSIUM 50 MG PO TABS: 50.0000 mg | ORAL_TABLET | Freq: Every day | ORAL | 3 refills | Status: DC

## 2022-12-10 NOTE — Assessment & Plan Note (Signed)
He is working on lifestyle modifications and would like to avoid medications. HAs upcoming coronary scan.

## 2022-12-10 NOTE — Assessment & Plan Note (Signed)
Patient increased his dose of HCTZ to '25mg'$  twice daily. Discussed with patient ideally would go back to '25mg'$  daily due to concern for electrolyte abnormalities. We discussed additional antihypertensives. Will send in losartan '50mg'$  daily. He can take this is with HCTZ '25mg'$  daily. He will monitor at home and check in with [place] in a week or two via mychart. Will need to repeat BMET in a few weeks.

## 2022-12-10 NOTE — Progress Notes (Signed)
   Jonathan Beasley is a 72 y.o. male who presents today for a virtual office visit.  Assessment/Plan:  Chronic Problems Addressed Today: HLD (hyperlipidemia) He is working on lifestyle modifications and would like to avoid medications. HAs upcoming coronary scan.   Atrial fibrillation ([place]) Continue management per cardiology. He is prescribed eliquis '5mg'$  twice daily.   Essential hypertension Patient increased his dose of HCTZ to '25mg'$  twice daily. Discussed with patient ideally would go back to '25mg'$  daily due to concern for electrolyte abnormalities. We discussed additional antihypertensives. Will send in losartan '50mg'$  daily. He can take this is with HCTZ '25mg'$  daily. He will monitor at home and check in with [place] in a week or two via mychart. Will need to repeat BMET in a few weeks.   Lumbar spondylosis Recently had laminectomy due to synovial cyst.  Did well postoperatively.  His neurosurgeon told him that he may notice a shift in his pain due to relieving pressure with the removal of the synovial cyst.  He does still have some pain in his back in a slightly different location.  His neurosurgeon has given him some postoperative Norco though he does think he may need to have some ongoing going forward.  He will follow-up with his neurosurgeon as planned and let me know if he needs refill going forward.  He is reluctant to get another back surgery at this point though does understand that long-term narcotic use is not ideal either.  Medications do help with his pain level.  He has been tolerating well without significant side effects.     Subjective:  HPI:  See A/p for status of chronic conditions. He is here today for follow up. We saw him a couple of weeks ago. Since our last visit he underwent laminectomy at L4-L5 for a synovial cyst. HE did well post operatively but has noticed that he has had some shift in his pain to the other wide of back.   He also saw cardiology since our last visit.  They had recommended long term anticoagulation with eliquis '5mg'$  twice daily.   He has ongoing issues with elevated blood pressure reading and he double up on the dose of his HCTZ to '25mg'$  twice daily. Readings the last few days have been much better controlled with systolics in the 419Q.        Objective/Observations  Physical Exam: Gen: NAD, resting comfortably Pulm: Normal work of breathing Neuro: Grossly normal, moves all extremities Psych: Normal affect and thought content  Virtual Visit via Video   I connected with Jonathan Beasley on 12/10/22 at  2:00 PM EST by a video enabled telemedicine application and verified that I am speaking with the correct person using two identifiers. The limitations of evaluation and management by telemedicine and the availability of in person appointments were discussed. The patient expressed understanding and agreed to proceed.   Patient location: Home Provider location: [place] participating in the virtual visit: Myself and Patient     Algis Greenhouse. Jerline Pain, MD 12/10/2022 2:43 PM

## 2022-12-10 NOTE — Assessment & Plan Note (Signed)
Recently had laminectomy due to synovial cyst.  Did well postoperatively.  His neurosurgeon told him that he may notice a shift in his pain due to relieving pressure with the removal of the synovial cyst.  He does still have some pain in his back in a slightly different location.  His neurosurgeon has given him some postoperative Norco though he does think he may need to have some ongoing going forward.  He will follow-up with his neurosurgeon as planned and let me know if he needs refill going forward.  He is reluctant to get another back surgery at this point though does understand that long-term narcotic use is not ideal either.  Medications do help with his pain level.  He has been tolerating well without significant side effects.

## 2022-12-10 NOTE — Assessment & Plan Note (Signed)
Continue management per cardiology. He is prescribed eliquis '5mg'$  twice daily.

## 2022-12-14 ENCOUNTER — Other Ambulatory Visit (HOSPITAL_COMMUNITY): Payer: Medicare Other

## 2022-12-20 ENCOUNTER — Telehealth (HOSPITAL_COMMUNITY): Payer: Self-pay | Admitting: *Deleted

## 2022-12-20 ENCOUNTER — Encounter: Payer: Self-pay | Admitting: Family Medicine

## 2022-12-20 NOTE — Telephone Encounter (Signed)
-----   Message from Claretha Cooper sent at 12/20/2022 10:03 AM EST ----- Regarding: RE: appt per dr croitoru I have called twice and left a voicemail   ----- Message ----- From: Juluis Mire, RN Sent: 12/17/2022  11:13 AM EST To: Claretha Cooper Subject: RE: appt per dr croitoru                       Yes didn't see where an appt had been made yet just following up :) ----- Message ----- From: Claretha Cooper Sent: 12/17/2022  11:08 AM EST To: Juluis Mire, RN Subject: RE: appt per dr croitoru                       Is this a f/u ? ----- Message ----- From: Juluis Mire, RN Sent: 12/17/2022  11:05 AM EST To: Claretha Cooper Subject: FW: appt per dr croitoru                        ----- Message ----- From: Juluis Mire, RN Sent: 12/15/2022   2:08 PM EST To: Claretha Cooper Subject: appt per dr croitoru                           Pt needs appt in 10-14 days per dr c. Thanks Shalonda Sachse

## 2022-12-21 ENCOUNTER — Other Ambulatory Visit (HOSPITAL_BASED_OUTPATIENT_CLINIC_OR_DEPARTMENT_OTHER): Payer: Self-pay

## 2022-12-21 MED ORDER — HYDROCODONE-ACETAMINOPHEN 5-325 MG PO TABS
1.0000 | ORAL_TABLET | Freq: Three times a day (TID) | ORAL | 0 refills | Status: DC | PRN
  Filled 2022-12-21: qty 90, 30d supply, fill #0

## 2022-12-23 ENCOUNTER — Other Ambulatory Visit: Payer: Self-pay | Admitting: Family Medicine

## 2023-01-13 ENCOUNTER — Encounter: Payer: Self-pay | Admitting: Radiology

## 2023-01-13 ENCOUNTER — Other Ambulatory Visit: Payer: Self-pay | Admitting: Family Medicine

## 2023-01-13 ENCOUNTER — Encounter: Payer: Self-pay | Admitting: Family Medicine

## 2023-01-13 ENCOUNTER — Other Ambulatory Visit (HOSPITAL_BASED_OUTPATIENT_CLINIC_OR_DEPARTMENT_OTHER): Payer: Self-pay

## 2023-01-14 ENCOUNTER — Other Ambulatory Visit (HOSPITAL_BASED_OUTPATIENT_CLINIC_OR_DEPARTMENT_OTHER): Payer: Self-pay

## 2023-01-14 MED ORDER — HYDROCODONE-ACETAMINOPHEN 5-325 MG PO TABS
1.0000 | ORAL_TABLET | Freq: Three times a day (TID) | ORAL | 0 refills | Status: DC | PRN
  Filled 2023-01-14: qty 90, 30d supply, fill #0

## 2023-01-14 NOTE — Telephone Encounter (Signed)
Last refill: 12/21/22 #90,0 Last OV: 12/10/22 dx. Hyperlipidemia

## 2023-01-14 NOTE — Telephone Encounter (Signed)
I appreciate the update.  We will refill his medications.  It is okay for him to continue with current medications for now and he can continue to monitor his blood pressure.  We can repeat blood work in 3 to 4 months

## 2023-01-14 NOTE — Telephone Encounter (Signed)
See note

## 2023-01-15 ENCOUNTER — Other Ambulatory Visit (HOSPITAL_BASED_OUTPATIENT_CLINIC_OR_DEPARTMENT_OTHER): Payer: Self-pay

## 2023-01-18 ENCOUNTER — Other Ambulatory Visit (HOSPITAL_COMMUNITY): Payer: Self-pay

## 2023-01-18 ENCOUNTER — Other Ambulatory Visit (HOSPITAL_BASED_OUTPATIENT_CLINIC_OR_DEPARTMENT_OTHER): Payer: Self-pay

## 2023-01-19 ENCOUNTER — Telehealth: Payer: Self-pay | Admitting: Family Medicine

## 2023-01-19 NOTE — Telephone Encounter (Signed)
Copied from [place] [id number]. Topic: Medicare AWV >> Jan 19, 2023 11:36 AM Gillis Santa wrote: Reason for CRM: Called patient to schedule Medicare Annual Wellness Visit (AWV). Left message for patient to call back and schedule Medicare Annual Wellness Visit (AWV).  Last date of AWV: n/a  Please schedule an appointment at any time with Otila Kluver, [facility].  If any questions, please contact me at [phone].  Thank you ,  Shaune Pollack [facility] AWV TEAM Direct Dial [phone]

## 2023-02-09 ENCOUNTER — Telehealth: Payer: Self-pay | Admitting: Family Medicine

## 2023-02-09 NOTE — Telephone Encounter (Signed)
Copied from [place] [phone]. Topic: Medicare AWV >> Feb 09, 2023 10:17 AM Gillis Santa wrote: Reason for CRM: Called patient to schedule Medicare Annual Wellness Visit (AWV). Left message for patient to call back and schedule Medicare Annual Wellness Visit (AWV).  Last date of AWV: N/A  Please schedule an appointment at any time with Otila Kluver, [facility]. Please schedule AWVS with Otila Kluver, [place].  If any questions, please contact me at [phone].  Thank you ,  Shaune Pollack [facility] AWV TEAM Direct Dial [phone]

## 2023-02-11 ENCOUNTER — Telehealth: Payer: Self-pay | Admitting: Emergency Medicine

## 2023-02-11 NOTE — Telephone Encounter (Signed)
Called pt to set up a follow up appt.; no answer, left message and number; will send mychart message

## 2023-02-16 ENCOUNTER — Other Ambulatory Visit: Payer: Self-pay | Admitting: Family Medicine

## 2023-02-16 ENCOUNTER — Telehealth: Payer: Self-pay | Admitting: Family Medicine

## 2023-02-16 NOTE — Telephone Encounter (Signed)
Copied from [place] [id number]. Topic: Medicare AWV >> Feb 16, 2023  9:32 AM Gillis Santa wrote: Reason for CRM: Called patient to schedule Medicare Annual Wellness Visit (AWV). Left message for patient to call back and schedule Medicare Annual Wellness Visit (AWV).  Last date of AWV: N/A  Please schedule an appointment at any time with Otila Kluver, [facility]. Please schedule AWVS with Otila Kluver, [place]..  If any questions, please contact me at [phone].  Thank you ,  Shaune Pollack [facility] AWV TEAM Direct Dial [phone]

## 2023-02-17 ENCOUNTER — Other Ambulatory Visit (HOSPITAL_BASED_OUTPATIENT_CLINIC_OR_DEPARTMENT_OTHER): Payer: Self-pay

## 2023-02-17 MED ORDER — HYDROCODONE-ACETAMINOPHEN 5-325 MG PO TABS
1.0000 | ORAL_TABLET | Freq: Three times a day (TID) | ORAL | 0 refills | Status: DC | PRN
  Filled 2023-02-17: qty 90, 30d supply, fill #0

## 2023-02-17 NOTE — Telephone Encounter (Signed)
Last refill: 01/14/23 #90, 0 Last OV: 12/10/22 dx. Hyperlipidemia, hosp f/u

## 2023-03-18 ENCOUNTER — Encounter: Payer: Self-pay | Admitting: Family Medicine

## 2023-03-18 ENCOUNTER — Telehealth (INDEPENDENT_AMBULATORY_CARE_PROVIDER_SITE_OTHER): Payer: Medicare Other | Admitting: Family Medicine

## 2023-03-18 ENCOUNTER — Other Ambulatory Visit: Payer: Self-pay | Admitting: Family Medicine

## 2023-03-18 ENCOUNTER — Other Ambulatory Visit (HOSPITAL_BASED_OUTPATIENT_CLINIC_OR_DEPARTMENT_OTHER): Payer: Self-pay

## 2023-03-18 VITALS — Ht 71.0 in | Wt 180.0 lb

## 2023-03-18 DIAGNOSIS — I4891 Unspecified atrial fibrillation: Secondary | ICD-10-CM

## 2023-03-18 DIAGNOSIS — I10 Essential (primary) hypertension: Secondary | ICD-10-CM | POA: Diagnosis not present

## 2023-03-18 DIAGNOSIS — G2581 Restless legs syndrome: Secondary | ICD-10-CM | POA: Diagnosis not present

## 2023-03-18 DIAGNOSIS — M47816 Spondylosis without myelopathy or radiculopathy, lumbar region: Secondary | ICD-10-CM | POA: Diagnosis not present

## 2023-03-18 MED ORDER — HYDROCODONE-ACETAMINOPHEN 5-325 MG PO TABS
1.0000 | ORAL_TABLET | Freq: Three times a day (TID) | ORAL | 0 refills | Status: DC | PRN
  Filled 2023-03-18: qty 90, 30d supply, fill #0

## 2023-03-18 NOTE — Progress Notes (Signed)
   Jonathan Beasley is a 72 y.o. male who presents today for a virtual office visit.  Assessment/Plan:  Chronic Problems Addressed Today: Lumbar spondylosis Following with neurosurgery for this. Is doing well overall from his recent left sided laminectomy surgery though does still have persistent right sided symptoms. Will refill his norco today. He has been tolerating well. Medications help with his pain and ability to perform activities of daily living. Database without any redflags. May be having another surgery later this year. He is also on gabapentin 300-600mg  nightly which works well.   Restless legs Stable on on gabapentin 300-g00mg  nightly. Does not refill today.  He will let us know when he needs refill.   Essential hypertension Home readings have been at goal on HCTZ 25mg  daily and losartan 50mg . He will continue this.  He will let us know if persistently elevated at home.     Subjective:  HPI:  See A/p for status of chronic conditions.  He is here today for follow up. We last saw him about 3 months ago. At that the time he was recovering from his left sided lumbar laminectomy. We continued him on norco at that time.  Overall his left-sided symptoms do seem to be improving though still does have some persistent right-sided symptoms.  Apparently his neurosurgeon told him that this is to be expected and then that he may need to have another surgery later this year to help with his right-sided symptoms.  He is currently on Norco.  Tolerates well.  No significant side effects.  Pain is managed reasonably well on the Norco.  Helps him perform activities of daily living.  He has been monitoring his blood pressure. He has had occasional reading that have been elevated however home readings are typically in the 120s-130s/80s.   Since our last visit he has had some issues with a dental abscess.  This seems to be improving now.       Objective/Observations  Physical Exam: Gen: NAD,  resting comfortably Pulm: Normal work of breathing Neuro: Grossly normal, moves all extremities Psych: Normal affect and thought content  Virtual Visit via Video   I connected with Dempsey Mcsween on 03/18/23 at  2:00 PM EDT by a video enabled telemedicine application and verified that I am speaking with the correct person using two identifiers. The limitations of evaluation and management by telemedicine and the availability of in person appointments were discussed. The patient expressed understanding and agreed to proceed.   Patient location: Home Provider location: [place] [employer] Persons participating in the virtual visit: Myself and Patient     Katina Degree. [name], MD 03/18/2023 2:44 PM

## 2023-03-18 NOTE — Assessment & Plan Note (Addendum)
Stable on on gabapentin 300-g00mg  nightly. Does not refill today.  He will let us know when he needs refill.

## 2023-03-18 NOTE — Assessment & Plan Note (Signed)
Home readings have been at goal on HCTZ 25mg  daily and losartan 50mg . He will continue this.  He will let us know if persistently elevated at home.

## 2023-03-18 NOTE — Assessment & Plan Note (Addendum)
Following with neurosurgery for this. Is doing well overall from his recent left sided laminectomy surgery though does still have persistent right sided symptoms. Will refill his norco today. He has been tolerating well. Medications help with his pain and ability to perform activities of daily living. Database without any redflags. May be having another surgery later this year. He is also on gabapentin 300-600mg  nightly which works well.

## 2023-03-20 ENCOUNTER — Other Ambulatory Visit: Payer: Self-pay | Admitting: Family Medicine

## 2023-03-20 DIAGNOSIS — M5416 Radiculopathy, lumbar region: Secondary | ICD-10-CM

## 2023-03-29 ENCOUNTER — Telehealth: Payer: Self-pay | Admitting: Family Medicine

## 2023-03-29 NOTE — Telephone Encounter (Signed)
Copied from CRM [id number]. Topic: Medicare AWV >> Mar 29, 2023 10:19 AM Gwenith Spitz wrote: Reason for CRM: Called patient to schedule Medicare Annual Wellness Visit (AWV). Left message for patient to call back and schedule Medicare Annual Wellness Visit (AWV).  Last date of AWV: N/A  Please schedule an appointment at any time with Inetta Fermo, [facility]. Please schedule AWVI with Inetta Fermo, [facility] [place].  If any questions, please contact me at [phone].  Thank you ,  Gabriel Cirri [facility] AWV TEAM Direct Dial [phone]

## 2023-04-13 ENCOUNTER — Other Ambulatory Visit: Payer: Self-pay | Admitting: Family Medicine

## 2023-04-14 ENCOUNTER — Encounter: Payer: Self-pay | Admitting: Family Medicine

## 2023-04-14 ENCOUNTER — Telehealth (INDEPENDENT_AMBULATORY_CARE_PROVIDER_SITE_OTHER): Payer: Medicare Other | Admitting: Family Medicine

## 2023-04-14 ENCOUNTER — Other Ambulatory Visit (HOSPITAL_BASED_OUTPATIENT_CLINIC_OR_DEPARTMENT_OTHER): Payer: Self-pay

## 2023-04-14 VITALS — Ht 71.0 in | Wt 175.0 lb

## 2023-04-14 DIAGNOSIS — M47816 Spondylosis without myelopathy or radiculopathy, lumbar region: Secondary | ICD-10-CM | POA: Diagnosis not present

## 2023-04-14 DIAGNOSIS — M5416 Radiculopathy, lumbar region: Secondary | ICD-10-CM

## 2023-04-14 MED ORDER — GABAPENTIN 300 MG PO CAPS: 600.0000 mg | ORAL_CAPSULE | Freq: Three times a day (TID) | ORAL | 2 refills | Status: DC

## 2023-04-14 MED ORDER — HYDROCODONE-ACETAMINOPHEN 5-325 MG PO TABS
1.0000 | ORAL_TABLET | Freq: Four times a day (QID) | ORAL | 0 refills | Status: DC | PRN
  Filled 2023-04-14: qty 90, 23d supply, fill #0

## 2023-04-14 NOTE — Assessment & Plan Note (Signed)
Pain is not controlled. No red flags. Flared up recently likely due to increased activity related to helping his mother. He does follow with neurosurgery and his currently considering surgery later this year but he is not yet ready to commit to this. Would be reasonable to increase his dose of gabapentin at this point. Will increase gabapentin to 600mg  three times daily. Will refill norco today. His last refill was 28 days ago. Will authorize early refill today but advised patient that we should not increase his dose of narcotics beyond what he is already getting. He agreed to this plan. If pain is not controlled with above he will need to follow back up with neurosurgery for more definitive management.

## 2023-04-14 NOTE — Progress Notes (Signed)
   Jonathan Beasley is a 72 y.o. male who presents today for a virtual office visit.  Assessment/Plan:  Chronic Problems Addressed Today: Lumbar spondylosis Pain is not controlled. No red flags. Flared up recently likely due to increased activity related to helping his mother. He does follow with neurosurgery and his currently considering surgery later this year but he is not yet ready to commit to this. Would be reasonable to increase his dose of gabapentin at this point. Will increase gabapentin to 600mg  three times daily. Will refill norco today. His last refill was 28 days ago. Will authorize early refill today but advised patient that we should not increase his dose of narcotics beyond what he is already getting. He agreed to this plan. If pain is not controlled with above he will need to follow back up with neurosurgery for more definitive management.     Subjective:  HPI:  See Assessment / plan for status of chronic conditions.  Main concern today is chronic low back pain. He did have a laminectomy earlier this year with neurosurgery due to a synovial cyst. He has done reasonable well post operatively but still has pain more predominantly on the right side. Pain has flared up the last week or so due to being more active with helping his aging mother. He is currently on norco 5-325 three times daily and gabapentin 600mg  1-2 times daily. He talked with a friend who is a physician who recommended that he try to increase the dose of gabapentin to 600mg  three times daily. HE has done well at this dose in the past. He is considering repeat surgery later this year for his right sided low back pain.        Objective/Observations  Physical Exam: Gen: NAD, resting comfortably Pulm: Normal work of breathing Neuro: Grossly normal, moves all extremities Psych: Normal affect and thought content  Virtual Visit via Video   I connected with Jonathan Beasley on 04/14/23 at  7:20 AM EDT by a video  enabled telemedicine application and verified that I am speaking with the correct person using two identifiers. The limitations of evaluation and management by telemedicine and the availability of in person appointments were discussed. The patient expressed understanding and agreed to proceed.   Patient location: Home Provider location: [place] [employer] Persons participating in the virtual visit: Myself and Patient     Jonathan Beasley. Jonathan Ralph, MD 04/14/2023 7:47 AM

## 2023-04-15 ENCOUNTER — Telehealth: Payer: Medicare Other | Admitting: Family Medicine

## 2023-05-02 ENCOUNTER — Other Ambulatory Visit: Payer: Self-pay | Admitting: Family Medicine

## 2023-05-03 ENCOUNTER — Telehealth (INDEPENDENT_AMBULATORY_CARE_PROVIDER_SITE_OTHER): Payer: Medicare Other | Admitting: Family Medicine

## 2023-05-03 ENCOUNTER — Other Ambulatory Visit (HOSPITAL_BASED_OUTPATIENT_CLINIC_OR_DEPARTMENT_OTHER): Payer: Self-pay

## 2023-05-03 ENCOUNTER — Encounter: Payer: Self-pay | Admitting: Family Medicine

## 2023-05-03 VITALS — Ht 71.0 in

## 2023-05-03 DIAGNOSIS — I10 Essential (primary) hypertension: Secondary | ICD-10-CM

## 2023-05-03 DIAGNOSIS — M47816 Spondylosis without myelopathy or radiculopathy, lumbar region: Secondary | ICD-10-CM

## 2023-05-03 MED ORDER — HYDROCODONE-ACETAMINOPHEN 5-325 MG PO TABS
1.0000 | ORAL_TABLET | Freq: Four times a day (QID) | ORAL | 0 refills | Status: DC | PRN
  Filled 2023-05-03 – 2023-05-05 (×2): qty 90, 23d supply, fill #0

## 2023-05-03 NOTE — Assessment & Plan Note (Signed)
Home readings have been at goal.  He is concerned the HCTZ may be causing excessive sweating and would like to wean off this.  He has weaned down to 12.5 mg daily.  We will continue losartan 50 mg daily.  He will monitor his blood pressure at home for the next 1 to 2 weeks.  May be reasonable to wean off HCTZ completely if still having some issues with the sweating.  If blood pressure increases we can increase dose of losartan 100 mg daily.

## 2023-05-03 NOTE — Progress Notes (Signed)
   Jonathan Beasley is a 72 y.o. male who presents today for a virtual office visit.  Assessment/Plan:  Chronic Problems Addressed Today: Lumbar spondylosis Pain is overall stable.  He did have a flareup a couple weeks ago but this is improving with home exercises.  Database was reviewed.  Last refill was 18 days ago but he will be going out of town for a while to help with his mother and brother who have both had significant health issues.  He will be able to pick up prescription refill to pharmacy in 2 days.  We will send in refill for Norco today.  Essential hypertension Home readings have been at goal.  He is concerned the HCTZ may be causing excessive sweating and would like to wean off this.  He has weaned down to 12.5 mg daily.  We will continue losartan 50 mg daily.  He will monitor his blood pressure at home for the next 1 to 2 weeks.  May be reasonable to wean off HCTZ completely if still having some issues with the sweating.  If blood pressure increases we can increase dose of losartan 100 mg daily.     Subjective:  HPI:  See Assessment / plan for status of chronic conditions.  Patient last met virtual visit about 3 weeks ago. At that time was having uncontrolled lumbar back pain related to lumbar spondylosis.  We increased his gabapentin 600 mg 3 times daily and continued him on Norco. His pain was severe for the week or so after he initially saw [place]. He started doing some exercises at home and pain has been improving.  He will be traveling out of town to help his mother and brother who have been dealing with health issues. He needs a refill on his norco to have on hand while he is out of town.   He is also concerned that his hydrochlorothiazide is causing some issues with significant sweating.  He is interested in trying off his medications daily      Objective/Observations  Physical Exam: Gen: NAD, resting comfortably Pulm: Normal work of breathing Neuro: Grossly normal, moves  all extremities Psych: Normal affect and thought content  Virtual Visit via Video   I connected with Carzell Migneault on 05/03/23 at  2:00 PM EDT by a video enabled telemedicine application and verified that I am speaking with the correct person using two identifiers. The limitations of evaluation and management by telemedicine and the availability of in person appointments were discussed. The patient expressed understanding and agreed to proceed.   Patient location: Home Provider location: [place] [employer] Persons participating in the virtual visit: Myself and Patient     Katina Degree. [name], MD 05/03/2023 2:37 PM

## 2023-05-03 NOTE — Assessment & Plan Note (Addendum)
Pain is overall stable.  He did have a flareup a couple weeks ago but this is improving with home exercises.  Database was reviewed.  Last refill was 18 days ago but he will be going out of town for a while to help with his mother and brother who have both had significant health issues.  He will be able to pick up prescription refill to pharmacy in 2 days.  We will send in refill for Norco today.

## 2023-05-04 ENCOUNTER — Other Ambulatory Visit (HOSPITAL_BASED_OUTPATIENT_CLINIC_OR_DEPARTMENT_OTHER): Payer: Self-pay

## 2023-05-05 ENCOUNTER — Other Ambulatory Visit (HOSPITAL_BASED_OUTPATIENT_CLINIC_OR_DEPARTMENT_OTHER): Payer: Self-pay

## 2023-06-06 ENCOUNTER — Other Ambulatory Visit: Payer: Self-pay | Admitting: Family Medicine

## 2023-06-07 ENCOUNTER — Other Ambulatory Visit (HOSPITAL_BASED_OUTPATIENT_CLINIC_OR_DEPARTMENT_OTHER): Payer: Self-pay

## 2023-06-07 MED ORDER — HYDROCODONE-ACETAMINOPHEN 5-325 MG PO TABS
1.0000 | ORAL_TABLET | Freq: Four times a day (QID) | ORAL | 0 refills | Status: DC | PRN
  Filled 2023-06-07: qty 90, 23d supply, fill #0

## 2023-06-07 NOTE — Telephone Encounter (Signed)
LOV: 05/03/2023  Last Fill Date:  05/03/2023  Qty: 60  Refills: 0

## 2023-06-08 ENCOUNTER — Other Ambulatory Visit (HOSPITAL_BASED_OUTPATIENT_CLINIC_OR_DEPARTMENT_OTHER): Payer: Self-pay

## 2023-07-11 ENCOUNTER — Other Ambulatory Visit: Payer: Self-pay | Admitting: Family Medicine

## 2023-07-12 ENCOUNTER — Other Ambulatory Visit (HOSPITAL_BASED_OUTPATIENT_CLINIC_OR_DEPARTMENT_OTHER): Payer: Self-pay

## 2023-07-12 MED ORDER — HYDROCODONE-ACETAMINOPHEN 5-325 MG PO TABS
1.0000 | ORAL_TABLET | Freq: Four times a day (QID) | ORAL | 0 refills | Status: DC | PRN
  Filled 2023-07-12: qty 90, 23d supply, fill #0

## 2023-07-12 NOTE — Telephone Encounter (Signed)
LAST APPOINTMENT DATE: 06/06/2023   NEXT APPOINTMENT DATE: 11/02/2022

## 2023-08-07 ENCOUNTER — Other Ambulatory Visit: Payer: Self-pay | Admitting: Family Medicine

## 2023-08-07 DIAGNOSIS — M5416 Radiculopathy, lumbar region: Secondary | ICD-10-CM

## 2023-08-22 ENCOUNTER — Other Ambulatory Visit: Payer: Self-pay | Admitting: Family Medicine

## 2023-08-23 ENCOUNTER — Other Ambulatory Visit (HOSPITAL_BASED_OUTPATIENT_CLINIC_OR_DEPARTMENT_OTHER): Payer: Self-pay

## 2023-08-23 MED ORDER — HYDROCODONE-ACETAMINOPHEN 5-325 MG PO TABS
1.0000 | ORAL_TABLET | Freq: Four times a day (QID) | ORAL | 0 refills | Status: DC | PRN
  Filled 2023-08-23: qty 90, 23d supply, fill #0

## 2023-09-13 ENCOUNTER — Encounter: Payer: Self-pay | Admitting: Family Medicine

## 2023-09-15 ENCOUNTER — Other Ambulatory Visit: Payer: Self-pay | Admitting: *Deleted

## 2023-09-15 DIAGNOSIS — M5416 Radiculopathy, lumbar region: Secondary | ICD-10-CM

## 2023-09-15 MED ORDER — GABAPENTIN 300 MG PO CAPS: 600.0000 mg | ORAL_CAPSULE | Freq: Three times a day (TID) | ORAL | 2 refills | Status: DC

## 2023-09-27 ENCOUNTER — Other Ambulatory Visit: Payer: Self-pay | Admitting: Family Medicine

## 2023-09-29 ENCOUNTER — Other Ambulatory Visit (HOSPITAL_BASED_OUTPATIENT_CLINIC_OR_DEPARTMENT_OTHER): Payer: Self-pay

## 2023-09-29 MED ORDER — HYDROCODONE-ACETAMINOPHEN 5-325 MG PO TABS
1.0000 | ORAL_TABLET | Freq: Four times a day (QID) | ORAL | 0 refills | Status: DC | PRN
  Filled 2023-09-29: qty 90, 23d supply, fill #0

## 2023-10-05 ENCOUNTER — Ambulatory Visit: Payer: Medicare Other

## 2023-10-25 ENCOUNTER — Encounter: Payer: Self-pay | Admitting: Cardiovascular Disease

## 2023-10-25 DIAGNOSIS — R072 Precordial pain: Secondary | ICD-10-CM

## 2023-10-26 MED ORDER — DIAZEPAM 5 MG PO TABS: 5.0000 mg | ORAL_TABLET | Freq: Once | ORAL | 0 refills | Status: DC | PRN

## 2023-10-26 NOTE — Telephone Encounter (Signed)
Called in RX for Valium 5 mg;  total of 1, with no refills; pt to take 1 hour prior to CT

## 2023-10-26 NOTE — Telephone Encounter (Signed)
Ok to give Valium 5 mg if he gets claustrophobic. But tell him  this is not an MRI, not a very confining test

## 2023-10-26 NOTE — Telephone Encounter (Signed)
Went over instructions with the patient over the phone, His CT is scheduled for 11/07/23. Also sent instructions over MyChart. Will have to call his pharmacy to send in RX for Valium.

## 2023-10-27 ENCOUNTER — Telehealth: Payer: Self-pay | Admitting: Family Medicine

## 2023-10-27 NOTE — Telephone Encounter (Signed)
Per patient , his cardiologist ordered a basic metabolic panel for him . Patient would like blood work done at [facility] . Please advise if order needs to be adjusted so that he can have them drawn here .

## 2023-10-28 ENCOUNTER — Other Ambulatory Visit: Payer: Self-pay

## 2023-10-28 DIAGNOSIS — E782 Mixed hyperlipidemia: Secondary | ICD-10-CM

## 2023-10-28 DIAGNOSIS — I4891 Unspecified atrial fibrillation: Secondary | ICD-10-CM

## 2023-10-28 NOTE — Telephone Encounter (Signed)
Ok with me. Please place any necessary orders. 

## 2023-10-28 NOTE — Telephone Encounter (Signed)
Future orders placed and lab appt scheduled

## 2023-10-28 NOTE — Telephone Encounter (Signed)
Please let me know if ok to place order for patient to have drawn in our lab

## 2023-10-30 ENCOUNTER — Other Ambulatory Visit: Payer: Self-pay | Admitting: Family Medicine

## 2023-10-30 ENCOUNTER — Encounter: Payer: Self-pay | Admitting: Family Medicine

## 2023-10-31 ENCOUNTER — Other Ambulatory Visit: Payer: Self-pay | Admitting: Family Medicine

## 2023-10-31 ENCOUNTER — Other Ambulatory Visit (INDEPENDENT_AMBULATORY_CARE_PROVIDER_SITE_OTHER): Payer: Medicare Other

## 2023-10-31 DIAGNOSIS — E782 Mixed hyperlipidemia: Secondary | ICD-10-CM

## 2023-10-31 DIAGNOSIS — M5416 Radiculopathy, lumbar region: Secondary | ICD-10-CM

## 2023-10-31 DIAGNOSIS — I4891 Unspecified atrial fibrillation: Secondary | ICD-10-CM

## 2023-10-31 MED ORDER — HYDROCODONE-ACETAMINOPHEN 5-325 MG PO TABS: 1.0000 | ORAL_TABLET | Freq: Four times a day (QID) | ORAL | 0 refills | Status: DC | PRN

## 2023-10-31 NOTE — Telephone Encounter (Signed)
Pt requesting refill for Hydrocodone 5-325 mg. Last OV 05/03/2023.

## 2023-11-01 LAB — BASIC METABOLIC PANEL
BUN: 22 mg/dL (ref 6–23)
CO2: 28 meq/L (ref 19–32)
Calcium: 9.4 mg/dL (ref 8.4–10.5)
Chloride: 102 meq/L (ref 96–112)
Creatinine, Ser: 1.05 mg/dL (ref 0.40–1.50)
GFR: 71.25 mL/min (ref >60.00)
Glucose, Bld: 110 mg/dL — ABNORMAL HIGH (ref 70–99)
Potassium: 4.4 meq/L (ref 3.5–5.1)
Sodium: 138 meq/L (ref 135–145)

## 2023-11-02 ENCOUNTER — Ambulatory Visit: Payer: Medicare Other

## 2023-11-02 VITALS — Wt 175.0 lb

## 2023-11-02 DIAGNOSIS — Z0000 Encounter for general adult medical examination without abnormal findings: Secondary | ICD-10-CM | POA: Diagnosis not present

## 2023-11-02 NOTE — Patient Instructions (Signed)
Jonathan Beasley , Thank you for taking time to come for your Medicare Wellness Visit. I appreciate your ongoing commitment to your health goals. Please review the following plan we discussed and let me know if I can assist you in the future.   Referrals/Orders/Follow-Ups/Clinician Recommendations: maintain health and activity    This is a list of the screening recommended for you and due dates:  Health Maintenance  Topic Date Due   Medicare Annual Wellness Visit  Never done   Pneumonia Vaccine (1 of 2 - PCV) Never done   Zoster (Shingles) Vaccine (1 of 2) Never done   Flu Shot  06/16/2023   Hepatitis C Screening  11/03/2023*   Colon Cancer Screening  04/13/2024*   HPV Vaccine  Aged Out   DTaP/Tdap/Td vaccine  Discontinued   COVID-19 Vaccine  Discontinued  *Topic was postponed. The date shown is not the original due date.    Advanced directives: (Copy Requested) Please bring a copy of your health care power of attorney and living will to the office to be added to your chart at your convenience.  Next Medicare Annual Wellness Visit scheduled for next year: Yes

## 2023-11-02 NOTE — Progress Notes (Signed)
Subjective:   Jonathan Beasley is a 72 y.o. male who presents for an Initial Medicare Annual Wellness Visit.  Visit Complete: Virtual I connected with  Jonathan Beasley on 11/02/23 by a audio enabled telemedicine application and verified that I am speaking with the correct person using two identifiers.  Patient Location: Home  Provider Location: Home Office  I discussed the limitations of evaluation and management by telemedicine. The patient expressed understanding and agreed to proceed.  Vital Signs: Because this visit was a virtual/telehealth visit, some criteria may be missing or patient reported. Any vitals not documented were not able to be obtained and vitals that have been documented are patient reported.  Patient Medicare AWV questionnaire was completed by the patient on 11/01/23; I have confirmed that all information answered by patient is correct and no changes since this date.  Cardiac Risk Factors include: advanced age (>54men, >43 women);dyslipidemia;hypertension;male gender     Objective:    Today's Vitals   11/02/23 1513  Weight: 175 lb (79.4 kg)   Body mass index is 24.41 kg/m.     11/02/2023    3:18 PM 11/29/2022   12:16 PM 11/17/2022   12:22 PM  Advanced Directives  Does Patient Have a Medical Advance Directive? Yes Yes Yes  Type of Estate agent of [place];Living will Healthcare Power of [place];Living will Healthcare Power of [place];Living will  Does patient want to make changes to medical advance directive?   No - Patient declined  Copy of Healthcare Power of Attorney in Chart? No - copy requested      Current Medications (verified) Outpatient Encounter Medications as of 11/02/2023  Medication Sig   Ascorbic Acid (VITAMIN C) 500 MG CHEW Chew 250 mg by mouth daily.   aspirin EC 81 MG tablet Take 81 mg by mouth daily. Swallow whole.   Coenzyme Q10 (CO Q-10) 200 MG CAPS Take 200 mg by mouth at bedtime.   cyclobenzaprine  (FLEXERIL) 10 MG tablet TAKE 1 TABLET BY MOUTH THREE TIMES A DAY AS NEEDED   diazepam (VALIUM) 5 MG tablet Take 1 tablet (5 mg total) by mouth once as needed for up to 1 dose for anxiety (Take 1 hour prior to CT).   gabapentin (NEURONTIN) 100 MG capsule TAKE 1-2 AT BEDTIME AS NEEDED.   gabapentin (NEURONTIN) 300 MG capsule Take 2 capsules (600 mg total) by mouth 3 (three) times daily.   hydrochlorothiazide (HYDRODIURIL) 25 MG tablet Take 1 tablet (25 mg total) by mouth daily. (Patient taking differently: Take 25 mg by mouth daily. Patient reported taking 25mg  bid)   HYDROcodone-acetaminophen (NORCO) 5-325 MG tablet Take 1 tablet by mouth every 6 (six) hours as needed for moderate pain (pain score 4-6).   Inositol Niacinate (NIACIN FLUSH FREE) 500 MG CAPS Take 500 mg by mouth at bedtime.   losartan (COZAAR) 50 MG tablet Take 1 tablet (50 mg total) by mouth daily.   Multiple Vitamin (MULTIVITAMIN) tablet Take 1 tablet by mouth daily.   Omega-3 Fatty Acids (FISH OIL) 300 MG CAPS Take by mouth.   apixaban (ELIQUIS) 5 MG TABS tablet Take 1 tablet (5 mg total) by mouth 2 (two) times daily. (Patient not taking: Reported on 11/02/2023)   No facility-administered encounter medications on file as of 11/02/2023.    Allergies (verified) Penicillins and Shrimp (diagnostic)   History: Past Medical History:  Diagnosis Date   Achilles tendonitis 07/26/2013   Allergy    Arthritis    Dysrhythmia  A-fib (onset 11/17/22)   Hyperlipidemia    Hypertension    Loose body in knee 06/02/2010   Mallet finger 08/03/2011   Restless legs    Tear of right hamstring 03/01/2017   Past Surgical History:  Procedure Laterality Date   KNEE ARTHROSCOPY Right 2013   LUMBAR LAMINECTOMY/DECOMPRESSION MICRODISCECTOMY Left 11/29/2022   Procedure: Laminectomy for facet/synovial cyst - L4-L5 - left;  Surgeon: Tia Alert, MD;  Location: [facility] OR;  Service: Neurosurgery;  Laterality: Left;  3C   Family History  Problem  Relation Age of Onset   Heart disease Father    Heart disease Maternal Grandfather    Heart disease Paternal Grandfather    Social History   Socioeconomic History   Marital status: Married    Spouse name: Not on file   Number of children: Not on file   Years of education: Not on file   Highest education level: Not on file  Occupational History   Occupation: MARKETING  Tobacco Use   Smoking status: Never   Smokeless tobacco: Never  Substance and Sexual Activity   Alcohol use: No   Drug use: No   Sexual activity: Yes  Other Topics Concern   Not on file  Social History Narrative   Married   Automotive engineer   Works in Economist      Social Drivers of Health   Financial Resource Strain: Low Risk  (11/01/2023)   Overall Financial Resource Strain (CARDIA)    Difficulty of Paying Living Expenses: Not very hard  Food Insecurity: No Food Insecurity (11/01/2023)   Hunger Vital Sign    Worried About Running Out of Food in the Last Year: Never true    Ran Out of Food in the Last Year: Never true  Transportation Needs: No Transportation Needs (11/01/2023)   PRAPARE - Administrator, Civil Service (Medical): No    Lack of Transportation (Non-Medical): No  Physical Activity: Sufficiently Active (11/01/2023)   Exercise Vital Sign    Days of Exercise per Week: 7 days    Minutes of Exercise per Session: 90 min  Stress: Stress Concern Present (11/01/2023)   [employer] of Occupational Health - Occupational Stress Questionnaire    Feeling of Stress : To some extent  Social Connections: Socially Integrated (11/01/2023)   Social Connection and Isolation Panel [NHANES]    Frequency of Communication with Friends and Family: More than three times a week    Frequency of Social Gatherings with Friends and Family: Twice a week    Attends Religious Services: More than 4 times per year    Active Member of [employer] or Organizations: Yes    Attends Hospital doctor: More than 4 times per year    Marital Status: Married    Tobacco Counseling Counseling given: Not Answered   Clinical Intake:  Pre-visit preparation completed: Yes  Pain : No/denies pain     BMI - recorded: 24.41 Nutritional Status: BMI of 19-24  Normal Nutritional Risks: None Diabetes: No  How often do you need to have someone help you when you read instructions, pamphlets, or other written materials from your doctor or pharmacy?: 2 - Rarely  Interpreter Needed?: No  Information entered by :: Lanier Ensign, LPN   Activities of Daily Living    11/01/2023    8:31 PM 11/17/2022   12:16 PM  In your present state of health, do you have any difficulty performing the following activities:  Hearing? 0  0  Vision? 0 0  Difficulty concentrating or making decisions? 0 0  Walking or climbing stairs? 1 0  Comment related to back surgery   Dressing or bathing? 0 0  Doing errands, shopping? 0   Preparing Food and eating ? N   Using the Toilet? N   In the past six months, have you accidently leaked urine? N   Do you have problems with loss of bowel control? N   Managing your Medications? N   Managing your Finances? N   Housekeeping or managing your Housekeeping? N     Patient Care Team: Ardith Dark, MD as PCP - General (Family Medicine) Croitoru, Rachelle Hora, MD as Consulting Physician (Cardiology)  Indicate any recent Medical Services you may have received from other than [facility] providers in the past year (date may be approximate).     Assessment:   This is a routine wellness examination for [place].  Hearing/Vision screen Hearing Screening - Comments:: Pt denies any hearing issues  Vision Screening - Comments:: Pt follows up with dr Charlotte Sanes for annual eye exams    Goals Addressed             This Visit's Progress    Patient Stated       Work towards no pain        Depression Screen    11/02/2023    3:18 PM 05/03/2023    1:53 PM 04/14/2023    7:26 AM  11/02/2022    8:07 AM 07/15/2022    1:51 PM 05/17/2022    3:07 PM 08/31/2021    8:05 AM  PHQ 2/9 Scores  PHQ - 2 Score 0 0 0 0 0 0 0    Fall Risk    11/01/2023    8:31 PM 05/03/2023    1:54 PM 04/14/2023    7:26 AM 11/02/2022    8:07 AM 07/15/2022    1:52 PM  Fall Risk   Falls in the past year? 1 0 0 0 0  Comment fall from jan2024      Number falls in past yr: 1 0 0 0 0  Injury with Fall? 0 0 0 0 0  Risk for fall due to : History of fall(s) No Fall Risks No Fall Risks No Fall Risks No Fall Risks  Follow up Falls prevention discussed        MEDICARE RISK AT HOME: Medicare Risk at Home Any stairs in or around the home?: No If so, are there any without handrails?: No Home free of loose throw rugs in walkways, pet beds, electrical cords, etc?: Yes Adequate lighting in your home to reduce risk of falls?: Yes Life alert?: No Use of a cane, walker or w/c?: No Grab bars in the bathroom?: No Shower chair or bench in shower?: No Elevated toilet seat or a handicapped toilet?: No  TIMED UP AND GO:  Was the test performed? No    Cognitive Function:        11/02/2023    3:24 PM  6CIT Screen  What Year? 0 points  What month? 0 points  What time? 0 points  Count back from 20 0 points  Months in reverse 0 points  Repeat phrase 0 points  Total Score 0 points    Immunizations Immunization History  Administered Date(s) Administered   Fluad Quad(high Dose 65+) 08/20/2019, 08/26/2020, 08/31/2021, 11/02/2022   Influenza, High Dose Seasonal PF 09/29/2017, 08/15/2018   Td 06/16/2003      Flu Vaccine  status: Due, Education has been provided regarding the importance of this vaccine. Advised may receive this vaccine at local pharmacy or Health Dept. Aware to provide a copy of the vaccination record if obtained from local pharmacy or Health Dept. Verbalized acceptance and understanding.  Pneumococcal vaccine status: Due, Education has been provided regarding the importance of this  vaccine. Advised may receive this vaccine at local pharmacy or Health Dept. Aware to provide a copy of the vaccination record if obtained from local pharmacy or Health Dept. Verbalized acceptance and understanding.  Covid-19 vaccine status: Declined, Education has been provided regarding the importance of this vaccine but patient still declined. Advised may receive this vaccine at local pharmacy or Health Dept.or vaccine clinic. Aware to provide a copy of the vaccination record if obtained from local pharmacy or Health Dept. Verbalized acceptance and understanding.  Qualifies for Shingles Vaccine? Yes   Zostavax completed No   Shingrix Completed?: No.    Education has been provided regarding the importance of this vaccine. Patient has been advised to call insurance company to determine out of pocket expense if they have not yet received this vaccine. Advised may also receive vaccine at local pharmacy or Health Dept. Verbalized acceptance and understanding.  Screening Tests Health Maintenance  Topic Date Due   Pneumonia Vaccine 49+ Years old (1 of 2 - PCV) Never done   Zoster Vaccines- Shingrix (1 of 2) Never done   Hepatitis C Screening  11/03/2023 (Originally 12/31/1968)   INFLUENZA VACCINE  02/13/2024 (Originally 06/16/2023)   Colonoscopy  04/13/2024 (Originally 12/31/1996)   Medicare Annual Wellness (AWV)  11/01/2024   HPV VACCINES  Aged Out   DTaP/Tdap/Td  Discontinued   COVID-19 Vaccine  Discontinued    Health Maintenance  Health Maintenance Due  Topic Date Due   Pneumonia Vaccine 61+ Years old (1 of 2 - PCV) Never done   Zoster Vaccines- Shingrix (1 of 2) Never done       Additional Screening:  Hepatitis C Screening: does qualify;  Vision Screening: Recommended annual ophthalmology exams for early detection of glaucoma and other disorders of the eye. Is the patient up to date with their annual eye exam?  Yes  Who is the provider or what is the name of the office in which the  patient attends annual eye exams? Dr Charlotte Sanes  If pt is not established with a provider, would they like to be referred to a provider to establish care? No .   Dental Screening: Recommended annual dental exams for proper oral hygiene    Community Resource Referral / Chronic Care Management: CRR required this visit?  No   CCM required this visit?  No    Plan:     I have personally reviewed and noted the following in the patient's chart:   Medical and social history Use of alcohol, tobacco or illicit drugs  Current medications and supplements including opioid prescriptions. Patient is currently taking opioid prescriptions. Information provided to patient regarding non-opioid alternatives. Patient advised to discuss non-opioid treatment plan with their provider. Functional ability and status Nutritional status Physical activity Advanced directives List of other physicians Hospitalizations, surgeries, and ER visits in previous 12 months Vitals Screenings to include cognitive, depression, and falls Referrals and appointments  In addition, I have reviewed and discussed with patient certain preventive protocols, quality metrics, and best practice recommendations. A written personalized care plan for preventive services as well as general preventive health recommendations were provided to patient.     Inetta Fermo  H Carlee Tesfaye, LPN   [id number]   After Visit Summary: (MyChart) Due to this being a telephonic visit, the after visit summary with patients personalized plan was offered to patient via MyChart   Nurse Notes: none

## 2023-11-02 NOTE — Progress Notes (Signed)
Sugar is mildly elevated but if this was a nonfasting value then it is still within range.  Everything else is normal.

## 2023-11-03 ENCOUNTER — Telehealth (HOSPITAL_COMMUNITY): Payer: Self-pay | Admitting: Emergency Medicine

## 2023-11-03 NOTE — Telephone Encounter (Signed)
Reaching out to patient to offer assistance regarding upcoming cardiac imaging study; pt verbalizes understanding of appt date/time, parking situation and where to check in, pre-test NPO status and medications ordered, and verified current allergies; name and call back number provided for further questions should they arise Marchia Bond RN Navigator Cardiac Imaging [facility] [phone] office [phone] cell   '100mg'$  metoprolol tartrate

## 2023-11-07 ENCOUNTER — Ambulatory Visit (HOSPITAL_COMMUNITY)
Admission: RE | Admit: 2023-11-07 | Discharge: 2023-11-07 | Disposition: A | Discharge status: Home / Self Care | Payer: Medicare Other | Source: Ambulatory Visit | Attending: Cardiovascular Disease | Admitting: Cardiovascular Disease

## 2023-11-07 DIAGNOSIS — R931 Abnormal findings on diagnostic imaging of heart and coronary circulation: Secondary | ICD-10-CM | POA: Insufficient documentation

## 2023-11-07 DIAGNOSIS — R072 Precordial pain: Secondary | ICD-10-CM | POA: Insufficient documentation

## 2023-11-07 DIAGNOSIS — I2510 Atherosclerotic heart disease of native coronary artery without angina pectoris: Secondary | ICD-10-CM | POA: Diagnosis not present

## 2023-11-07 MED ORDER — IOHEXOL 350 MG/ML SOLN
190.0000 mL | Freq: Once | INTRAVENOUS | Status: AC | PRN
  Administered 2023-11-07: 190 mL via INTRAVENOUS

## 2023-11-07 MED ORDER — NITROGLYCERIN 0.4 MG SL SUBL
0.8000 mg | SUBLINGUAL_TABLET | Freq: Once | SUBLINGUAL | Status: AC
  Administered 2023-11-07: 0.8 mg via SUBLINGUAL

## 2023-11-07 MED ORDER — DILTIAZEM HCL 25 MG/5ML IV SOLN: 10.0000 mg | INTRAVENOUS | Status: DC | PRN

## 2023-11-07 MED ORDER — METOPROLOL TARTRATE 5 MG/5ML IV SOLN: 10.0000 mg | Freq: Once | INTRAVENOUS | Status: DC | PRN

## 2023-11-07 MED ORDER — NITROGLYCERIN 0.4 MG SL SUBL
SUBLINGUAL_TABLET | SUBLINGUAL | Status: AC
  Filled 2023-11-07: qty 2

## 2023-11-08 ENCOUNTER — Other Ambulatory Visit: Payer: Self-pay | Admitting: Cardiology

## 2023-11-08 ENCOUNTER — Ambulatory Visit (HOSPITAL_COMMUNITY)
Admission: RE | Admit: 2023-11-08 | Discharge: 2023-11-08 | Disposition: A | Discharge status: Home / Self Care | Payer: Medicare Other | Source: Ambulatory Visit | Attending: Cardiology | Admitting: Cardiology

## 2023-11-08 DIAGNOSIS — I2510 Atherosclerotic heart disease of native coronary artery without angina pectoris: Secondary | ICD-10-CM | POA: Diagnosis not present

## 2023-11-08 DIAGNOSIS — R931 Abnormal findings on diagnostic imaging of heart and coronary circulation: Secondary | ICD-10-CM | POA: Diagnosis not present

## 2023-11-10 ENCOUNTER — Other Ambulatory Visit (HOSPITAL_COMMUNITY): Payer: Self-pay | Admitting: Emergency Medicine

## 2023-11-10 DIAGNOSIS — E782 Mixed hyperlipidemia: Secondary | ICD-10-CM

## 2023-11-10 MED ORDER — ROSUVASTATIN CALCIUM 20 MG PO TABS: 20.0000 mg | ORAL_TABLET | Freq: Every day | ORAL | 3 refills | Status: DC

## 2023-11-11 ENCOUNTER — Encounter: Payer: Medicare Other | Admitting: Family Medicine

## 2023-11-14 ENCOUNTER — Other Ambulatory Visit: Payer: Self-pay | Admitting: Family Medicine

## 2023-11-14 ENCOUNTER — Telehealth: Payer: Self-pay | Admitting: Emergency Medicine

## 2023-11-14 NOTE — Telephone Encounter (Signed)
Left the following information over voicemail per dpr:  Croitoru, Rachelle Hora, MD  Scheryl Marten, RN Cc: Ardith Dark, MD Although there is more plaque than average for age, there are no meaningful blockages.  The vessel that goes down the front wall of the heart has some gradual tapering but no discrete blockage that would benefit from angioplasty or stent.  No indication for cardiac catheterization at this time.  The focus is on risk factor modification, in particular keeping the LDL cholesterol low. On his lipid panel from a year ago the LDL cholesterol 239.  Ideally we want to bring this down to less than 70, which is a 50% reduction.  He is already lean and physically active.  I think he needs to start taking a statin to reach that target.  Recommend rosuvastatin 20 mg once daily and a repeat lipid profile in 3 months.  Prescription for Rosuvastatin 20 mg sent to [facility]- [address] and I will remind you about getting repeat fasting lab work when it gets closer to time.  Left call back number for any questions.

## 2023-11-17 ENCOUNTER — Other Ambulatory Visit: Payer: Self-pay | Admitting: Family Medicine

## 2023-11-28 ENCOUNTER — Other Ambulatory Visit: Payer: Self-pay | Admitting: Family Medicine

## 2023-11-29 ENCOUNTER — Other Ambulatory Visit (HOSPITAL_BASED_OUTPATIENT_CLINIC_OR_DEPARTMENT_OTHER): Payer: Self-pay

## 2023-11-29 ENCOUNTER — Encounter: Payer: Self-pay | Admitting: Family Medicine

## 2023-11-29 ENCOUNTER — Ambulatory Visit (INDEPENDENT_AMBULATORY_CARE_PROVIDER_SITE_OTHER): Payer: Medicare Other | Admitting: Family Medicine

## 2023-11-29 VITALS — BP 137/81 | HR 62 | Temp 96.8°F | Ht 71.0 in | Wt 182.0 lb

## 2023-11-29 DIAGNOSIS — I10 Essential (primary) hypertension: Secondary | ICD-10-CM | POA: Diagnosis not present

## 2023-11-29 DIAGNOSIS — M47816 Spondylosis without myelopathy or radiculopathy, lumbar region: Secondary | ICD-10-CM

## 2023-11-29 DIAGNOSIS — Z0001 Encounter for general adult medical examination with abnormal findings: Secondary | ICD-10-CM

## 2023-11-29 DIAGNOSIS — E782 Mixed hyperlipidemia: Secondary | ICD-10-CM | POA: Diagnosis not present

## 2023-11-29 DIAGNOSIS — Z131 Encounter for screening for diabetes mellitus: Secondary | ICD-10-CM | POA: Diagnosis not present

## 2023-11-29 DIAGNOSIS — M4316 Spondylolisthesis, lumbar region: Secondary | ICD-10-CM

## 2023-11-29 DIAGNOSIS — I4891 Unspecified atrial fibrillation: Secondary | ICD-10-CM

## 2023-11-29 DIAGNOSIS — Z23 Encounter for immunization: Secondary | ICD-10-CM

## 2023-11-29 DIAGNOSIS — Z125 Encounter for screening for malignant neoplasm of prostate: Secondary | ICD-10-CM | POA: Diagnosis not present

## 2023-11-29 LAB — COMPREHENSIVE METABOLIC PANEL
ALT: 25 U/L (ref 0–53)
AST: 42 U/L — ABNORMAL HIGH (ref 0–37)
Albumin: 4.4 g/dL (ref 3.5–5.2)
Alkaline Phosphatase: 75 U/L (ref 39–117)
BUN: 27 mg/dL — ABNORMAL HIGH (ref 6–23)
CO2: 25 meq/L (ref 19–32)
Calcium: 9.6 mg/dL (ref 8.4–10.5)
Chloride: 100 meq/L (ref 96–112)
Creatinine, Ser: 1.07 mg/dL (ref 0.40–1.50)
GFR: 69.62 mL/min (ref >60.00)
Glucose, Bld: 95 mg/dL (ref 70–99)
Potassium: 4.1 meq/L (ref 3.5–5.1)
Sodium: 138 meq/L (ref 135–145)
Total Bilirubin: 0.8 mg/dL (ref 0.2–1.2)
Total Protein: 7.8 g/dL (ref 6.0–8.3)

## 2023-11-29 LAB — LIPID PANEL
Cholesterol: 237 mg/dL — ABNORMAL HIGH (ref 0–200)
HDL: 43.3 mg/dL (ref >39.00)
LDL Cholesterol: 156 mg/dL — ABNORMAL HIGH (ref 0–99)
NonHDL: 194.16
Total CHOL/HDL Ratio: 5
Triglycerides: 192 mg/dL — ABNORMAL HIGH (ref 0.0–149.0)
VLDL: 38.4 mg/dL (ref 0.0–40.0)

## 2023-11-29 LAB — HEMOGLOBIN A1C: Hgb A1c MFr Bld: 6.1 % (ref 4.6–6.5)

## 2023-11-29 LAB — CBC
HCT: 51.4 % (ref 39.0–52.0)
Hemoglobin: 16.9 g/dL (ref 13.0–17.0)
MCHC: 32.8 g/dL (ref 30.0–36.0)
MCV: 93.5 fL (ref 78.0–100.0)
Platelets: 258 10*3/uL (ref 150.0–400.0)
RBC: 5.5 Mil/uL (ref 4.22–5.81)
RDW: 13.3 % (ref 11.5–15.5)
WBC: 6 10*3/uL (ref 4.0–10.5)

## 2023-11-29 LAB — TSH: TSH: 2.08 u[IU]/mL (ref 0.35–5.50)

## 2023-11-29 LAB — PSA: PSA: 1.32 ng/mL (ref 0.10–4.00)

## 2023-11-29 MED ORDER — HYDROCODONE-ACETAMINOPHEN 5-325 MG PO TABS
1.0000 | ORAL_TABLET | Freq: Four times a day (QID) | ORAL | 0 refills | Status: DC | PRN
  Filled 2023-11-29: qty 90, 23d supply, fill #0

## 2023-11-29 NOTE — Patient Instructions (Addendum)
 It was very nice to see you today!  We will check blood work today.  Keep working on diet and exercise.   Return in about 1 year (around 11/28/2024) for Annual Physical.   Take care, Dr Kennyth  PLEASE NOTE:  If you had any lab tests, please let us  know if you have not heard back within a few days. You may see your results on mychart before we have a chance to review them but we will give you a call once they are reviewed by us .   If we ordered any referrals today, please let us  know if you have not heard from their office within the next week.   If you had any urgent prescriptions sent in today, please check with the pharmacy within an hour of our visit to make sure the prescription was transmitted appropriately.   Please try these tips to maintain a healthy lifestyle:  Eat at least 3 REAL meals and 1-2 snacks per day.  Aim for no more than 5 hours between eating.  If you eat breakfast, please do so within one hour of getting up.   Each meal should contain half fruits/vegetables, one quarter protein, and one quarter carbs (no bigger than a computer mouse)  Cut down on sweet beverages. This includes juice, soda, and sweet tea.   Drink at least 1 glass of water with each meal and aim for at least 8 glasses per day  Exercise at least 150 minutes every week.    Preventive Care 79 Years and Older, Male Preventive care refers to lifestyle choices and visits with your health care provider that can promote health and wellness. Preventive care visits are also called wellness exams. What can I expect for my preventive care visit? Counseling During your preventive care visit, your health care provider may ask about your: Medical history, including: Past medical problems. Family medical history. History of falls. Current health, including: Emotional well-being. Home life and relationship well-being. Sexual activity. Memory and ability to understand (cognition). Lifestyle,  including: Alcohol, nicotine or tobacco, and drug use. Access to firearms. Diet, exercise, and sleep habits. Work and work astronomer. Sunscreen use. Safety issues such as seatbelt and bike helmet use. Physical exam Your health care provider will check your: Height and weight. These may be used to calculate your BMI (body mass index). BMI is a measurement that tells if you are at a healthy weight. Waist circumference. This measures the distance around your waistline. This measurement also tells if you are at a healthy weight and may help predict your risk of certain diseases, such as type 2 diabetes and high blood pressure. Heart rate and blood pressure. Body temperature. Skin for abnormal spots. What immunizations do I need?  Vaccines are usually given at various ages, according to a schedule. Your health care provider will recommend vaccines for you based on your age, medical history, and lifestyle or other factors, such as travel or where you work. What tests do I need? Screening Your health care provider may recommend screening tests for certain conditions. This may include: Lipid and cholesterol levels. Diabetes screening. This is done by checking your blood sugar (glucose) after you have not eaten for a while (fasting). Hepatitis C test. Hepatitis B test. HIV (human immunodeficiency virus) test. STI (sexually transmitted infection) testing, if you are at risk. Lung cancer screening. Colorectal cancer screening. Prostate cancer screening. Abdominal aortic aneurysm (AAA) screening. You may need this if you are a current or former smoker. Talk  with your health care provider about your test results, treatment options, and if necessary, the need for more tests. Follow these instructions at home: Eating and drinking  Eat a diet that includes fresh fruits and vegetables, whole grains, lean protein, and low-fat dairy products. Limit your intake of foods with high amounts of sugar,  saturated fats, and salt. Take vitamin and mineral supplements as recommended by your health care provider. Do not drink alcohol if your health care provider tells you not to drink. If you drink alcohol: Limit how much you have to 0-2 drinks a day. Know how much alcohol is in your drink. In the [place] one drink equals one 12 oz bottle of beer (355 mL), one 5 oz glass of wine (148 mL), or one 1 oz glass of hard liquor (44 mL). Lifestyle Brush your teeth every morning and night with fluoride toothpaste. Floss one time each day. Exercise for at least 30 minutes 5 or more days each week. Do not use any products that contain nicotine or tobacco. These products include cigarettes, chewing tobacco, and vaping devices, such as e-cigarettes. If you need help quitting, ask your health care provider. Do not use drugs. If you are sexually active, practice safe sex. Use a condom or other form of protection to prevent STIs. Take aspirin only as told by your health care provider. Make sure that you understand how much to take and what form to take. Work with your health care provider to find out whether it is safe and beneficial for you to take aspirin daily. Ask your health care provider if you need to take a cholesterol-lowering medicine (statin). Find healthy ways to manage stress, such as: Meditation, yoga, or listening to music. Journaling. Talking to a trusted person. Spending time with friends and family. Safety Always wear your seat belt while driving or riding in a vehicle. Do not drive: If you have been drinking alcohol. Do not ride with someone who has been drinking. When you are tired or distracted. While texting. If you have been using any mind-altering substances or drugs. Wear a helmet and other protective equipment during sports activities. If you have firearms in your house, make sure you follow all gun safety procedures. Minimize exposure to UV radiation to reduce your risk of skin  cancer. What's next? Visit your health care provider once a year for an annual wellness visit. Ask your health care provider how often you should have your eyes and teeth checked. Stay up to date on all vaccines. This information is not intended to replace advice given to you by your health care provider. Make sure you discuss any questions you have with your health care provider. Document Revised: 04/29/2021 Document Reviewed: 04/29/2021 Elsevier Patient Education  2024 [employer].

## 2023-11-29 NOTE — Assessment & Plan Note (Signed)
 Following with cardiology. He is anticoagulated on eliquis 5 mg twice daily.

## 2023-11-29 NOTE — Assessment & Plan Note (Addendum)
 Overall manageable though did have a recent flareup after shoveling snow. He is currently on gabapentin  600mg  three times daily and norco as needed. Medications help with his pain and ability to perform ADLs. No significant side effects. Does not need refills today.

## 2023-11-29 NOTE — Assessment & Plan Note (Addendum)
 Check lipids. He was prescribed crestor 20 mg daily by cardiology but he has not yet started. Discussed lifestyle modifications.

## 2023-11-29 NOTE — Progress Notes (Signed)
 Chief Complaint:  Jonathan Beasley is a 73 y.o. male who presents today for his annual comprehensive physical exam.    Assessment/Plan:  Chronic Problems Addressed Today: Lumbar spondylosis Overall manageable though did have a recent flareup after shoveling snow. He is currently on gabapentin  600mg  three times daily and norco as needed. Medications help with his pain and ability to perform ADLs. No significant side effects. Does not need refills today.   HLD (hyperlipidemia) Check lipids. He was prescribed crestor  20 mg daily by cardiology but he has not yet started. Discussed lifestyle modifications.   Essential hypertension Blood pressure at goal today on hydrochlorothiazide  25 mg daily and losartan  50 mg daily. Check labs.   Atrial fibrillation [facility]) Following with cardiology. He is anticoagulated on eliquis  5 mg twice daily.  Preventative Healthcare: Check labs. Fly shot given today. Due for colon cancer screening - he is doing yearly stool cards through insurance.   Patient Counseling(The following topics were reviewed and/or handout was given):  -Nutrition: Stressed importance of moderation in sodium/caffeine intake, saturated fat and cholesterol, caloric balance, sufficient intake of fresh fruits, vegetables, and fiber.  -Stressed the importance of regular exercise.   -Substance Abuse: Discussed cessation/primary prevention of tobacco, alcohol, or other drug use; driving or other dangerous activities under the influence; availability of treatment for abuse.   -Injury prevention: Discussed safety belts, safety helmets, smoke detector, smoking near bedding or upholstery.   -Sexuality: Discussed sexually transmitted diseases, partner selection, use of condoms, avoidance of unintended pregnancy and contraceptive alternatives.   -Dental health: Discussed importance of regular tooth brushing, flossing, and dental visits.  -Health maintenance and immunizations reviewed. Please refer  to Health maintenance section.  Return to care in 1 year for next preventative visit.     Subjective:  HPI:  He has no acute complaints today. See Assessment / plan for status of chronic conditions.   Lifestyle Diet: Balanced. Trying to get plenty of fruits and vegetables.  Exercise: Limited due to recent back issues.      11/29/2023    8:30 AM  Depression screen PHQ 2/9  Decreased Interest 0  Down, Depressed, Hopeless 0  PHQ - 2 Score 0    There are no preventive care reminders to display for this patient.   ROS: Per HPI, otherwise a complete review of systems was negative.   PMH:  The following were reviewed and entered/updated in epic: Past Medical History:  Diagnosis Date   Achilles tendonitis 07/26/2013   Allergy    Arthritis    Dysrhythmia    A-fib (onset 11/17/22)   Hyperlipidemia    Hypertension    Loose body in knee 06/02/2010   Mallet finger 08/03/2011   Restless legs    Tear of right hamstring 03/01/2017   Patient Active Problem List   Diagnosis Date Noted   Atrial fibrillation (HCC) 11/22/2022   Essential hypertension 05/17/2022   Exercise-induced asthma 01/29/2021   Insomnia 04/28/2020   Myalgia due to statin 08/15/2018   Lumbar spondylosis 08/29/2017   Restless legs    Arthritis    HLD (hyperlipidemia) 05/13/2014   Past Surgical History:  Procedure Laterality Date   KNEE ARTHROSCOPY Right 2013   LUMBAR LAMINECTOMY/DECOMPRESSION MICRODISCECTOMY Left 11/29/2022   Procedure: Laminectomy for facet/synovial cyst - L4-L5 - left;  Surgeon: Joshua Alm RAMAN, MD;  Location: [facility] OR;  Service: Neurosurgery;  Laterality: Left;  3C    Family History  Problem Relation Age of Onset   Heart disease Father  Heart disease Maternal Grandfather    Heart disease Paternal Grandfather     Medications- reviewed and updated Current Outpatient Medications  Medication Sig Dispense Refill   apixaban  (ELIQUIS ) 5 MG TABS tablet Take 1 tablet (5 mg total) by mouth 2  (two) times daily. 60 tablet 3   Ascorbic Acid (VITAMIN C) 500 MG CHEW Chew 250 mg by mouth daily.     aspirin EC 81 MG tablet Take 81 mg by mouth daily. Swallow whole.     Coenzyme Q10 (CO Q-10) 200 MG CAPS Take 200 mg by mouth at bedtime.     cyclobenzaprine  (FLEXERIL ) 10 MG tablet TAKE 1 TABLET BY MOUTH THREE TIMES A DAY AS NEEDED 90 tablet 3   diazepam  (VALIUM ) 5 MG tablet Take 1 tablet (5 mg total) by mouth once as needed for up to 1 dose for anxiety (Take 1 hour prior to CT). 1 tablet 0   gabapentin  (NEURONTIN ) 100 MG capsule TAKE 1-2 AT BEDTIME AS NEEDED. 180 capsule 3   gabapentin  (NEURONTIN ) 300 MG capsule Take 2 capsules (600 mg total) by mouth 3 (three) times daily. 180 capsule 2   hydrochlorothiazide  (HYDRODIURIL ) 25 MG tablet TAKE 1 TABLET (25 MG TOTAL) BY MOUTH DAILY. 90 tablet 0   HYDROcodone -acetaminophen  (NORCO) 5-325 MG tablet Take 1 tablet by mouth every 6 (six) hours as needed for moderate pain (pain score 4-6). 90 tablet 0   Inositol Niacinate (NIACIN  FLUSH FREE) 500 MG CAPS Take 500 mg by mouth at bedtime.     losartan  (COZAAR ) 50 MG tablet TAKE 1 TABLET BY MOUTH EVERY DAY 30 tablet 0   Multiple Vitamin (MULTIVITAMIN) tablet Take 1 tablet by mouth daily.     Omega-3 Fatty Acids (FISH OIL) 300 MG CAPS Take by mouth.     rosuvastatin  (CRESTOR ) 20 MG tablet Take 1 tablet (20 mg total) by mouth daily. 90 tablet 3   No current facility-administered medications for this visit.    Allergies-reviewed and updated Allergies  Allergen Reactions   Penicillins Other (See Comments)    Childhood- deathly ill   Shrimp (Diagnostic) Rash    Face turns red    Social History   Socioeconomic History   Marital status: Married    Spouse name: Not on file   Number of children: Not on file   Years of education: Not on file   Highest education level: Not on file  Occupational History   Occupation: MARKETING  Tobacco Use   Smoking status: Never   Smokeless tobacco: Never   Substance and Sexual Activity   Alcohol use: No   Drug use: No   Sexual activity: Yes  Other Topics Concern   Not on file  Social History Narrative   Married   Automotive Engineer   Works in Economist      Social Drivers of Health   Financial Resource Strain: Low Risk  (11/01/2023)   Overall Financial Resource Strain (CARDIA)    Difficulty of Paying Living Expenses: Not very hard  Food Insecurity: No Food Insecurity (11/01/2023)   Hunger Vital Sign    Worried About Running Out of Food in the Last Year: Never true    Ran Out of Food in the Last Year: Never true  Transportation Needs: No Transportation Needs (11/01/2023)   PRAPARE - Administrator, Civil Service (Medical): No    Lack of Transportation (Non-Medical): No  Physical Activity: Sufficiently Active (11/01/2023)   Exercise Vital Sign  Days of Exercise per Week: 7 days    Minutes of Exercise per Session: 90 min  Stress: Stress Concern Present (11/01/2023)   [employer] of Occupational Health - Occupational Stress Questionnaire    Feeling of Stress : To some extent  Social Connections: Socially Integrated (11/01/2023)   Social Connection and Isolation Panel [NHANES]    Frequency of Communication with Friends and Family: More than three times a week    Frequency of Social Gatherings with Friends and Family: Twice a week    Attends Religious Services: More than 4 times per year    Active Member of [employer] or Organizations: Yes    Attends Engineer, Structural: More than 4 times per year    Marital Status: Married        Objective:  Physical Exam: BP 137/81   Pulse 62   Temp (!) 96.8 F (36 C) (Temporal)   Ht 5' 11 (1.803 m)   Wt 182 lb (82.6 kg)   SpO2 97%   BMI 25.38 kg/m   Body mass index is 25.38 kg/m. Wt Readings from Last 3 Encounters:  11/29/23 182 lb (82.6 kg)  11/02/23 175 lb (79.4 kg)  04/14/23 175 lb (79.4 kg)   Gen: NAD, resting comfortably HEENT: TMs normal  bilaterally. OP clear. No thyromegaly noted.  CV: Irregular with no murmurs appreciated Pulm: NWOB, CTAB with no crackles, wheezes, or rhonchi GI: Normal bowel sounds present. Soft, Nontender, Nondistended. MSK: no edema, cyanosis, or clubbing noted Skin: warm, dry Neuro: CN2-12 grossly intact. Strength 5/5 in upper and lower extremities. Reflexes symmetric and intact bilaterally.  Psych: Normal affect and thought content     Maddalyn Lutze M. Kennyth, MD 11/29/2023 9:18 AM

## 2023-11-29 NOTE — Assessment & Plan Note (Addendum)
 Blood pressure at goal today on hydrochlorothiazide 25 mg daily and losartan 50 mg daily. Check labs.

## 2023-12-01 ENCOUNTER — Encounter: Payer: Self-pay | Admitting: Family Medicine

## 2023-12-01 DIAGNOSIS — R7303 Prediabetes: Secondary | ICD-10-CM | POA: Insufficient documentation

## 2023-12-01 NOTE — Progress Notes (Signed)
 His cholesterol is up a bit.  It would be a good idea for him to start the cholesterol medication to improve his numbers and low risk heart attack and stroke.  We should recheck in 6 to 12 months.  A1c is elevated.  Not in the diabetic range.  Does not need to start meds for this however it is important he try to lower this via diet and exercise.  We should recheck in 6 to 12 months.  The rest of his labs are all stable and we can recheck everything in a year or so.

## 2023-12-13 ENCOUNTER — Other Ambulatory Visit: Payer: Self-pay | Admitting: Family Medicine

## 2024-01-01 ENCOUNTER — Other Ambulatory Visit: Payer: Self-pay | Admitting: Family Medicine

## 2024-01-03 ENCOUNTER — Encounter: Payer: Self-pay | Admitting: Family Medicine

## 2024-01-03 ENCOUNTER — Other Ambulatory Visit (HOSPITAL_BASED_OUTPATIENT_CLINIC_OR_DEPARTMENT_OTHER): Payer: Self-pay

## 2024-01-03 MED ORDER — HYDROCODONE-ACETAMINOPHEN 5-325 MG PO TABS: 1.0000 | ORAL_TABLET | Freq: Four times a day (QID) | ORAL | 0 refills | Status: DC | PRN

## 2024-01-03 MED ORDER — HYDROCODONE-ACETAMINOPHEN 5-325 MG PO TABS
1.0000 | ORAL_TABLET | Freq: Four times a day (QID) | ORAL | 0 refills | Status: DC | PRN
  Filled 2024-01-03: qty 90, 23d supply, fill #0

## 2024-01-16 ENCOUNTER — Other Ambulatory Visit (HOSPITAL_BASED_OUTPATIENT_CLINIC_OR_DEPARTMENT_OTHER): Payer: Self-pay

## 2024-01-27 ENCOUNTER — Other Ambulatory Visit: Payer: Self-pay | Admitting: *Deleted

## 2024-01-27 ENCOUNTER — Other Ambulatory Visit: Payer: Self-pay | Admitting: Family Medicine

## 2024-01-27 ENCOUNTER — Encounter: Payer: Self-pay | Admitting: Family Medicine

## 2024-01-27 DIAGNOSIS — M5416 Radiculopathy, lumbar region: Secondary | ICD-10-CM

## 2024-01-30 ENCOUNTER — Other Ambulatory Visit (HOSPITAL_BASED_OUTPATIENT_CLINIC_OR_DEPARTMENT_OTHER): Payer: Self-pay

## 2024-01-30 ENCOUNTER — Other Ambulatory Visit: Payer: Self-pay | Admitting: *Deleted

## 2024-01-30 ENCOUNTER — Other Ambulatory Visit: Payer: Self-pay | Admitting: Family Medicine

## 2024-01-30 DIAGNOSIS — M5416 Radiculopathy, lumbar region: Secondary | ICD-10-CM

## 2024-01-30 MED ORDER — HYDROCODONE-ACETAMINOPHEN 5-325 MG PO TABS
1.0000 | ORAL_TABLET | Freq: Four times a day (QID) | ORAL | 0 refills | Status: DC | PRN
  Filled 2024-01-30: qty 90, 23d supply, fill #0

## 2024-01-30 MED ORDER — GABAPENTIN 100 MG PO CAPS
ORAL_CAPSULE | ORAL | 3 refills | Status: AC
Start: 2024-01-30 — End: ?

## 2024-02-10 ENCOUNTER — Other Ambulatory Visit: Payer: Self-pay | Admitting: Family Medicine

## 2024-02-17 ENCOUNTER — Encounter: Payer: Self-pay | Admitting: Emergency Medicine

## 2024-02-29 ENCOUNTER — Other Ambulatory Visit: Payer: Self-pay | Admitting: Family Medicine

## 2024-02-29 ENCOUNTER — Encounter: Payer: Self-pay | Admitting: Family Medicine

## 2024-03-01 ENCOUNTER — Other Ambulatory Visit: Payer: Self-pay | Admitting: Family Medicine

## 2024-03-01 ENCOUNTER — Other Ambulatory Visit (HOSPITAL_BASED_OUTPATIENT_CLINIC_OR_DEPARTMENT_OTHER): Payer: Self-pay

## 2024-03-01 MED ORDER — HYDROCODONE-ACETAMINOPHEN 5-325 MG PO TABS
1.0000 | ORAL_TABLET | Freq: Four times a day (QID) | ORAL | 0 refills | Status: DC | PRN
  Filled 2024-03-01: qty 90, 23d supply, fill #0

## 2024-03-01 NOTE — Telephone Encounter (Signed)
 Please see patient request and send to [facility] pharmacy today.

## 2024-03-05 ENCOUNTER — Encounter: Payer: Self-pay | Admitting: Family Medicine

## 2024-03-06 ENCOUNTER — Telehealth (INDEPENDENT_AMBULATORY_CARE_PROVIDER_SITE_OTHER): Admitting: Family Medicine

## 2024-03-06 VITALS — Ht 71.0 in | Wt 180.0 lb

## 2024-03-06 DIAGNOSIS — G4700 Insomnia, unspecified: Secondary | ICD-10-CM

## 2024-03-06 DIAGNOSIS — M47816 Spondylosis without myelopathy or radiculopathy, lumbar region: Secondary | ICD-10-CM

## 2024-03-06 MED ORDER — BELSOMRA 10 MG PO TABS: 10.0000 mg | ORAL_TABLET | Freq: Every evening | ORAL | 5 refills | Status: DC | PRN

## 2024-03-06 NOTE — Assessment & Plan Note (Addendum)
 Not controlled. Had lengthy discussion with patient today regarding his insomnia.  He does have a lot of external stressors as well including the health of his son however this does seem to be improving.  He has not had much benefit with trazodone  in the past.  Due to his use of Norco, we will need to avoid benzo or benzo like medications.  Will try Belsomra  10 mg nightly.  We discussed potential side effects.  He will follow-up with us  in a few weeks via MyChart we can titrate as needed.

## 2024-03-06 NOTE — Telephone Encounter (Signed)
 Patient had a VV with PCP today

## 2024-03-06 NOTE — Progress Notes (Signed)
   Jonathan Beasley is a 73 y.o. male who presents today for a virtual office visit.  Assessment/Plan:  Chronic Problems Addressed Today: Insomnia Not controlled. Had lengthy discussion with patient today regarding his insomnia.  He does have a lot of external stressors as well including the health of his son however this does seem to be improving.  He has not had much benefit with trazodone  in the past.  Due to his use of Norco, we will need to avoid benzo or benzo like medications.  Will try Belsomra  10 mg nightly.  We discussed potential side effects.  He will follow-up with us  in a few weeks via MyChart we can titrate as needed.  Lumbar spondylosis Overall stable on gabapentin  600 mg 3 times daily and Norco as needed.  Will avoid benzos as above.  Does not need refill on either of these medications today.     Subjective:  HPI:  See assessment / plan for status of chronic conditions. Patient here today to discuss insomnia.  This has been an ongoing issue for multiple years.  He has been on a few medications for this in the past including trazodone  without much benefit.  Currently taking gabapentin  for restless legs which gives him some benefit though still has difficulty with falling asleep and staying asleep.  He would like to discuss alternative treatment options.       Objective/Observations  Physical Exam: Gen: NAD, resting comfortably Pulm: Normal work of breathing Neuro: Grossly normal, moves all extremities Psych: Normal affect and thought content  Virtual Visit via Video   I connected with Jonathan Beasley on 03/06/24 at  9:00 AM EDT by a video enabled telemedicine application and verified that I am speaking with the correct person using two identifiers. The limitations of evaluation and management by telemedicine and the availability of in person appointments were discussed. The patient expressed understanding and agreed to proceed.   Patient location: Home Provider  location: [place] [employer] Persons participating in the virtual visit: Myself and Patient     Jinny Mounts. Daneil Dunker, MD 03/06/2024 9:44 AM

## 2024-03-06 NOTE — Assessment & Plan Note (Signed)
 Overall stable on gabapentin  600 mg 3 times daily and Norco as needed.  Will avoid benzos as above.  Does not need refill on either of these medications today.

## 2024-03-27 ENCOUNTER — Encounter: Payer: Self-pay | Admitting: Family Medicine

## 2024-03-27 MED ORDER — HYDROCODONE-ACETAMINOPHEN 5-325 MG PO TABS: 1.0000 | ORAL_TABLET | Freq: Four times a day (QID) | ORAL | 0 refills | Status: DC | PRN

## 2024-04-23 ENCOUNTER — Encounter: Payer: Self-pay | Admitting: Family Medicine

## 2024-04-25 MED ORDER — HYDROCODONE-ACETAMINOPHEN 5-325 MG PO TABS: 1.0000 | ORAL_TABLET | Freq: Four times a day (QID) | ORAL | 0 refills | Status: DC | PRN

## 2024-04-26 ENCOUNTER — Other Ambulatory Visit: Payer: Self-pay | Admitting: Family Medicine

## 2024-04-26 DIAGNOSIS — M5416 Radiculopathy, lumbar region: Secondary | ICD-10-CM

## 2024-05-08 ENCOUNTER — Other Ambulatory Visit: Payer: Self-pay | Admitting: Family Medicine

## 2024-05-10 ENCOUNTER — Ambulatory Visit: Admitting: Sports Medicine

## 2024-05-10 ENCOUNTER — Other Ambulatory Visit: Payer: Self-pay

## 2024-05-10 VITALS — BP 128/88 | Ht 71.0 in | Wt 178.0 lb

## 2024-05-10 DIAGNOSIS — M79672 Pain in left foot: Secondary | ICD-10-CM

## 2024-05-10 DIAGNOSIS — G8929 Other chronic pain: Secondary | ICD-10-CM | POA: Insufficient documentation

## 2024-05-10 NOTE — Progress Notes (Signed)
 Patient is a committed runner He has significant lumbar spine breakdown with large paravertebral cysts.  In spite of this he has been able to get back into a running program and gradually lessening his low back pain and limitations.  He runs with a regular group and recently has been having to stop after anywhere from 2 to 4 miles into a run because of the left mid heel pain He has a callus right over the area where he gets the pain He finds if he softens the callus with Vaseline he gets less pain  He has tried changing shoes and increasing the padding with Spenco insoles but that has not been enough  Physical exam Older white male in no acute distress BP 128/88   Ht 5' 11 (1.803 m)   Wt 178 lb (80.7 kg)   BMI 24.83 kg/m   On his os calcis on the left there is a callus and there is also palpable tenderness to pressure Palpating his plantar fascia did not really bring out any pain Foot and ankle motion evaluation are normal  Ultrasound of left heel At the midpoint of the os calcis there is a small bony avulsion This corresponds to his callus and to his point of maximal tenderness This is visualized in short and long axis and there is a very mild heel spur noted as well Plantar fascia itself looks normal and the width is only 0.38 cm Comparison to the right heel shows that there are no changes noted on the right heel  Impression: Os calcis contusion with small bony avulsion  Ultrasound and interpretation by Helene NOVAK. Harvey, MD

## 2024-05-10 NOTE — Assessment & Plan Note (Signed)
 I explained to the patient that there is obviously get an abnormal pressure on his os calcis We had him a donut pad and suggested that he try using these in his shoes to take pressure off this  In addition we gave him a trial of an ESWT treatment as this might help break down the small bony fragment and causing less pain If this works we will resume getting weekly treatments for 4 to 6 weeks  ESWT Trial Head size large Frequency 16 Power level 90 mJ Location left os calcis Total impulses 2000  Patient tolerated this well with some mild heel pain so we kept the power level at 90. If he sees some benefit with this we will give him a full treatment trial Acute or resolving his problem is to get pressure off his os calcis and so I want him to continue to work to modify his footwear

## 2024-05-15 ENCOUNTER — Encounter: Payer: Self-pay | Admitting: Family Medicine

## 2024-05-15 ENCOUNTER — Ambulatory Visit (INDEPENDENT_AMBULATORY_CARE_PROVIDER_SITE_OTHER): Payer: Self-pay | Admitting: Family Medicine

## 2024-05-15 VITALS — Ht 71.0 in

## 2024-05-15 DIAGNOSIS — M79672 Pain in left foot: Secondary | ICD-10-CM

## 2024-05-15 NOTE — Patient Instructions (Signed)

## 2024-05-15 NOTE — Progress Notes (Signed)
 FOLLOW-UP VISIT:  Shockwave Therapy Procedure   NAME:  Jonathan Beasley DOB:  27-Feb-1951 MR#: [id number] DATE OF VISIT:  05/15/2024  Encounter:   Jonathan Beasley comes in for a follow up evaluation and 2nd Radial Extracorporeal Shockwave therapy (ESWT) treatment to the  Left heel.  Jonathan Beasley reports some improvement with last treatment.  He notes has not been running because he irritated his back.  Has been wearing pads with cut out in his shoes.    ESWT Procedure Note: Treatment #2  Diagnosis: Left heel pain  Procedure Details  Consent for the procedure was obtained. Time out was performed. The anatomical landmarks and target areas for the procedure were identified.   PROCEDURE: ESWT was discussed with the patient in detail.  The risk and benefits were explained.  The point of maximal tenderness was identified and the oscillator probe was applied with moderate pressure. Treatment adjusted as mediated by patient feedback/pain.  Left os calcis was targeted for ESWT  Power level: 110 MJ Frequency: 16 hz Impulses: 2000 Head size: Large   Complications:  None; patient tolerated the procedure well.  ASSESSMENT/PLAN: 1. Pain of left heel Left heel pain with os calcis contusion with small bony avulsion as seen on MSK ultrasound last visit  Plan:   ESWT completed today as noted above Return in 1 week for ESWT procedure #3 Procedure aftercare reviewed Recommended avoiding strenuous activities and using OTC analgesia prn. Continue to wear donut pad to offload os calcis

## 2024-05-21 ENCOUNTER — Encounter: Payer: Self-pay | Admitting: Family Medicine

## 2024-05-21 ENCOUNTER — Ambulatory Visit (INDEPENDENT_AMBULATORY_CARE_PROVIDER_SITE_OTHER): Payer: Self-pay | Admitting: Family Medicine

## 2024-05-21 VITALS — Ht 71.0 in | Wt 178.0 lb

## 2024-05-21 DIAGNOSIS — M79672 Pain in left foot: Secondary | ICD-10-CM

## 2024-05-21 NOTE — Patient Instructions (Signed)

## 2024-05-21 NOTE — Progress Notes (Signed)
 FOLLOW-UP VISIT:  Shockwave Therapy Procedure   NAME:  Jonathan Beasley DOB:  08-Oct-1951 MR#: [id number] DATE OF VISIT:  05/21/2024  Encounter:   Jonathan Beasley comes in for a follow up evaluation and 3rd Radial Extracorporeal Shockwave therapy (ESWT) treatment to the  Left heel.  Jonathan Beasley reports minimal improvement with previous treatments.  Still feeling after running 3-4 miles.  Has been wearing cut-out insert in shoe   ESWT Procedure Note: Treatment #3  Diagnosis: Left heel pain   Procedure Details  Consent for the procedure was obtained. Time out was performed. The anatomical landmarks and target areas for the procedure were identified.   PROCEDURE: ESWT was discussed with the patient in detail.  The risk and benefits were explained.  The point of maximal tenderness was identified and the oscillator probe was applied with moderate pressure. Treatment adjusted as mediated by patient feedback/pain.  Left os calcis was targeted for ESWT   Power level: 120 MJ Frequency: 16 hz Impulses: 2000 Head size: Large   Complications:  None; patient tolerated the procedure well.  ASSESSMENT/PLAN: 1. Pain of left heel Left heel pain with os calcis contusion with small bony avulsion as seen on MSK ultrasound last visit  Plan:   ESWT completed today as noted above Return in 1 week for ESWT procedure #4 Procedure aftercare reviewed Recommended avoiding strenuous activities and using OTC analgesia prn.

## 2024-05-22 ENCOUNTER — Encounter: Payer: Self-pay | Admitting: Family Medicine

## 2024-05-22 ENCOUNTER — Ambulatory Visit: Admitting: Family Medicine

## 2024-05-22 VITALS — BP 152/80 | HR 49 | Temp 97.3°F | Ht 71.0 in | Wt 185.8 lb

## 2024-05-22 DIAGNOSIS — M47816 Spondylosis without myelopathy or radiculopathy, lumbar region: Secondary | ICD-10-CM | POA: Diagnosis not present

## 2024-05-22 DIAGNOSIS — I10 Essential (primary) hypertension: Secondary | ICD-10-CM | POA: Diagnosis not present

## 2024-05-22 DIAGNOSIS — M5412 Radiculopathy, cervical region: Secondary | ICD-10-CM | POA: Diagnosis not present

## 2024-05-22 MED ORDER — HYDROCODONE-ACETAMINOPHEN 5-325 MG PO TABS: 1.0000 | ORAL_TABLET | Freq: Four times a day (QID) | ORAL | 0 refills | Status: DC | PRN

## 2024-05-22 MED ORDER — JOURNAVX 50 MG PO TABS: 1.0000 | ORAL_TABLET | Freq: Two times a day (BID) | ORAL | 0 refills | Status: DC

## 2024-05-22 NOTE — Patient Instructions (Addendum)
 It was very nice to see you today!  I will refill your Norco.  Will also start Journavx .  Please let me know how this is working for you in a couple of weeks.  Return in about 3 months (around 08/22/2024).   Take care, Dr Kennyth  PLEASE NOTE:  If you had any lab tests, please let us  know if you have not heard back within a few days. You may see your results on mychart before we have a chance to review them but we will give you a call once they are reviewed by us .   If we ordered any referrals today, please let us  know if you have not heard from their office within the next week.   If you had any urgent prescriptions sent in today, please check with the pharmacy within an hour of our visit to make sure the prescription was transmitted appropriately.   Please try these tips to maintain a healthy lifestyle:  Eat at least 3 REAL meals and 1-2 snacks per day.  Aim for no more than 5 hours between eating.  If you eat breakfast, please do so within one hour of getting up.   Each meal should contain half fruits/vegetables, one quarter protein, and one quarter carbs (no bigger than a computer mouse)  Cut down on sweet beverages. This includes juice, soda, and sweet tea.   Drink at least 1 glass of water with each meal and aim for at least 8 glasses per day  Exercise at least 150 minutes every week.

## 2024-05-22 NOTE — Progress Notes (Signed)
   Jonathan Beasley is a 73 y.o. male who presents today for an office visit.  Assessment/Plan:  Chronic Problems Addressed Today: Lumbar spondylosis He is currently on gabapentin  600 mg 3 times daily and Norco as needed.  He is tolerating this regimen well though still has persistent pain in his low back.  He is also having more issues with cervical radiculopathy as below.  Will refill his Norco today though we did discuss non opioid alternatives as well.  He is potentially interested in trying journavx .   We discussed potential side effects and risks with journavx .  He is agreeable to try this today.  Will send prescription in.  Hopefully he will need to use less Norco if he does well with this.  He will follow-up with us  in a few weeks via MyChart.  Follow-up in 3 months for in person office visit.  Cervical radiculopathy Symptoms have worsened recently.  Was diagnosed with cervical radiculopathy 10 to 15 years ago.  Reportedly had an MRI done at that time however these records are not available for us  to review.  We did discuss having him follow back up with orthopedics or neurosurgery however he like to hold off on this for now.  He will continue his current regimen with gabapentin  600 mg 3 times daily Norco as needed.  We are also starting Journavx  as above.   Essential hypertension Mildly elevated today though typically well-controlled on HCTZ 25 mg daily and losartan  50 mg daily.  He will monitor at home and let us  know if persistently elevated.     Subjective:  HPI:  See Assessment / plan for status of chronic conditions.  Patient is here today for follow-up.  His main concern today is pain management.  Since our last visit he has been following with sports medicine for pain in his left heel.  Recently underwent extracorporal shockwave therapy for this.  He is currently on Norco for low back pain and tolerating well.  Medications help with his pain and ability to perform ADLs.  He is  interested in discussing nonopioid alternatives.      Objective:  Physical Exam: BP (!) 152/80   Pulse (!) 49   Temp (!) 97.3 F (36.3 C) (Temporal)   Ht 5' 11 (1.803 m)   Wt 185 lb 12.8 oz (84.3 kg)   SpO2 99%   BMI 25.91 kg/m   Gen: No acute distress, resting comfortably Neuro: Grossly normal, moves all extremities Psych: Normal affect and thought content      Jadee Golebiewski M. Kennyth, MD 05/22/2024 8:51 AM

## 2024-05-22 NOTE — Assessment & Plan Note (Signed)
 He is currently on gabapentin  600 mg 3 times daily and Norco as needed.  He is tolerating this regimen well though still has persistent pain in his low back.  He is also having more issues with cervical radiculopathy as below.  Will refill his Norco today though we did discuss non opioid alternatives as well.  He is potentially interested in trying journavx .   We discussed potential side effects and risks with journavx .  He is agreeable to try this today.  Will send prescription in.  Hopefully he will need to use less Norco if he does well with this.  He will follow-up with us  in a few weeks via MyChart.  Follow-up in 3 months for in person office visit.

## 2024-05-22 NOTE — Assessment & Plan Note (Signed)
 Mildly elevated today though typically well-controlled on HCTZ 25 mg daily and losartan  50 mg daily.  He will monitor at home and let us  know if persistently elevated.

## 2024-05-22 NOTE — Assessment & Plan Note (Signed)
 Symptoms have worsened recently.  Was diagnosed with cervical radiculopathy 10 to 15 years ago.  Reportedly had an MRI done at that time however these records are not available for us  to review.  We did discuss having him follow back up with orthopedics or neurosurgery however he like to hold off on this for now.  He will continue his current regimen with gabapentin  600 mg 3 times daily Norco as needed.  We are also starting Journavx  as above.

## 2024-05-23 ENCOUNTER — Other Ambulatory Visit (HOSPITAL_COMMUNITY): Payer: Self-pay

## 2024-05-23 ENCOUNTER — Ambulatory Visit: Admitting: Family Medicine

## 2024-05-23 ENCOUNTER — Telehealth: Payer: Self-pay

## 2024-05-23 NOTE — Telephone Encounter (Signed)
 Pharmacy Patient Advocate Encounter   Received notification from Onbase that prior authorization for Journavx  50MG  tablets is required/requested.   Insurance verification completed.   The patient is insured through [facility] .   Per test claim: PA required; PA submitted to above mentioned insurance via CoverMyMeds Key/confirmation #/EOC [id number] Status is pending

## 2024-05-24 ENCOUNTER — Telehealth: Payer: Self-pay | Admitting: *Deleted

## 2024-05-24 ENCOUNTER — Ambulatory Visit: Admitting: Family Medicine

## 2024-05-24 NOTE — Telephone Encounter (Signed)
 Please see message.

## 2024-05-24 NOTE — Telephone Encounter (Signed)
 Copied from CRM [id number]. Topic: Referral - Prior Authorization Question >> May 24, 2024 10:05 AM Robinson DEL wrote: Reason for CRM: [facility] prior authorization department with [employer] calling in regards to the Suzetrigine  (JOURNAVX ) 50 MG TABS, calling to obtain more clinical information.  Jasmine [employer] [phone] Reference #[id number] Deadline date 7/12 by 5:00am CST

## 2024-05-25 NOTE — Telephone Encounter (Signed)
 Additional info submitted to fax [phone]

## 2024-05-28 ENCOUNTER — Encounter: Payer: Self-pay | Admitting: Family Medicine

## 2024-05-28 ENCOUNTER — Ambulatory Visit (INDEPENDENT_AMBULATORY_CARE_PROVIDER_SITE_OTHER): Payer: Self-pay | Admitting: Family Medicine

## 2024-05-28 VITALS — Ht 71.0 in

## 2024-05-28 DIAGNOSIS — M79672 Pain in left foot: Secondary | ICD-10-CM

## 2024-05-28 NOTE — Progress Notes (Addendum)
 FOLLOW-UP VISIT:  Shockwave Therapy Procedure   NAME:  Jonathan Beasley DOB:  Dec 07, 1950 MR#: [id number] DATE OF VISIT:  05/28/2024  Encounter:   Jonathan Beasley comes in for a follow up evaluation and 4th Radial Extracorporeal Shockwave therapy (ESWT) treatment to the  Left heel.  Jonathan Beasley reports some interval change in symptoms. Slightly improved - Continues to wear shoe inserts with cut out which she is comfortable   ESWT Procedure Note: Treatment # 4  Diagnosis: Heel pain  Procedure Details  Consent for the procedure was obtained. Time out was performed. The anatomical landmarks and target areas for the procedure were identified.   PROCEDURE: ESWT was discussed with the patient in detail.  The risk and benefits were explained.  The point of maximal tenderness was identified and the oscillator probe was applied with moderate pressure. Treatment adjusted as mediated by patient feedback/pain.  Left os calcis was targeted for ESWT  Power level: 120 MJ Frequency: 16 hz Impulses: 3000 Head size: medium   Complications:  None; patient tolerated the procedure well.  ASSESSMENT/PLAN: 1. Pain of left heel Left heel pain with os calcis contusion with small bony avulsion as seen on MS[facility] ultrasound at previous visit, is improving with shockwave therapy  Plan:   ESWT completed today as noted above Return in 1 week for ESWT procedure #5 Procedure aftercare reviewed Recommended avoiding strenuous activities and using OTC analgesia prn. Continue to wear shoe insert with cut out for comfort

## 2024-05-28 NOTE — Patient Instructions (Signed)

## 2024-05-29 NOTE — Telephone Encounter (Signed)
 Pharmacy Patient Advocate Encounter  Received notification from [employer] that Prior Authorization for Journavx  50MG  tablets  has been DENIED.  Full denial letter will be uploaded to the media tab. See denial reason below.    PA #/Case ID/Reference #: [id number]

## 2024-05-29 NOTE — Telephone Encounter (Signed)
 LVM with information Rx Journavx  50MG  tablets  has been DENIED.

## 2024-06-05 ENCOUNTER — Ambulatory Visit: Admitting: Family Medicine

## 2024-06-07 ENCOUNTER — Other Ambulatory Visit: Payer: Self-pay | Admitting: Family Medicine

## 2024-06-07 ENCOUNTER — Ambulatory Visit (INDEPENDENT_AMBULATORY_CARE_PROVIDER_SITE_OTHER): Payer: Self-pay | Admitting: Family Medicine

## 2024-06-07 ENCOUNTER — Encounter: Payer: Self-pay | Admitting: Family Medicine

## 2024-06-07 VITALS — Ht 71.0 in | Wt 185.0 lb

## 2024-06-07 DIAGNOSIS — M79672 Pain in left foot: Secondary | ICD-10-CM

## 2024-06-07 NOTE — Progress Notes (Signed)
 FOLLOW-UP VISIT:  Shockwave Therapy Procedure   NAME:  Jonathan Beasley DOB:  October 07, 1951 MR#: [id number] DATE OF VISIT:  06/07/2024  Encounter:   [place] comes in for a follow up evaluation and 5th Radial Extracorporeal Shockwave therapy (ESWT) treatment to the  left heel.  [place] reports significant improvement in symptoms with previous treatments Able to run several miles earlier today No longer needing to use orthotic cutouts in his shoes    ESWT Procedure Note: Treatment #5  Diagnosis: Left heel pain  Procedure Details  Consent for the procedure was obtained. Time out was performed. The anatomical landmarks and target areas for the procedure were identified.   PROCEDURE: ESWT was discussed with the patient in detail.  The risk and benefits were explained.  The point of maximal tenderness was identified and the oscillator probe was applied with moderate pressure. Treatment adjusted as mediated by patient feedback/pain.  Left os calcis  was targeted for ESWT  Power level: 120 MJ Frequency: 16 Hz Impulses: 3000 Head size: medium   Complications:  None; patient tolerated the procedure well.  ASSESSMENT/PLAN: 1. Pain of left heel Left heel pain with os calcis contusion with small bony avulsion as seen on MSK ultrasound at previous visit, is improving with shockwave therapy  Plan:   ESWT completed today as noted above Since paint greatly improved, will defer further treatments at this time unless symptoms return.   Procedure aftercare reviewed Recommended avoiding strenuous activities and using OTC analgesia prn. Continue to wear shoe insert with cut out for comfort  Follow-up prn

## 2024-06-07 NOTE — Patient Instructions (Signed)

## 2024-06-11 ENCOUNTER — Telehealth: Admitting: Family Medicine

## 2024-06-11 VITALS — Ht 71.0 in | Wt 187.0 lb

## 2024-06-11 DIAGNOSIS — I10 Essential (primary) hypertension: Secondary | ICD-10-CM

## 2024-06-11 DIAGNOSIS — M47816 Spondylosis without myelopathy or radiculopathy, lumbar region: Secondary | ICD-10-CM | POA: Diagnosis not present

## 2024-06-11 DIAGNOSIS — G4700 Insomnia, unspecified: Secondary | ICD-10-CM

## 2024-06-11 MED ORDER — HYDROCODONE-ACETAMINOPHEN 5-325 MG PO TABS
1.0000 | ORAL_TABLET | Freq: Four times a day (QID) | ORAL | 0 refills | Status: DC | PRN
Start: 2024-06-11 — End: 2024-07-10

## 2024-06-11 NOTE — Progress Notes (Signed)
   Jonathan Beasley is a 73 y.o. male who presents today for an office visit.  Assessment/Plan:  Chronic Problems Addressed Today: Lumbar spondylosis Unfortunately has not had much benefit with journavx .  He is currently on gabapentin  600 mg 3 times daily and Norco as needed.  He did lose a few of his Norco he needs refill today.  He is aware he is not eligible for refill until tomorrow.  Will send prescription in today.  Follow-up in 3 months for in person office visit.  Database was reviewed today without red flags.  Insomnia Patient with concern for potential sleep apnea.  Was recently noted to be snoring loudly while on vacation.  Does admit to daytime somnolence as well.  We have tried several sleeping medications without much improvement.  Will place referral for sleep study today.     Subjective:  HPI:  Patient is here today for follow-up.  I saw him 3 weeks ago.  At that time we tried sending prescription in for journavx  however insurance would not pay for this. He did pick up a partial fill. Unfortunately he has not felt like it has been very effective.  He does need a refill on his Norco today.  He was recently vacation in [place] and lost several pills.  He is also concerned about potential sleep apnea.  On vacation he was told that he snores very loudly.  He has been having ongoing issues with insomnia for quite a while and has not had much success with sleeping medications.       Objective:  Physical Exam: Ht 5' 11 (1.803 m)   Wt 187 lb (84.8 kg)   BMI 26.08 kg/m   Gen: No acute distress, resting comfortably CV: Regular rate and rhythm with no murmurs appreciated Pulm: Normal work of breathing, clear to auscultation bilaterally with no crackles, wheezes, or rhonchi Neuro: Grossly normal, moves all extremities Psych: Normal affect and thought content      Taj Nevins M. Kennyth, MD 06/11/2024 2:46 PM

## 2024-06-11 NOTE — Assessment & Plan Note (Signed)
 Unfortunately has not had much benefit with journavx .  He is currently on gabapentin  600 mg 3 times daily and Norco as needed.  He did lose a few of his Norco he needs refill today.  He is aware he is not eligible for refill until tomorrow.  Will send prescription in today.  Follow-up in 3 months for in person office visit.  Database was reviewed today without red flags.

## 2024-06-11 NOTE — Assessment & Plan Note (Signed)
 Patient with concern for potential sleep apnea.  Was recently noted to be snoring loudly while on vacation.  Does admit to daytime somnolence as well.  We have tried several sleeping medications without much improvement.  Will place referral for sleep study today.

## 2024-07-03 ENCOUNTER — Ambulatory Visit (INDEPENDENT_AMBULATORY_CARE_PROVIDER_SITE_OTHER): Payer: Self-pay | Admitting: Sports Medicine

## 2024-07-03 DIAGNOSIS — M79672 Pain in left foot: Secondary | ICD-10-CM

## 2024-07-03 DIAGNOSIS — G8929 Other chronic pain: Secondary | ICD-10-CM

## 2024-07-03 NOTE — Assessment & Plan Note (Signed)
 For ESWT # 6  Generally has had a good response but flared after increased running.

## 2024-07-03 NOTE — Progress Notes (Signed)
 Indication: Left heel spurring ESWT # 6 (Including trial application)  Head size medium Impulses 3000 Power Freq 16  Patient tolerated this well with no pain.  Note he had great relief but upped his running mileage a good bit and the pain returned. Suggested a few more sessions to see if we can improve his ability to run pain free.  PATRICE Haddock, MD

## 2024-07-05 ENCOUNTER — Encounter: Payer: Self-pay | Admitting: Neurology

## 2024-07-05 ENCOUNTER — Ambulatory Visit: Payer: Self-pay | Admitting: Family Medicine

## 2024-07-05 ENCOUNTER — Ambulatory Visit: Admitting: Neurology

## 2024-07-05 VITALS — BP 147/84 | HR 62 | Ht 71.0 in | Wt 188.0 lb

## 2024-07-05 DIAGNOSIS — R351 Nocturia: Secondary | ICD-10-CM | POA: Diagnosis not present

## 2024-07-05 DIAGNOSIS — G4700 Insomnia, unspecified: Secondary | ICD-10-CM | POA: Diagnosis not present

## 2024-07-05 DIAGNOSIS — I480 Paroxysmal atrial fibrillation: Secondary | ICD-10-CM

## 2024-07-05 DIAGNOSIS — G4752 REM sleep behavior disorder: Secondary | ICD-10-CM | POA: Diagnosis not present

## 2024-07-05 DIAGNOSIS — E663 Overweight: Secondary | ICD-10-CM

## 2024-07-05 DIAGNOSIS — R0683 Snoring: Secondary | ICD-10-CM | POA: Diagnosis not present

## 2024-07-05 DIAGNOSIS — Z9189 Other specified personal risk factors, not elsewhere classified: Secondary | ICD-10-CM

## 2024-07-05 DIAGNOSIS — G2581 Restless legs syndrome: Secondary | ICD-10-CM

## 2024-07-05 DIAGNOSIS — R251 Tremor, unspecified: Secondary | ICD-10-CM

## 2024-07-05 NOTE — Progress Notes (Signed)
 Subjective:    Patient ID: Jonathan Beasley is a 73 y.o. male.  HPI    True Mar, MD, PhD [facility] [address], [address] [address] [place], [place] [address]    True Mar, MD, PhD [facility] [address] [address] [place], [place] [address]  Dear Dr. Kennyth,   I saw your patient, Jonathan Beasley, upon your kind request in my sleep clinic today for initial consultation of his sleep disorder, in particular, concern for underlying obstructive sleep apnea.  The patient is unaccompanied today.  As you know, Jonathan Beasley is a 73 year old male with an underlying medical history of allergies, arthritis, hypertension, hyperlipidemia, restless leg syndrome, low back pain, neck pain, status post low back surgery, history of paroxysmal A-fib, and overweight state, who reports loud snoring for years.  He has sleep disruption, difficulty initiating sleep.  He tried Belsomra  recently which did not help.  He is on gabapentin  for back pain and for his restless leg syndrome, he also takes hydrocodone  very occasionally.  He stopped taking Eliquis  and Crestor .  He lives with his wife.  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  He goes to bed around 11 and rise time is between 7 and 8.  It may take him a few hours to fall asleep.  He drinks caffeine in the form of soda, 1 small bottle per day.  He does not drink any alcohol.  He is a non-smoker.  He does have a history of dream enactment but not lately.  He is noted to have a left hand tremor, upon further asking, he has noticed it before and his family is aware.  He has not been officially evaluated for is hand tremor by neurologist but reports that his doctor friends have assured him that he does not have Parkinson's disease.  He is advised that if he would like for me to evaluate him for the tremor, I would be happy to see him.  Today we focused on his sleep disorder and  discussion primarily about sleep apnea in particular.  He is not aware of any family history of sleep apnea, mom is 1 years old, he does have a family history of heart disease.  He has woken up occasionally with a sense of gasping for air.  He has nocturia about 3-4 times per average night.  He denies recurrent nocturnal or morning headaches.  His Past Medical History Is Significant For: Past Medical History:  Diagnosis Date   Achilles tendonitis 07/26/2013   Allergy    Arthritis    Dysrhythmia    A-fib (onset 11/17/22)   Hyperlipidemia    Hypertension    Loose body in knee 06/02/2010   Mallet finger 08/03/2011   Restless legs    Tear of right hamstring 03/01/2017    His Past Surgical History Is Significant For: Past Surgical History:  Procedure Laterality Date   KNEE ARTHROSCOPY Right 2013   LUMBAR LAMINECTOMY/DECOMPRESSION MICRODISCECTOMY Left 11/29/2022   Procedure: Laminectomy for facet/synovial cyst - L4-L5 - left;  Surgeon: Joshua Alm RAMAN, MD;  Location: [facility] OR;  Service: Neurosurgery;  Laterality: Left;  3C    His Family History Is Significant For: Family History  Problem Relation Age of Onset   Other Mother        doesn't sleep well   Heart disease Father    Heart disease Maternal Grandfather    Heart disease Paternal  Grandfather     His Social History Is Significant For: Social History   Socioeconomic History   Marital status: Married    Spouse name: Not on file   Number of children: Not on file   Years of education: Not on file   Highest education level: Master's degree (e.g., MA, MS, MEng, MEd, MSW, MBA)  Occupational History   Occupation: MARKETING  Tobacco Use   Smoking status: Never   Smokeless tobacco: Never  Substance and Sexual Activity   Alcohol use: No   Drug use: No   Sexual activity: Yes  Other Topics Concern   Not on file  Social History Narrative   Married   Automotive engineer   Works in Economist      Caffeine: 1 mtn dew/day    Social Drivers of Corporate investment banker Strain: Low Risk  (06/11/2024)   Overall Financial Resource Strain (CARDIA)    Difficulty of Paying Living Expenses: Not hard at all  Food Insecurity: No Food Insecurity (06/11/2024)   Hunger Vital Sign    Worried About Running Out of Food in the Last Year: Never true    Ran Out of Food in the Last Year: Never true  Transportation Needs: No Transportation Needs (06/11/2024)   PRAPARE - Administrator, Civil Service (Medical): No    Lack of Transportation (Non-Medical): No  Physical Activity: Sufficiently Active (06/11/2024)   Exercise Vital Sign    Days of Exercise per Week: 7 days    Minutes of Exercise per Session: 90 min  Stress: No Stress Concern Present (06/11/2024)   [employer] of Occupational Health - Occupational Stress Questionnaire    Feeling of Stress: Not at all  Social Connections: Socially Integrated (06/11/2024)   Social Connection and Isolation Panel    Frequency of Communication with Friends and Family: Three times a week    Frequency of Social Gatherings with Friends and Family: Once a week    Attends Religious Services: More than 4 times per year    Active Member of [employer] or Organizations: Yes    Attends Banker Meetings: 1 to 4 times per year    Marital Status: Married    His Allergies Are:  Allergies  Allergen Reactions   Penicillins Other (See Comments)    Childhood- deathly ill   Shrimp (Diagnostic) Rash    Face turns red  :   His Current Medications Are:  Outpatient Encounter Medications as of 07/05/2024  Medication Sig   Ascorbic Acid (VITAMIN C) 500 MG CHEW Chew 250 mg by mouth daily.   aspirin EC 81 MG tablet Take 81 mg by mouth daily. Swallow whole.   Coenzyme Q10 (CO Q-10) 200 MG CAPS Take 200 mg by mouth at bedtime.   cyclobenzaprine  (FLEXERIL ) 10 MG tablet TAKE 1 TABLET BY MOUTH THREE TIMES A DAY AS NEEDED   gabapentin  (NEURONTIN ) 100 MG capsule TAKE 1-2 AT BEDTIME  AS NEEDED.   gabapentin  (NEURONTIN ) 300 MG capsule TAKE 2 CAPSULES BY MOUTH 3 TIMES A DAY   hydrochlorothiazide  (HYDRODIURIL ) 25 MG tablet TAKE 1 TABLET (25 MG TOTAL) BY MOUTH DAILY. (Patient taking differently: Take 25 mg by mouth daily. Takes as needed)   HYDROcodone -acetaminophen  (NORCO/VICODIN) 5-325 MG tablet Take 1 tablet by mouth every 6 (six) hours as needed for moderate pain (pain score 4-6).   Inositol Niacinate (NIACIN  FLUSH FREE) 500 MG CAPS Take 500 mg by mouth at bedtime.   losartan  (COZAAR ) 50  MG tablet TAKE 1 TABLET BY MOUTH EVERY DAY   Multiple Vitamin (MULTIVITAMIN) tablet Take 1 tablet by mouth daily.   apixaban  (ELIQUIS ) 5 MG TABS tablet Take 1 tablet (5 mg total) by mouth 2 (two) times daily. (Patient not taking: Reported on 07/05/2024)   rosuvastatin  (CRESTOR ) 20 MG tablet Take 1 tablet (20 mg total) by mouth daily. (Patient not taking: Reported on 07/05/2024)   Suvorexant  (BELSOMRA ) 10 MG TABS Take 1 tablet (10 mg total) by mouth at bedtime as needed. (Patient not taking: Reported on 07/05/2024)   Suzetrigine  (JOURNAVX ) 50 MG TABS Take 1 tablet by mouth every 12 (twelve) hours. (Patient not taking: Reported on 07/05/2024)   No facility-administered encounter medications on file as of 07/05/2024.  :   Review of Systems:  Out of a complete 14 point review of systems, all are reviewed and negative with the exception of these symptoms as listed below:  His Past Medical History Is Significant For: Past Medical History:  Diagnosis Date   Achilles tendonitis 07/26/2013   Allergy    Arthritis    Dysrhythmia    A-fib (onset 11/17/22)   Hyperlipidemia    Hypertension    Loose body in knee 06/02/2010   Mallet finger 08/03/2011   Restless legs    Tear of right hamstring 03/01/2017    His Past Surgical History Is Significant For: Past Surgical History:  Procedure Laterality Date   KNEE ARTHROSCOPY Right 2013   LUMBAR LAMINECTOMY/DECOMPRESSION MICRODISCECTOMY Left 11/29/2022    Procedure: Laminectomy for facet/synovial cyst - L4-L5 - left;  Surgeon: Joshua Alm RAMAN, MD;  Location: [facility] OR;  Service: Neurosurgery;  Laterality: Left;  3C    His Family History Is Significant For: Family History  Problem Relation Age of Onset   Other Mother        doesn't sleep well   Heart disease Father    Heart disease Maternal Grandfather    Heart disease Paternal Grandfather     His Social History Is Significant For: Social History   Socioeconomic History   Marital status: Married    Spouse name: Not on file   Number of children: Not on file   Years of education: Not on file   Highest education level: Master's degree (e.g., MA, MS, MEng, MEd, MSW, MBA)  Occupational History   Occupation: MARKETING  Tobacco Use   Smoking status: Never   Smokeless tobacco: Never  Substance and Sexual Activity   Alcohol use: No   Drug use: No   Sexual activity: Yes  Other Topics Concern   Not on file  Social History Narrative   Married   Automotive engineer   Works in Economist      Caffeine: 1 mtn dew/day   Social Drivers of Corporate investment banker Strain: Low Risk  (06/11/2024)   Overall Financial Resource Strain (CARDIA)    Difficulty of Paying Living Expenses: Not hard at all  Food Insecurity: No Food Insecurity (06/11/2024)   Hunger Vital Sign    Worried About Running Out of Food in the Last Year: Never true    Ran Out of Food in the Last Year: Never true  Transportation Needs: No Transportation Needs (06/11/2024)   PRAPARE - Administrator, Civil Service (Medical): No    Lack of Transportation (Non-Medical): No  Physical Activity: Sufficiently Active (06/11/2024)   Exercise Vital Sign    Days of Exercise per Week: 7 days    Minutes of Exercise  per Session: 90 min  Stress: No Stress Concern Present (06/11/2024)   [employer] of Occupational Health - Occupational Stress Questionnaire    Feeling of Stress: Not at all  Social Connections: Socially  Integrated (06/11/2024)   Social Connection and Isolation Panel    Frequency of Communication with Friends and Family: Three times a week    Frequency of Social Gatherings with Friends and Family: Once a week    Attends Religious Services: More than 4 times per year    Active Member of [employer] or Organizations: Yes    Attends Banker Meetings: 1 to 4 times per year    Marital Status: Married    His Allergies Are:  Allergies  Allergen Reactions   Penicillins Other (See Comments)    Childhood- deathly ill   Shrimp (Diagnostic) Rash    Face turns red  :   His Current Medications Are:  Outpatient Encounter Medications as of 07/05/2024  Medication Sig   Ascorbic Acid (VITAMIN C) 500 MG CHEW Chew 250 mg by mouth daily.   aspirin EC 81 MG tablet Take 81 mg by mouth daily. Swallow whole.   Coenzyme Q10 (CO Q-10) 200 MG CAPS Take 200 mg by mouth at bedtime.   cyclobenzaprine  (FLEXERIL ) 10 MG tablet TAKE 1 TABLET BY MOUTH THREE TIMES A DAY AS NEEDED   gabapentin  (NEURONTIN ) 100 MG capsule TAKE 1-2 AT BEDTIME AS NEEDED.   gabapentin  (NEURONTIN ) 300 MG capsule TAKE 2 CAPSULES BY MOUTH 3 TIMES A DAY   hydrochlorothiazide  (HYDRODIURIL ) 25 MG tablet TAKE 1 TABLET (25 MG TOTAL) BY MOUTH DAILY. (Patient taking differently: Take 25 mg by mouth daily. Takes as needed)   HYDROcodone -acetaminophen  (NORCO/VICODIN) 5-325 MG tablet Take 1 tablet by mouth every 6 (six) hours as needed for moderate pain (pain score 4-6).   Inositol Niacinate (NIACIN  FLUSH FREE) 500 MG CAPS Take 500 mg by mouth at bedtime.   losartan  (COZAAR ) 50 MG tablet TAKE 1 TABLET BY MOUTH EVERY DAY   Multiple Vitamin (MULTIVITAMIN) tablet Take 1 tablet by mouth daily.   apixaban  (ELIQUIS ) 5 MG TABS tablet Take 1 tablet (5 mg total) by mouth 2 (two) times daily. (Patient not taking: Reported on 07/05/2024)   rosuvastatin  (CRESTOR ) 20 MG tablet Take 1 tablet (20 mg total) by mouth daily. (Patient not taking: Reported on  07/05/2024)   Suvorexant  (BELSOMRA ) 10 MG TABS Take 1 tablet (10 mg total) by mouth at bedtime as needed. (Patient not taking: Reported on 07/05/2024)   Suzetrigine  (JOURNAVX ) 50 MG TABS Take 1 tablet by mouth every 12 (twelve) hours. (Patient not taking: Reported on 07/05/2024)   No facility-administered encounter medications on file as of 07/05/2024.  :   Review of Systems:  Out of a complete 14 point review of systems, all are reviewed and negative with the exception of these symptoms as listed below:          Review of Systems  Neurological:        Patient is here alone for sleep consult. He endorses snoring, daytime sleepiness, not sleeping well at night. He states he doesn't have any confirmed family history of sleep apnea but his mother has never slept well. He has never had a sleep study. He is on Gabapentin  for RLS. Belsomra  does not work. ESS 18 FSS 55    Objective:  Neurological Exam  Physical Exam Physical Examination:   Vitals:   07/05/24 1354  BP: (!) 147/84  Pulse: 62  General Examination: The patient is a very pleasant 73 y.o. male in no acute distress. He appears well-developed and well-nourished and well groomed.   HEENT: Normocephalic, atraumatic, pupils are equal, round and reactive to light, extraocular tracking is good without limitation to gaze excursion or nystagmus noted. Hearing is grossly intact. Face is symmetric with normal facial animation. Speech is clear with no dysarthria noted. There is no hypophonia. There is no lip, neck/head, jaw or voice tremor. Neck is supple with full range of passive and active motion. There are no carotid bruits on auscultation. Oropharynx exam reveals: mild mouth dryness, adequate dental hygiene and moderate airway crowding. Tongue protrudes centrally and palate elevates symmetrically.  Neck circumference is 16 three-quarter inches.  Chest: Clear to auscultation without wheezing, rhonchi or crackles noted.  Heart:  S1+S2+0, regular and normal without murmurs, rubs or gallops noted.   Abdomen: Soft, non-tender and non-distended.  Extremities: There is no pitting edema in the distal lower extremities bilaterally.   Skin: Warm and dry without trophic changes noted.   Musculoskeletal: exam reveals no obvious joint deformities.   Neurologically:  Mental status: The patient is awake, alert and oriented in all 4 spheres. His immediate and remote memory, attention, language skills and fund of knowledge are appropriate. There is no evidence of aphasia, agnosia, apraxia or anomia. Speech is clear with normal prosody and enunciation. Thought process is linear. Mood is normal and affect is normal.  Cranial nerves II - XII are as described above under HEENT exam.  Motor exam: Normal bulk, moving all 4 extremities, intermittent left upper extremity resting tremor noted.  Fine motor skills grossly intact, has a tendency to hold onto the left hand with the right hand.   Cerebellar testing: No dysmetria or intention tremor. There is no truncal or gait ataxia.  Sensory exam: intact to light touch in the upper and lower extremities.  Gait, station and balance: He stands without difficulty, posture is mildly stooped for age, he walks without any limp or shuffling. ted.   Assessment and Plan:   In summary, Jonathan Beasley is a very pleasant 73 y.o.-year old male with an underlying medical history of allergies, arthritis, hypertension, hyperlipidemia, restless leg syndrome, low back pain, neck pain, status post low back surgery, history of paroxysmal A-fib, and overweight state, whose history and physical exam are concerning for sleep disordered breathing, particularly obstructive sleep apnea (OSA). A laboratory attended sleep study is typically considered gold standard for evaluation of sleep disordered breathing.  He was noted to have a tremor in the left hand, he reports that he is aware of the tremor and his family is  aware of the tremor, he has not been formally evaluated.  I advised him that I would be happy to evaluate him for his tremor disorder.  I suspect mild parkinsonism but did not specifically address his tremor disorder during today's visit and we focused on his sleep disorder.  He is advised to talk to you if a formal evaluation is desired. I had a long chat with the patient about my findings and the diagnosis of sleep apnea, particularly OSA, its prognosis and treatment options. We talked about medical/conservative treatments, surgical interventions and non-pharmacological approaches for symptom control. I explained, in particular, the risks and ramifications of untreated moderate to severe OSA, especially with respect to developing cardiovascular disease down the road, including congestive heart failure (CHF), difficult to treat hypertension, cardiac arrhythmias (particularly A-fib), neurovascular complications including TIA, stroke and dementia. Even  type 2 diabetes has, in part, been linked to untreated OSA. Symptoms of untreated OSA may include (but may not be limited to) daytime sleepiness, nocturia (i.e. frequent nighttime urination), memory problems, mood irritability and suboptimally controlled or worsening mood disorder such as depression and/or anxiety, lack of energy, lack of motivation, physical discomfort, as well as recurrent headaches, especially morning or nocturnal headaches. We talked about the importance of maintaining a healthy lifestyle and striving for healthy weight.  I recommended a sleep study at this time. I outlined the differences between a laboratory attended sleep study which is considered more comprehensive and accurate over the option of a home sleep test (HST); the latter may lead to underestimation of sleep disordered breathing in some instances and does not help with diagnosing upper airway resistance syndrome and is not accurate enough to diagnose primary central sleep apnea  typically. I outlined possible surgical and non-surgical treatment options of OSA, including the use of a positive airway pressure (PAP) device (i.e. CPAP, AutoPAP/APAP or BiPAP in certain circumstances), a custom-made dental device (aka oral appliance, which would require a referral to a specialist dentist or orthodontist typically, and is generally speaking not considered for patients with full dentures or edentulous state), upper airway surgical options, such as traditional UPPP (which is not considered a first-line treatment) or the Inspire device (hypoglossal nerve stimulator, which would involve a referral for consultation with an ENT surgeon, after careful selection, following inclusion criteria - also not first-line treatment). I explained the PAP treatment option to the patient in detail, as this is generally considered first-line treatment.  The patient indicated that he would be willing to try PAP therapy, if the need arises. I explained the importance of being compliant with PAP treatment, not only for insurance purposes but primarily to improve patient's symptoms symptoms, and for the patient's long term health benefit, including to reduce His cardiovascular risks longer-term.    We will pick up our discussion about the next steps and treatment options after testing.  We will keep him posted as to the test results by phone call and/or MyChart messaging where possible.  We will plan to follow-up in sleep clinic accordingly as well.  I answered all his questions today and the patient was in agreement.   I encouraged him to call with any interim questions, concerns, problems or updates or email us  through MyChart.  Generally speaking, sleep test authorizations may take up to 2 weeks, sometimes less, sometimes longer, the patient is encouraged to get in touch with us  if they do not hear back from the sleep lab staff directly within the next 2 weeks.  Thank you very much for allowing me to participate  in the care of this nice patient. If I can be of any further assistance to you please do not hesitate to call me at [phone].  Sincerely,   True Mar, MD, PhD

## 2024-07-09 ENCOUNTER — Ambulatory Visit: Payer: Self-pay | Admitting: Family Medicine

## 2024-07-09 ENCOUNTER — Encounter: Payer: Self-pay | Admitting: Family Medicine

## 2024-07-09 DIAGNOSIS — M79672 Pain in left foot: Secondary | ICD-10-CM

## 2024-07-09 NOTE — Patient Instructions (Signed)

## 2024-07-09 NOTE — Progress Notes (Signed)
 FOLLOW-UP VISIT:  Shockwave Therapy Procedure   NAME:  Jonathan Beasley DOB:  05/12/1951 MR#: [id number] DATE OF VISIT:  07/09/2024  Encounter:   Jonathan Beasley comes in for a follow up evaluation and 7th Radial Extracorporeal Shockwave therapy (ESWT) treatment to the  left heel.  [name] reports some improvement after last session, feels good the day of treatment, bu then pain returns Has still been running Going to [facility]  Open later this week   ESWT Procedure Note: Treatment #7  Diagnosis: Lt heel pain  Procedure Details  Consent for the procedure was obtained. Time out was performed. The anatomical landmarks and target areas for the procedure were identified.   PROCEDURE: ESWT was discussed with the patient in detail.  The risk and benefits were explained.  The point of maximal tenderness was identified and the oscillator probe was applied with moderate pressure. Treatment adjusted as mediated by patient feedback/pain.  Left os calcis was targeted for ESWT  Power level: 120 MJ Frequency: 16 Hz Impulses: 3000 Head size: medium   Complications:  None; patient tolerated the procedure well.  ASSESSMENT/PLAN: 1. Pain of left heel Left heel pain with os calcis contusion with small bony avulsion as seen on MS[facility] ultrasound at previous visit, improving, but still with pain  Plan:   ESWT completed today as noted above Return in 1 week for ESWT procedure #8.  Dr Harvey anticipates possible 10-12 sessions for improvement Procedure aftercare reviewed Recommended avoiding strenuous activities and using OTC analgesia prn.

## 2024-07-10 ENCOUNTER — Ambulatory Visit: Admitting: Family Medicine

## 2024-07-10 ENCOUNTER — Encounter: Payer: Self-pay | Admitting: Family Medicine

## 2024-07-10 VITALS — BP 142/96 | HR 51 | Temp 97.7°F | Ht 71.0 in | Wt 188.6 lb

## 2024-07-10 DIAGNOSIS — M5416 Radiculopathy, lumbar region: Secondary | ICD-10-CM

## 2024-07-10 DIAGNOSIS — M47816 Spondylosis without myelopathy or radiculopathy, lumbar region: Secondary | ICD-10-CM

## 2024-07-10 DIAGNOSIS — I10 Essential (primary) hypertension: Secondary | ICD-10-CM | POA: Diagnosis not present

## 2024-07-10 DIAGNOSIS — G4733 Obstructive sleep apnea (adult) (pediatric): Secondary | ICD-10-CM | POA: Diagnosis not present

## 2024-07-10 DIAGNOSIS — R251 Tremor, unspecified: Secondary | ICD-10-CM | POA: Insufficient documentation

## 2024-07-10 MED ORDER — GABAPENTIN 300 MG PO CAPS: 600.0000 mg | ORAL_CAPSULE | Freq: Three times a day (TID) | ORAL | 2 refills | Status: DC

## 2024-07-10 MED ORDER — HYDROCODONE-ACETAMINOPHEN 5-325 MG PO TABS: 1.0000 | ORAL_TABLET | Freq: Four times a day (QID) | ORAL | 0 refills | Status: DC | PRN

## 2024-07-10 NOTE — Assessment & Plan Note (Signed)
 Pain overall manageable on current regimen gabapentin  600 mg 3 times daily Norco as needed.  Tolerates this regimen well without side effects.  Database without red flags.  No significant side effects.  Medications do help with pain and ability to perform activities of daily living.  Follow-up in 3 months.

## 2024-07-10 NOTE — Assessment & Plan Note (Signed)
 Recently saw neurology for sleep evaluation. Tremor was noted at that visit and he was told that may have mild parkinson. Will place formal referral to neurology for further evaluation.

## 2024-07-10 NOTE — Progress Notes (Signed)
   Jonathan Beasley is a 73 y.o. male who presents today for an office visit.  Assessment/Plan:  Chronic Problems Addressed Today: Tremor Recently saw neurology for sleep evaluation. Tremor was noted at that visit and he was told that may have mild parkinson. Will place formal referral to neurology for further evaluation.   OSA (obstructive sleep apnea) Recently diagnosed with OSA. Following with sleep medicine for this at [facility].   Essential hypertension Mildly elevated today.  Typically well-controlled.  Well-controlled at home.  Will continue regimen losartan  50 mg daily and HCTZ 50 mg daily.  He will monitor at home and let us  know if persistently elevated.  Lumbar spondylosis Pain overall manageable on current regimen gabapentin  600 mg 3 times daily Norco as needed.  Tolerates this regimen well without side effects.  Database without red flags.  No significant side effects.  Medications do help with pain and ability to perform activities of daily living.  Follow-up in 3 months.     Subjective:  HPI:  See assessment / plan for status of chronic conditions.   Discussed the use of AI scribe software for clinical note transcription with the patient, who gave verbal consent to proceed.  History of Present Illness Jonathan Beasley is a 73 year old male who presents for follow-up regarding sleep issues and potential Parkinson's disease evaluation.  He attended a sleep clinic last Thursday, which took longer to arrange than expected due to delayed mail correspondence and follow-up call. During the visit, a neurologist noted shaking and questioned if he had been evaluated for Parkinson's disease. He attributes the shaking to a past neck injury involving C4, C5, and C6, caused by lifting something heavy. He has not undergone surgery due to concerns about potential complications observed in friends who had similar procedures. Physical therapy provided some  relief.  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 He has not been formally diagnosed with Parkinson's but is considering further evaluation.  He has been attempting to modify his diet and is interested in checking his blood work to assess any changes. He has been following this new diet for about a month, focusing on eating healthier foods like kale, cucumbers, and broccoli, and reducing his intake of chips and cookies.  He is planning a [place] to [place] with [place] and daughter, which coincides with the timing of his Norco refill. He expresses concern about obtaining his medication while traveling and requests a refill before departure. He also mentions using Gabapentin  300 mg, which he has recently run out of, and is unsure about the refill process through his pharmacy.         Objective:  Physical Exam: BP (!) 142/96 (BP Location: Right Arm, Patient Position: Sitting, Cuff Size: Normal)   Pulse (!) 51   Temp 97.7 F (36.5 C)   Ht 5' 11 (1.803 m)   Wt 188 lb 9.6 oz (85.5 kg)   SpO2 98%   BMI 26.30 kg/m   Gen: No acute distress, resting comfortably  Neuro: Grossly normal, moves all extremities Psych: Normal affect and thought content      Shavonn Convey M. Kennyth, MD 07/10/2024 9:21 AM

## 2024-07-10 NOTE — Assessment & Plan Note (Signed)
 Recently diagnosed with OSA. Following with sleep medicine for this at [facility].

## 2024-07-10 NOTE — Assessment & Plan Note (Signed)
 Mildly elevated today.  Typically well-controlled.  Well-controlled at home.  Will continue regimen losartan  50 mg daily and HCTZ 50 mg daily.  He will monitor at home and let us  know if persistently elevated.

## 2024-07-10 NOTE — Patient Instructions (Signed)
 It was very nice to see you today!  VISIT SUMMARY: Today, we discussed your sleep issues, potential Parkinson's disease evaluation, and other health concerns. You recently visited a sleep clinic and are considering further evaluation for Parkinson's disease due to a noted tremor. We also reviewed your dietary changes and medication needs for your upcoming trip.  YOUR PLAN: TREMOR: A tremor was noted by the neurologist, and there is a possibility of Parkinson's disease due to your history of DDT exposure and the tremor itself. -We will refer you to a neurologist for further evaluation of the tremor and possible Parkinson's disease.  OBSTRUCTIVE SLEEP APNEA: You visited the sleep clinic and saw Dr. Buck, with a neurologist involved in your care. -Continue following the recommendations from the sleep clinic.  ESSENTIAL HYPERTENSION: Your blood pressure is being monitored and was checked at the sleep clinic. It is within a decent range, though there is a history of elevated readings. -We will recheck your blood pressure during today's visit.   GENERAL HEALTH MAINTENANCE: You are making dietary changes to improve your health. The timeline for changes in blood work is generally 6-12 months. -Continue with your healthy eating habits, focusing on foods like kale, cucumbers, and broccoli, and reducing chips and cookies.  FOLLOW-UP: You are planning a trip to [place] and need medication refills before traveling. -We will refill your Norco and Gabapentin  300 mg prescriptions before your trip.  No follow-ups on file.   Take care, Dr Kennyth  PLEASE NOTE:  If you had any lab tests, please let us  know if you have not heard back within a few days. You may see your results on mychart before we have a chance to review them but we will give you a call once they are reviewed by us .   If we ordered any referrals today, please let us  know if you have not heard from their office within the next week.    If you had any urgent prescriptions sent in today, please check with the pharmacy within an hour of our visit to make sure the prescription was transmitted appropriately.   Please try these tips to maintain a healthy lifestyle:  Eat at least 3 REAL meals and 1-2 snacks per day.  Aim for no more than 5 hours between eating.  If you eat breakfast, please do so within one hour of getting up.   Each meal should contain half fruits/vegetables, one quarter protein, and one quarter carbs (no bigger than a computer mouse)  Cut down on sweet beverages. This includes juice, soda, and sweet tea.   Drink at least 1 glass of water with each meal and aim for at least 8 glasses per day  Exercise at least 150 minutes every week.

## 2024-07-11 ENCOUNTER — Telehealth: Payer: Self-pay | Admitting: Neurology

## 2024-07-11 NOTE — Telephone Encounter (Signed)
 I spoke with the patient.   [facility] [employer] medicare no auth req.  Patient is scheduled at [facility] for 08/07/24 at 9 pm.  Mailed packet and sent mychart.

## 2024-07-19 ENCOUNTER — Encounter: Payer: Self-pay | Admitting: Family Medicine

## 2024-07-19 ENCOUNTER — Ambulatory Visit (INDEPENDENT_AMBULATORY_CARE_PROVIDER_SITE_OTHER): Payer: Self-pay | Admitting: Family Medicine

## 2024-07-19 VITALS — Ht 71.0 in

## 2024-07-19 DIAGNOSIS — M79672 Pain in left foot: Secondary | ICD-10-CM

## 2024-07-19 NOTE — Patient Instructions (Signed)

## 2024-07-19 NOTE — Progress Notes (Signed)
 FOLLOW-UP VISIT:  Shockwave Therapy Procedure   NAME:  Jonathan Beasley DOB:  08/29/1951 MR#: [id number] DATE OF VISIT:  07/19/2024  Encounter:   Valdez comes in for a follow up evaluation and 8th radial Extracorporeal Shockwave therapy (ESWT) treatment to the left heel.  Kostantinos reports no significant change in symptoms since last session Has continued to [place] in [facility], had some pain   ESWT Procedure Note: Treatment #8  Diagnosis: Left heel pain  Procedure Details  Consent for the procedure was obtained. Time out was performed. The anatomical landmarks and target areas for the procedure were identified.   PROCEDURE: ESWT was discussed with the patient in detail.  The risk and benefits were explained.  The point of maximal tenderness was identified and the oscillator probe was applied with moderate pressure. Treatment adjusted as mediated by patient feedback/pain.  Left os calcis was targeted for ESWT  Power level: 120 MJ Frequency: 16 hz Impulses: 3000 Head size: Medium   Complications:  None; patient tolerated the procedure well.  ASSESSMENT/PLAN: 1. Pain of left heel Left heel pain with os calcis contusion with small bony avulsion as seen on MSK ultrasound at a previous visit  Plan:   ESWT completed today as noted above Return in 1 week for ESWT procedure #9 - Dr Harvey anticipates a total of 10-12 sessions for improvement Procedure aftercare reviewed Recommended avoiding strenuous activities and using OTC analgesia prn.

## 2024-07-24 ENCOUNTER — Other Ambulatory Visit: Payer: Self-pay

## 2024-07-24 ENCOUNTER — Ambulatory Visit (INDEPENDENT_AMBULATORY_CARE_PROVIDER_SITE_OTHER): Payer: Self-pay | Admitting: Family Medicine

## 2024-07-24 ENCOUNTER — Encounter: Payer: Self-pay | Admitting: Family Medicine

## 2024-07-24 DIAGNOSIS — M79672 Pain in left foot: Secondary | ICD-10-CM

## 2024-07-24 NOTE — Progress Notes (Signed)
 FOLLOW-UP VISIT:  Shockwave Therapy Procedure   NAME:  Jonathan Beasley DOB:  07-Oct-1951 MR#: [id number] DATE OF VISIT:  07/24/2024  Encounter:   Jonathan Beasley comes in for a follow up evaluation and 9th Radial Extracorporeal Shockwave therapy (ESWT) treatment to the  left heel.  Jonathan Beasley reports he has not had a change in his pain over the past week since his last session.  He has continued to run on the heel with some success.    ESWT Procedure Note: Treatment #9  Diagnosis: left heel pain  Procedure Details  Consent for the procedure was obtained. Time out was performed. The anatomical landmarks and target areas for the procedure were identified.   PROCEDURE: ESWT was discussed with the patient in detail.  The risk and benefits were explained.  The point of maximal tenderness was identified and the oscillator probe was applied with moderate pressure. Treatment adjusted as mediated by patient feedback/pain.  Left os calcis was targeted for ESWT  Power level: 120 MJ Frequency: 16 Hz Impulses: 3000 Head size: Medium   Complications:  None; patient tolerated the procedure well.  Repeat Limited foot ultrasound: Previously seen small bony avulsion at the os calcis visualized without interval changes.  In contrast to prior imaging, this did not elicit pain on palpation with ultrasound.  Plantar fascia normal measured at diameter of 0.32 cm. Impression: Stable small bony avulsion Assessment & Plan Pain of left heel Plan:   ESWT completed today as noted above Return in 1 week for ESWT procedure #10 Procedure aftercare reviewed Recommended avoiding strenuous activities and using OTC analgesia prn.  Ozell Provencal, MD, PGY-3 [facility] 4:44 PM 07/24/2024

## 2024-07-24 NOTE — Patient Instructions (Signed)

## 2024-08-02 ENCOUNTER — Ambulatory Visit (INDEPENDENT_AMBULATORY_CARE_PROVIDER_SITE_OTHER): Payer: Self-pay | Admitting: Sports Medicine

## 2024-08-02 ENCOUNTER — Encounter: Payer: Self-pay | Admitting: Sports Medicine

## 2024-08-02 DIAGNOSIS — G8929 Other chronic pain: Secondary | ICD-10-CM

## 2024-08-02 DIAGNOSIS — M79672 Pain in left foot: Secondary | ICD-10-CM

## 2024-08-02 NOTE — Progress Notes (Signed)
 FOLLOW-UP VISIT:  Shockwave Therapy Procedure   NAME:  Nochum Fenter DOB:  12/26/1950 MR#: [id number] DATE OF VISIT:  08/02/2024  Encounter:   Josealberto comes in for a follow up evaluation and 10th Radial Extracorporeal Shockwave therapy (ESWT) treatment to the  left heel.  Merel reports mild interval improvement in symptoms.  He remains active with running and home exercises.    ESWT Procedure Note: Treatment #10  Diagnosis: left heel spurring  Procedure Details  Consent for the procedure was obtained. Time out was performed. The anatomical landmarks and target areas for the procedure were identified.   PROCEDURE: ESWT was discussed with the patient in detail.  The risk and benefits were explained.  The point of maximal tenderness was identified and the oscillator probe was applied with moderate pressure. Treatment adjusted as mediated by patient feedback/pain.  The left os calcis was targeted for ESWT  Power level: 120 MJ Frequency: 16 Hz Impulses: 3000 Head size: Medium   Complications:  None; patient tolerated the procedure well.  Chronic pain of left heel  Plan:   ESWT completed today as noted above Return in 1 week for ESWT procedure #11 as he feels ESWT is gradually improving his discomfort. Review of recent imaging supports this as well.  Procedure aftercare reviewed Recommended avoiding strenuous activities and using OTC analgesia prn.

## 2024-08-04 ENCOUNTER — Other Ambulatory Visit: Payer: Self-pay | Admitting: Family Medicine

## 2024-08-06 ENCOUNTER — Encounter: Payer: Self-pay | Admitting: Family Medicine

## 2024-08-07 ENCOUNTER — Ambulatory Visit: Admitting: Neurology

## 2024-08-07 DIAGNOSIS — R0683 Snoring: Secondary | ICD-10-CM

## 2024-08-07 DIAGNOSIS — I480 Paroxysmal atrial fibrillation: Secondary | ICD-10-CM

## 2024-08-07 DIAGNOSIS — R9431 Abnormal electrocardiogram [ECG] [EKG]: Secondary | ICD-10-CM

## 2024-08-07 DIAGNOSIS — Z9189 Other specified personal risk factors, not elsewhere classified: Secondary | ICD-10-CM

## 2024-08-07 DIAGNOSIS — R351 Nocturia: Secondary | ICD-10-CM

## 2024-08-07 DIAGNOSIS — G4720 Circadian rhythm sleep disorder, unspecified type: Secondary | ICD-10-CM

## 2024-08-07 DIAGNOSIS — G4700 Insomnia, unspecified: Secondary | ICD-10-CM

## 2024-08-07 DIAGNOSIS — G2581 Restless legs syndrome: Secondary | ICD-10-CM

## 2024-08-07 DIAGNOSIS — G4752 REM sleep behavior disorder: Secondary | ICD-10-CM

## 2024-08-07 DIAGNOSIS — G4761 Periodic limb movement disorder: Secondary | ICD-10-CM

## 2024-08-07 DIAGNOSIS — E663 Overweight: Secondary | ICD-10-CM

## 2024-08-07 DIAGNOSIS — G4733 Obstructive sleep apnea (adult) (pediatric): Secondary | ICD-10-CM

## 2024-08-07 MED ORDER — HYDROCODONE-ACETAMINOPHEN 5-325 MG PO TABS: 1.0000 | ORAL_TABLET | Freq: Four times a day (QID) | ORAL | 0 refills | Status: DC | PRN

## 2024-08-09 ENCOUNTER — Ambulatory Visit (INDEPENDENT_AMBULATORY_CARE_PROVIDER_SITE_OTHER): Payer: Self-pay | Admitting: Sports Medicine

## 2024-08-09 DIAGNOSIS — G8929 Other chronic pain: Secondary | ICD-10-CM

## 2024-08-09 DIAGNOSIS — M79672 Pain in left foot: Secondary | ICD-10-CM

## 2024-08-09 NOTE — Assessment & Plan Note (Signed)
 Continues to get some improvement post ESWT  Plan session 12 nest week and then a break to see if benefit persists

## 2024-08-09 NOTE — Progress Notes (Signed)
 ESWT # 11  Patient has continued to have less symptoms of heel burning and pain with the weekly ESWT.  He definitely has less of the pain and the burning sensation at his heel responds to vasoline coating the heel .  Location: left heel at plantar surface across entire calcaneus Head size medium Impusles 3000 Power level 120 mJ Frequency 16  Patient tolerated the procedure well.  He plans to do a 12 th session next week and then try taking a break and seeing if the benefit lasts.  PATRICE Haddock, MD

## 2024-08-14 ENCOUNTER — Encounter: Payer: Self-pay | Admitting: Family Medicine

## 2024-08-14 ENCOUNTER — Ambulatory Visit (INDEPENDENT_AMBULATORY_CARE_PROVIDER_SITE_OTHER): Payer: Self-pay | Admitting: Family Medicine

## 2024-08-14 DIAGNOSIS — M79672 Pain in left foot: Secondary | ICD-10-CM

## 2024-08-14 NOTE — Progress Notes (Signed)
 FOLLOW-UP VISIT:  Shockwave Therapy Procedure   NAME:  Jonathan Beasley DOB:  12/16/1950 MR#: [id number] DATE OF VISIT:  08/14/2024  Encounter:   Jonathan Beasley comes in for a follow up evaluation and 12th Radial Extracorporeal Shockwave therapy (ESWT) treatment to the  left heel.  Jonathan Beasley reports limitted interval change in symptoms.    ESWT Procedure Note: Treatment #12  Diagnosis: Lt heel pain  Procedure Details  Consent for the procedure was obtained. Time out was performed. The anatomical landmarks and target areas for the procedure were identified.   PROCEDURE: ESWT was discussed with the patient in detail.  The risk and benefits were explained.  The point of maximal tenderness was identified and the oscillator probe was applied with moderate pressure. Treatment adjusted as mediated by patient feedback/pain.  Left os calcis  was targeted for ESWT  Power level: 120 MJ Frequency: 16 Hz Impulses: 3000 Head size: medium   Complications:  None; patient tolerated the procedure well.  ASSESSMENT/PLAN: 1. Pain of left heel  Plan:   ESWT completed today as noted above Return in 3-4 weeks to assess progress, no further ESWT procedure planned at this time Procedure aftercare reviewed Recommended avoiding strenuous activities and using OTC analgesia prn.

## 2024-08-14 NOTE — Procedures (Unsigned)
 Physician Interpretation: Please see link under Procedure Tab or under Encounters tab for physician report, technical report, as well as O2 titration and/or PAP titration tables (if applicable).   Referred by: Dr. Modena Callander   History and Indication for Testing (obtained from visit note dated 05/17/2024): 73 year old male with an underlying complex medical history of stroke in May 2025, hypertension, hyperlipidemia, coronary artery disease, prior smoking, COPD, history of kidney stone, carotid artery disease with status post bilateral carotid endarterectomies, hypothyroidism, and overweight state, who reports snoring and nocturia. His Epworth sleepiness score is 3 out of 24, fatigue severity score is 15 out of 63.     Review of the EEG showed no abnormal electrical discharges and symmetrical bihemispheric findings.     EKG: The EKG revealed normal sinus rhythm (NSR). ***   AUDIO/VIDEO REVIEW: The audio and video review did not show any abnormal or unusual behaviors, movements, phonations or vocalizations. The patient took *** restroom breaks. Snoring was noted, ***   POST-STUDY QUESTIONNAIRE: Post study, the patient indicated, that sleep was *** the same as usual.    IMPRESSION:    Obstructive Sleep Apnea (OSA), *** ***Central Sleep Apnea (CSA) ***Primary Snoring ***Primary Central Sleep Apnea ***Complex Sleep Apnea ***PLMD (periodic limb movement disorder [of sleep]) ***Dysfunctions associated with sleep stages or arousal from sleep ***Non-specific abnormal electrocardiogram (EKG) ***Poor sleep pattern ***Inconclusive Test   RECOMMENDATIONS:         I certify that I have reviewed the entire raw data recording prior to the issuance of this report in accordance with the Standards of Accreditation of the American Academy of Sleep Medicine (AASM).   True Mar, MD, PhD Medical Director, [facility]) Diplomat, ABPN (Neurology and  Sleep)

## 2024-08-14 NOTE — Patient Instructions (Signed)

## 2024-08-15 ENCOUNTER — Ambulatory Visit: Payer: Self-pay | Admitting: Neurology

## 2024-08-15 DIAGNOSIS — G4733 Obstructive sleep apnea (adult) (pediatric): Secondary | ICD-10-CM

## 2024-08-16 NOTE — Telephone Encounter (Signed)
LMVM for pt to return call for results.  ?

## 2024-08-16 NOTE — Telephone Encounter (Signed)
-----   Message from True Mar sent at 08/15/2024  5:33 PM EDT ----- Urgent set up requested on PAP therapy, due to severe OSA. Patient referred by PCP, seen by me on 07/05/24, patient had a split night sleep study on 08/07/24. Please call and notify patient that the recent sleep study showed severe obstructive sleep apnea (OSA). He did well with CPAP during the study with significant improvement of the respiratory events.  I would like start the patient on a  CPAP machine for home use. I placed the order in the chart.  Also, his EKG during the sleep study showed a fib consistently. This is not a new finding, but I wanted to let him know.  Please advise patient that we will need a follow up appointment with either myself or one of our nurse practitioners in about 2-3 months post set-up to check for how they are doing on treatment and  how well it's going with the machine in general. Most insurance company require a certain compliance percentage to continue to cover/pay for the machine. Please ask patient to schedule this FU  appointment, according to the set-up date, which is the day they receive the machine. Please make sure, the patient understands the importance of keeping this window for the FU appointment, as it is  often an Barista and not our rule. Failing to adhere to this may result in losing coverage for sleep apnea treatment, at which point some insurances require repeating the whole process.  Plus, monitoring compliance data is usually good feedback for the patient as far as how they are doing, how many hours they are on it, how well the mask fits, etc.  Also remind patient, that any PAP machine or mask issues should be first addressed with the DME company, who provided the machine/supplies.  Please ask if patient has a preference regarding DME company, may depend on the insurance too.  True Mar, MD, PhD [facility])   ----- Message ----- From:  Rebecka Fleeta Higashi In One Three One Sent: 08/15/2024   5:29 PM EDT To: True Mar, MD

## 2024-08-30 ENCOUNTER — Encounter: Payer: Self-pay | Admitting: Neurology

## 2024-08-30 ENCOUNTER — Ambulatory Visit: Admitting: Neurology

## 2024-08-30 VITALS — BP 135/89 | HR 59 | Ht 71.0 in | Wt 181.6 lb

## 2024-08-30 DIAGNOSIS — R29818 Other symptoms and signs involving the nervous system: Secondary | ICD-10-CM | POA: Diagnosis not present

## 2024-08-30 DIAGNOSIS — G4733 Obstructive sleep apnea (adult) (pediatric): Secondary | ICD-10-CM | POA: Diagnosis not present

## 2024-08-30 DIAGNOSIS — R251 Tremor, unspecified: Secondary | ICD-10-CM

## 2024-08-30 NOTE — Progress Notes (Signed)
 Subjective:    Patient ID: Jonathan Beasley is a 73 y.o. male.  HPI    True Mar, MD, PhD [facility] [address] [address] [place], [place] [address]  Dear Dr. Kennyth,  I saw your patient, Jonathan Beasley, upon your kind request in my neurologic clinic today for evaluation of his tremor.  I had recently evaluated him for sleep apnea concern and noted a tremor at the time.  As you know, Jonathan Beasley is a 73 year old male with an underlying medical history of allergies, arthritis, hypertension, hyperlipidemia, restless leg syndrome, low back pain, tremor, neck pain, status post low back surgery, overweight state, paroxysmal A-fib, and recent diagnosis of severe obstructive sleep apnea, who reports a several year history of tremor affecting primarily his left hand.  Tremor has been ongoing for years, primarily on the left side, it has become worse over time.  He has chronic neck pain.  He never had neck surgery but it was recommended in the past.  He did have lumbar laminectomy in January 2024 with microdiscectomy under Dr. Alm Beasley.  He also had arthroscopic right knee surgery in 2013.   He exercises on a regular basis, about 4 days a week.  He has a history of restless leg syndrome and took ropinirole  in the distant past, currently on gabapentin  300 mg twice daily.  He sees cardiology on a regular basis.  He is keen on starting his PAP machine and hoping to sleep better with it.  No family history of tremor or Parkinson's disease.  [place] and had exposure to DDT, he reports.  He recently had a split-night sleep study on 08/07/2024 which showed severe obstructive sleep apnea with a baseline AHI of 45.8/h, O2 nadir 85% with absence of REM sleep during the baseline portion of the study which likely rendered an underestimation of his sleep disordered breathing at baseline.  He did well with CPAP of 10 cm but had some residual sleep disordered  breathing on this setting.  I recommended home CPAP therapy at a pressure of 11 cm via medium nasal mask and EPR of 2.  He has not been set up with home CPAP yet. We will message his DME.   Previously:   His Past Medical History Is Significant For: Past Medical History:  Diagnosis Date   Achilles tendonitis 07/26/2013   Allergy    Arthritis    Dysrhythmia    A-fib (onset 11/17/22)   Hyperlipidemia    Hypertension    Loose body in knee 06/02/2010   Mallet finger 08/03/2011   Restless legs    Tear of right hamstring 03/01/2017    His Past Surgical History Is Significant For: Past Surgical History:  Procedure Laterality Date   KNEE ARTHROSCOPY Right 11/16/2011   LUMBAR LAMINECTOMY/DECOMPRESSION MICRODISCECTOMY Left 11/29/2022   Procedure: Laminectomy for facet/synovial cyst - L4-L5 - left;  Surgeon: Beasley Alm RAMAN, MD;  Location: [facility];  Service: Neurosurgery;  Laterality: Left;  3C   SPINE SURGERY  11/29/2022    His Family History Is Significant For: Family History  Problem Relation Age of Onset   Other Mother        doesn't sleep well   Heart disease Father    Heart disease Maternal Grandfather    Heart disease Paternal Grandfather    Parkinson's disease Neg Hx     His Social History Is Significant For: Social History   Socioeconomic History  Marital status: Married    Spouse name: Not on file   Number of children: Not on file   Years of education: Not on file   Highest education level: Master's degree (e.g., MA, MS, MEng, MEd, MSW, MBA)  Occupational History   Occupation: MARKETING  Tobacco Use   Smoking status: Never   Smokeless tobacco: Never  Vaping Use   Vaping status: Never Used  Substance and Sexual Activity   Alcohol use: No   Drug use: No   Sexual activity: Yes  Other Topics Concern   Not on file  Social History Narrative   Married   Automotive engineer   Works in Economist      Caffeine: 1 mtn dew/day   Social Drivers of Manufacturing engineer Strain: Low Risk  (06/11/2024)   Overall Financial Resource Strain (CARDIA)    Difficulty of Paying Living Expenses: Not hard at all  Food Insecurity: No Food Insecurity (06/11/2024)   Hunger Vital Sign    Worried About Running Out of Food in the Last Year: Never true    Ran Out of Food in the Last Year: Never true  Transportation Needs: No Transportation Needs (06/11/2024)   PRAPARE - Administrator, Civil Service (Medical): No    Lack of Transportation (Non-Medical): No  Physical Activity: Sufficiently Active (06/11/2024)   Exercise Vital Sign    Days of Exercise per Week: 7 days    Minutes of Exercise per Session: 90 min  Stress: No Stress Concern Present (06/11/2024)   [employer] of Occupational Health - Occupational Stress Questionnaire    Feeling of Stress: Not at all  Social Connections: Socially Integrated (06/11/2024)   Social Connection and Isolation Panel    Frequency of Communication with Friends and Family: Three times a week    Frequency of Social Gatherings with Friends and Family: Once a week    Attends Religious Services: More than 4 times per year    Active Member of [employer] or Organizations: Yes    Attends Banker Meetings: 1 to 4 times per year    Marital Status: Married    His Allergies Are:  Allergies  Allergen Reactions   Penicillins Other (See Comments)    Childhood- deathly ill   Shrimp (Diagnostic) Rash    Face turns red  :   His Current Medications Are:  Outpatient Encounter Medications as of 08/30/2024  Medication Sig   Ascorbic Acid (VITAMIN C) 500 MG CHEW Chew 250 mg by mouth daily.   aspirin EC 81 MG tablet Take 81 mg by mouth daily. Swallow whole.   Coenzyme Q10 (CO Q-10) 200 MG CAPS Take 200 mg by mouth at bedtime.   cyclobenzaprine  (FLEXERIL ) 10 MG tablet TAKE 1 TABLET BY MOUTH THREE TIMES A DAY AS NEEDED   gabapentin  (NEURONTIN ) 100 MG capsule TAKE 1-2 AT BEDTIME AS NEEDED. (Patient taking  differently: as needed. TAKE 1-2 AT BEDTIME AS NEEDED.)   gabapentin  (NEURONTIN ) 300 MG capsule Take 2 capsules (600 mg total) by mouth 3 (three) times daily.   hydrochlorothiazide  (HYDRODIURIL ) 25 MG tablet TAKE 1 TABLET (25 MG TOTAL) BY MOUTH DAILY.   HYDROcodone -acetaminophen  (NORCO/VICODIN) 5-325 MG tablet Take 1 tablet by mouth every 6 (six) hours as needed for moderate pain (pain score 4-6).   Inositol Niacinate (NIACIN  FLUSH FREE) 500 MG CAPS Take 500 mg by mouth at bedtime.   losartan  (COZAAR ) 50 MG tablet TAKE 1 TABLET BY MOUTH EVERY  DAY   Multiple Vitamin (MULTIVITAMIN) tablet Take 1 tablet by mouth daily.   apixaban  (ELIQUIS ) 5 MG TABS tablet Take 1 tablet (5 mg total) by mouth 2 (two) times daily. (Patient not taking: Reported on 07/05/2024)   rosuvastatin  (CRESTOR ) 20 MG tablet Take 1 tablet (20 mg total) by mouth daily. (Patient not taking: Reported on 07/10/2024)   No facility-administered encounter medications on file as of 08/30/2024.  :   Review of Systems:  Out of a complete 14 point review of systems, all are reviewed and negative with the exception of these symptoms as listed below:  Review of Systems  Neurological:        Pt here for tremors Pt states tremors in both arms and hands     Objective:  Neurological Exam  Physical Exam Physical Examination:   Vitals:   08/30/24 1437  BP: 135/89  Pulse: (!) 59    General Examination: The patient is a very pleasant 73 y.o. male in no acute distress. He appears well-developed and well-nourished and well groomed.  He is anxious appearing.  Intermittently, takes a deep breath.  HEENT: Normocephalic, atraumatic, pupils are equal, round and reactive to light, extraocular tracking is good without limitation to gaze excursion or nystagmus noted. Hearing is grossly intact. Face is symmetric with normal facial animation to minimal facial masking. Speech is clear with no dysarthria noted. There is no hypophonia. There is no lip,  neck/head, jaw or voice tremor. Neck generally with good range of motion but there is some nuchal rigidity upon side-to-side passive turns. There are no carotid bruits on auscultation. Oropharynx exam reveals: mild mouth dryness, adequate dental hygiene and moderate airway crowding. Tongue protrudes centrally and palate elevates symmetrically.      Chest: Clear to auscultation without wheezing, rhonchi or crackles noted.   Heart: S1+S2+0, mild bradycardia noted, slightly irregular heart rate noted.    Abdomen: Soft, non-tender and non-distended.   Extremities: There is no pitting edema in the distal lower extremities bilaterally.    Skin: Warm and dry without trophic changes noted.    Musculoskeletal: exam reveals no obvious joint deformities.    Neurologically:  Mental status: The patient is awake, alert and oriented in all 4 spheres. His immediate and remote memory, attention, language skills and fund of knowledge are appropriate. There is no evidence of aphasia, agnosia, apraxia or anomia. Speech is clear with normal prosody and enunciation. Thought process is linear. Mood is normal and affect is normal.  Cranial nerves II - XII are as described above under HEENT exam.  Motor exam: Normal bulk, has a difficult time relaxing but could have mild increase in tone in the left upper extremity only.  He has an intermittent resting tremor in the left upper extremity only.   On specific fine motor testing, with finger taps, hand movements and rapid alternating patterning, he has mild difficulty on the left, minimal or normal on the right.  Same with foot taps. Cerebellar testing: No dysmetria or intention tremor. There is no truncal or gait ataxia.  Normal finger-to-nose, normal heel-to-shin. Sensory exam: intact to light touch in the upper and lower extremities.  Reflexes 1+ to 2+ throughout, toes are downgoing bilaterally. Gait, station and balance: He stands without difficulty, posture is mildly  stooped for age, he walks without any limp or shuffling, slight tilt of the upper body to the right noted, slight decrease in arm swing on the left.    Assessment and Plan:  In summary, Ayub Kirsh is a 73 year old male with an underlying medical history of allergies, arthritis, hypertension, hyperlipidemia, restless leg syndrome, low back pain, tremor, neck pain, status post low back surgery, overweight state, paroxysmal A-fib, and recent diagnosis of severe obstructive sleep apnea, who presents for evaluation of his tremor disorder of several years duration.  History and examination are in keeping with mild parkinsonism.  I had a long discussion with the patient regarding this possibility, and possible next steps.  We talked about diagnostic evaluation.  While we may consider a brain MRI in the near future, I would like to proceed with a brain DaTscan.  I explained the scan to him in detail and provided written information in printed form for him.  He is willing to proceed.  We may consider symptomatic medication. He is supposed to get set up on CPAP at home.  We will expedite the process and have been in touch with his DME provider today.   We will plan to follow-up after DaTscan completion and also for his initial CPAP follow-up.   I answered all his questions today and he was in agreement with our plan.   Thank you very much for allowing me to participate in the care of this nice patient. If I can be of any further assistance to you please do not hesitate to call me at [phone].   Sincerely,     True Mar, MD, PhD

## 2024-08-30 NOTE — Patient Instructions (Signed)
 It was nice to see you again today.  As discussed, I have noticed mild parkinsonian changes on your exam. I would like to proceed with a so-called DaT scan: This is a specialized brain scan designed to help with diagnosis of tremor disorders. A radioactive marker gets injected and the uptake is measured in the brain and compared to normal controls and right side is compared to the left, a change in uptake can help with diagnosis of certain tremor disorders. A brain MRI on the other hand is a brain scan that helps look at the brain structure in more detail overall and look for age-related changes, blood vessel related changes and look for stroke and volume loss which we call atrophy.  We will keep you posted as to the test results by phone call and plan to follow-up accordingly, also follow-up in sleep clinic in about 3 months after starting CPAP therapy.  We will try to expedite your CPAP therapy set up at home.

## 2024-08-31 ENCOUNTER — Encounter: Payer: Self-pay | Admitting: Family Medicine

## 2024-08-31 MED ORDER — HYDROCODONE-ACETAMINOPHEN 5-325 MG PO TABS: 1.0000 | ORAL_TABLET | Freq: Four times a day (QID) | ORAL | 0 refills | Status: DC | PRN

## 2024-08-31 NOTE — Telephone Encounter (Signed)
 See note

## 2024-08-31 NOTE — Telephone Encounter (Signed)
 I appreciate the update. I sent in his prescription today.  Worth HERO. Kennyth, MD 08/31/2024 11:23 AM

## 2024-09-04 ENCOUNTER — Telehealth: Payer: Self-pay | Admitting: Neurology

## 2024-09-04 NOTE — Telephone Encounter (Signed)
 [employer] medicare shara: [id number] exp. 09/04/24-10/19/24 sent to [facility]. [phone]

## 2024-09-25 ENCOUNTER — Ambulatory Visit: Payer: Self-pay | Admitting: Neurology

## 2024-09-25 ENCOUNTER — Ambulatory Visit (HOSPITAL_COMMUNITY)
Admission: RE | Admit: 2024-09-25 | Discharge: 2024-09-25 | Disposition: A | Discharge status: Home / Self Care | Source: Ambulatory Visit | Attending: Neurology | Admitting: Neurology

## 2024-09-25 ENCOUNTER — Encounter (HOSPITAL_COMMUNITY): Payer: Self-pay

## 2024-09-25 ENCOUNTER — Encounter: Payer: Self-pay | Admitting: Family Medicine

## 2024-09-25 ENCOUNTER — Encounter (HOSPITAL_COMMUNITY)
Admission: RE | Admit: 2024-09-25 | Discharge: 2024-09-25 | Disposition: A | Discharge status: Home / Self Care | Source: Ambulatory Visit | Attending: Neurology | Admitting: Neurology

## 2024-09-25 DIAGNOSIS — R251 Tremor, unspecified: Secondary | ICD-10-CM

## 2024-09-25 DIAGNOSIS — G4733 Obstructive sleep apnea (adult) (pediatric): Secondary | ICD-10-CM

## 2024-09-25 DIAGNOSIS — R29818 Other symptoms and signs involving the nervous system: Secondary | ICD-10-CM | POA: Insufficient documentation

## 2024-09-25 MED ORDER — IOFLUPANE I 123 185 MBQ/2.5ML IV SOLN
5.0000 | Freq: Once | INTRAVENOUS | Status: AC
  Administered 2024-09-25: 4.7 via INTRAVENOUS
  Filled 2024-09-25: qty 5

## 2024-09-25 MED ORDER — POTASSIUM IODIDE (ANTIDOTE) 130 MG PO TABS
ORAL_TABLET | ORAL | Status: AC
  Filled 2024-09-25: qty 1

## 2024-09-26 NOTE — Telephone Encounter (Signed)
-----   Message from True Mar sent at 09/25/2024  5:01 PM EST ----- Please call patient and advise him that his recent brain DaTscan shows borderline abnormal findings and is not excluding underlying parkinsonism but is not supporting it with a clear abnormality.   This is called an equivocal exam and we can certainly consider repeating it down the road in the future if need be.  For now, we can follow him clinically.  He should be on autoPAP therapy by now and  can follow-up to see Harlene as scheduled for his sleep apnea.  Please offer a follow-up with me in April or May.  ----- Message ----- From: Rebecka, Rad Results In Sent: 09/25/2024   4:51 PM EST To: True Mar, MD

## 2024-09-26 NOTE — Telephone Encounter (Signed)
 Called and LMVM for pt to return call for [facility] results.

## 2024-09-26 NOTE — Telephone Encounter (Signed)
 See note  Last refill 08/31/2024 Rx Narco #90 no refills

## 2024-09-27 MED ORDER — HYDROCODONE-ACETAMINOPHEN 5-325 MG PO TABS: 1.0000 | ORAL_TABLET | Freq: Four times a day (QID) | ORAL | 0 refills | Status: DC | PRN

## 2024-10-02 ENCOUNTER — Encounter: Payer: Self-pay | Admitting: *Deleted

## 2024-10-02 NOTE — Telephone Encounter (Signed)
 Third attempt:  Called pt and LVM (ok per DPR) asking for call back to discuss his DaTscan results and the next steps. I left our office number and hours in the message.

## 2024-10-02 NOTE — Progress Notes (Signed)
 I also mailed an unable to contact letter asking for call back.

## 2024-10-03 ENCOUNTER — Other Ambulatory Visit (HOSPITAL_BASED_OUTPATIENT_CLINIC_OR_DEPARTMENT_OTHER): Payer: Self-pay

## 2024-10-11 ENCOUNTER — Other Ambulatory Visit: Payer: Self-pay | Admitting: Cardiovascular Disease

## 2024-10-21 ENCOUNTER — Encounter: Payer: Self-pay | Admitting: Family Medicine

## 2024-10-22 MED ORDER — HYDROCODONE-ACETAMINOPHEN 5-325 MG PO TABS: 1.0000 | ORAL_TABLET | Freq: Four times a day (QID) | ORAL | 0 refills | Status: DC | PRN

## 2024-10-22 NOTE — Telephone Encounter (Signed)
Patient notified via MyChart of medication refill approval

## 2024-10-23 ENCOUNTER — Other Ambulatory Visit: Payer: Self-pay | Admitting: Family Medicine

## 2024-10-23 DIAGNOSIS — M5416 Radiculopathy, lumbar region: Secondary | ICD-10-CM

## 2024-10-30 ENCOUNTER — Encounter: Admitting: Neurology

## 2024-11-06 ENCOUNTER — Ambulatory Visit (INDEPENDENT_AMBULATORY_CARE_PROVIDER_SITE_OTHER)

## 2024-11-06 VITALS — BP 123/70 | Ht 71.0 in | Wt 181.0 lb

## 2024-11-06 DIAGNOSIS — Z0000 Encounter for general adult medical examination without abnormal findings: Secondary | ICD-10-CM

## 2024-11-06 NOTE — Patient Instructions (Signed)
 Mr. Bott,  Thank you for taking the time for your Medicare Wellness Visit. I appreciate your continued commitment to your health goals. Please review the care plan we discussed, and feel free to reach out if I can assist you further.  Please note that Annual Wellness Visits do not include a physical exam. Some assessments may be limited, especially if the visit was conducted virtually. If needed, we may recommend an in-person follow-up with your provider.  Ongoing Care Seeing your primary care provider every 3 to 6 months helps us  monitor your health and provide consistent, personalized care.   Referrals If a referral was made during today's visit and you haven't received any updates within two weeks, please contact the referred provider directly to check on the status.  Recommended Screenings:  Health Maintenance  Topic Date Due   Zoster (Shingles) Vaccine (1 of 2) Never done   Pneumococcal Vaccine for age over 34 (1 of 2 - PCV) 11/28/2024*   Hepatitis C Screening  11/28/2024*   Flu Shot  02/12/2025*   Colon Cancer Screening  05/22/2025*   Medicare Annual Wellness Visit  11/06/2025   Meningitis B Vaccine  Aged Out   DTaP/Tdap/Td vaccine  Discontinued   COVID-19 Vaccine  Discontinued  *Topic was postponed. The date shown is not the original due date.       11/06/2024    9:27 AM  Advanced Directives  Does Patient Have a Medical Advance Directive? Yes  Type of Estate Agent of [place];Living will  Copy of Healthcare Power of Attorney in Chart? No - copy requested    Vision: Annual vision screenings are recommended for early detection of glaucoma, cataracts, and diabetic retinopathy. These exams can also reveal signs of chronic conditions such as diabetes and high blood pressure.  Dental: Annual dental screenings help detect early signs of oral cancer, gum disease, and other conditions linked to overall health, including heart disease and  diabetes.  Please see the attached documents for additional preventive care recommendations.

## 2024-11-06 NOTE — Progress Notes (Addendum)
 "  Chief Complaint  Patient presents with   Medicare Wellness     Subjective:   Jonathan Beasley is a 73 y.o. male who presents for a Medicare Annual Wellness Visit.  Visit info / Clinical Intake: Medicare Wellness Visit Type:: Subsequent Annual Wellness Visit Persons participating in visit and providing information:: patient Medicare Wellness Visit Mode:: Telephone If telephone:: video declined Since this visit was completed virtually, some vitals may be partially provided or unavailable. Missing vitals are due to the limitations of the virtual format.: Documented vitals are patient reported If Telephone or Video please confirm:: I connected with patient using audio/video enable telemedicine. I verified patient identity with two identifiers, discussed telehealth limitations, and patient agreed to proceed. Patient Location:: home Provider Location:: home office Interpreter Needed?: No Pre-visit prep was completed: yes AWV questionnaire completed by patient prior to visit?: no Living arrangements:: lives with spouse/significant other; with family/others Patient's Overall Health Status Rating: very good Typical amount of pain: some Does pain affect daily life?: (!) yes Are you currently prescribed opioids?: (!) yes  Dietary Habits and Nutritional Risks How many meals a day?: 3 Eats fruit and vegetables daily?: yes Most meals are obtained by: preparing own meals; eating out In the last 2 weeks, have you had any of the following?: none Diabetic:: no  Functional Status Activities of Daily Living (to include ambulation/medication): Independent Ambulation: Independent with device- listed below Home Assistive Devices/Equipment: CPAP; Eyeglasses Medication Administration: Independent Home Management (perform basic housework or laundry): Independent Manage your own finances?: yes Primary transportation is: driving Concerns about vision?: no *vision screening is required for  WTM* Concerns about hearing?: no  Fall Screening Falls in the past year?: 0 Number of falls in past year: 0 Was there an injury with Fall?: 0 Fall Risk Category Calculator: 0 Patient Fall Risk Level: Low Fall Risk  Fall Risk Patient at Risk for Falls Due to: No Fall Risks Fall risk Follow up: Falls evaluation completed  Home and Transportation Safety: All rugs have non-skid backing?: N/A, no rugs All stairs or steps have railings?: N/A, no stairs Grab bars in the bathtub or shower?: yes Have non-skid surface in bathtub or shower?: yes Good home lighting?: yes Regular seat belt use?: yes Hospital stays in the last year:: no  Cognitive Assessment Difficulty concentrating, remembering, or making decisions? : no Will 6CIT or Mini Cog be Completed: no 6CIT or Mini Cog Declined: patient alert, oriented, able to answer questions appropriately and recall recent events  Advance Directives (For Healthcare) Does Patient Have a Medical Advance Directive?: Yes Type of Advance Directive: Healthcare Power of [place]; Living will Copy of Healthcare Power of Attorney in Chart?: No - copy requested Copy of Living Will in Chart?: No - copy requested  Reviewed/Updated  Reviewed/Updated: Reviewed All (Medical, Surgical, Family, Medications, Allergies, Care Teams, Patient Goals)    Allergies (verified) Penicillins and Shrimp (diagnostic)   Current Medications (verified) Outpatient Encounter Medications as of 11/06/2024  Medication Sig   Ascorbic Acid (VITAMIN C) 500 MG CHEW Chew 250 mg by mouth daily.   aspirin EC 81 MG tablet Take 81 mg by mouth daily. Swallow whole.   Coenzyme Q10 (CO Q-10) 200 MG CAPS Take 200 mg by mouth at bedtime.   cyclobenzaprine  (FLEXERIL ) 10 MG tablet TAKE 1 TABLET BY MOUTH THREE TIMES A DAY AS NEEDED   gabapentin  (NEURONTIN ) 100 MG capsule TAKE 1-2 AT BEDTIME AS NEEDED.   gabapentin  (NEURONTIN ) 300 MG capsule TAKE 2 CAPSULES BY MOUTH  3 TIMES DAILY.    hydrochlorothiazide  (HYDRODIURIL ) 25 MG tablet TAKE 1 TABLET (25 MG TOTAL) BY MOUTH DAILY.   HYDROcodone -acetaminophen  (NORCO/VICODIN) 5-325 MG tablet Take 1 tablet by mouth every 6 (six) hours as needed for moderate pain (pain score 4-6).   Inositol Niacinate (NIACIN  FLUSH FREE) 500 MG CAPS Take 500 mg by mouth at bedtime.   losartan  (COZAAR ) 50 MG tablet TAKE 1 TABLET BY MOUTH EVERY DAY   Multiple Vitamin (MULTIVITAMIN) tablet Take 1 tablet by mouth daily.   rosuvastatin  (CRESTOR ) 20 MG tablet Take 1 tablet (20 mg total) by mouth daily. [facility] contact [facility] to make overdue follow up appointment for further refills.  [facility] [phone].  1st attempt.   apixaban  (ELIQUIS ) 5 MG TABS tablet Take 1 tablet (5 mg total) by mouth 2 (two) times daily. (Patient not taking: Reported on 11/06/2024)   nitroGLYCERIN  (NITRODUR - DOSED IN MG/24 HR) 0.2 mg/hr patch SMARTSIG:0.25 Patch(s) T-DERMAL Daily   No facility-administered encounter medications on file as of 11/06/2024.    History: Past Medical History:  Diagnosis Date   Achilles tendonitis 07/26/2013   Allergy    Arthritis    Dysrhythmia    A-fib (onset 11/17/22)   Hyperlipidemia    Hypertension    Loose body in knee 06/02/2010   Mallet finger 08/03/2011   Restless legs    Tear of right hamstring 03/01/2017   Past Surgical History:  Procedure Laterality Date   KNEE ARTHROSCOPY Right 11/16/2011   LUMBAR LAMINECTOMY/DECOMPRESSION MICRODISCECTOMY Left 11/29/2022   Procedure: Laminectomy for facet/synovial cyst - L4-L5 - left;  Surgeon: Joshua Alm RAMAN, MD;  Location: [facility] OR;  Service: Neurosurgery;  Laterality: Left;  3C   SPINE SURGERY  11/29/2022   Family History  Problem Relation Age of Onset   Other Mother        doesn't sleep well   Heart disease Father    Heart disease Maternal Grandfather    Heart disease Paternal Grandfather    Parkinson's disease Neg Hx    Social History   Occupational History   Occupation: MARKETING   Tobacco Use   Smoking status: Never   Smokeless tobacco: Never  Vaping Use   Vaping status: Never Used  Substance and Sexual Activity   Alcohol use: No   Drug use: No   Sexual activity: Yes   Tobacco Counseling Counseling given: Not Answered  SDOH Screenings   Food Insecurity: No Food Insecurity (11/06/2024)  Housing: Unknown (11/06/2024)  Transportation Needs: No Transportation Needs (11/06/2024)  Utilities: Not At Risk (11/06/2024)  Alcohol Screen: Low Risk (11/06/2024)  Depression (PHQ2-9): Low Risk (11/06/2024)  Financial Resource Strain: Low Risk (06/11/2024)  Physical Activity: Sufficiently Active (11/06/2024)  Social Connections: Socially Integrated (11/06/2024)  Stress: No Stress Concern Present (11/06/2024)  Tobacco Use: Low Risk (11/06/2024)  Health Literacy: Adequate Health Literacy (11/06/2024)   See flowsheets for full screening details  Depression Screen PHQ 2 & 9 Depression Scale- Over the past 2 weeks, how often have you been bothered by any of the following problems? Little interest or pleasure in doing things: 0 Feeling down, depressed, or hopeless (PHQ Adolescent also includes...irritable): 0 PHQ-2 Total Score: 0     Goals Addressed               This Visit's Progress     maintain health and activity (pt-stated)        Maintain health and activity  Objective:    Today's Vitals   11/06/24 0924  BP: 123/70  Weight: 181 lb (82.1 kg)  Height: 5' 11 (1.803 m)   Body mass index is 25.24 kg/m.  Hearing/Vision screen Hearing Screening - Comments:: Pt denies any hearing issues  Vision Screening - Comments:: Wears rx glasses - up to date with routine eye exams with Dr Leslee  Immunizations and Health Maintenance Health Maintenance  Topic Date Due   Zoster Vaccines- Shingrix (1 of 2) Never done   Pneumococcal Vaccine: 50+ Years (1 of 2 - PCV) 11/28/2024 (Originally 12/31/1969)   Hepatitis C Screening  11/28/2024 (Originally  [birth year]31[birth year]1969)   Influenza Vaccine  02/12/2025 (Originally 06/15/2024)   Colonoscopy  05/22/2025 (Originally 12/31/1996)   Medicare Annual Wellness (AWV)  11/06/2025   Meningococcal B Vaccine  Aged Out   DTaP/Tdap/Td  Discontinued   COVID-19 Vaccine  Discontinued        Assessment/Plan:  This is a routine wellness examination for Jonathan Beasley.  Patient Care Team: Kennyth Worth HERO, MD as PCP - General (Family Medicine) Croitoru, Jerel, MD as Consulting Physician (Cardiology)  I have personally reviewed and noted the following in the patients chart:   Medical and social history Use of alcohol, tobacco or illicit drugs  Current medications and supplements including opioid prescriptions. Functional ability and status Nutritional status Physical activity Advanced directives List of other physicians Hospitalizations, surgeries, and ER visits in previous 12 months Vitals Screenings to include cognitive, depression, and falls Referrals and appointments  No orders of the defined types were placed in this encounter.  In addition, I have reviewed and discussed with patient certain preventive protocols, quality metrics, and best practice recommendations. A written personalized care plan for preventive services as well as general preventive health recommendations were provided to patient.   Ellouise VEAR Haws, LPN   [id number]/76/7974   Return in about 1 year (around 11/11/2025).  After Visit Summary: (MyChart) Due to this being a telephonic visit, the after visit summary with patients personalized plan was offered to patient via MyChart   Nurse Notes: No voiced or noted concerns at this time  "

## 2024-11-12 ENCOUNTER — Encounter: Payer: Self-pay | Admitting: Family Medicine

## 2024-11-13 ENCOUNTER — Other Ambulatory Visit: Payer: Self-pay | Admitting: Cardiovascular Disease

## 2024-11-13 ENCOUNTER — Other Ambulatory Visit: Payer: Self-pay | Admitting: Family Medicine

## 2024-11-13 MED ORDER — HYDROCODONE-ACETAMINOPHEN 5-325 MG PO TABS: 1.0000 | ORAL_TABLET | Freq: Four times a day (QID) | ORAL | 0 refills | Status: DC | PRN

## 2024-11-13 NOTE — Telephone Encounter (Signed)
 Copied from CRM #[id number]. Topic: Clinical - Medication Refill >> Nov 13, 2024  3:44 PM Brittany M wrote: Medication: HYDROcodone -acetaminophen  (NORCO/VICODIN) 5-325 MG tablet  Has the patient contacted their pharmacy? Yes (Agent: If no, request that the patient contact the pharmacy for the refill. If patient does not wish to contact the pharmacy document the reason why and proceed with request.) (Agent: If yes, when and what did the pharmacy advise?)  This is the patient's preferred pharmacy:  [id number] [place], [place] - [address] [place] [address] Phone: [phone] Fax: [phone]  Is this the correct pharmacy for this prescription? Yes If no, delete pharmacy and type the correct one.   Has the prescription been filled recently? Yes  Is the patient out of the medication? Yes  Has the patient been seen for an appointment in the last year OR does the patient have an upcoming appointment? Yes  Can we respond through MyChart? Yes  Agent: Please be advised that Rx refills may take up to 3 business days. We ask that you follow-up with your pharmacy.

## 2024-11-13 NOTE — Telephone Encounter (Signed)
 Last OV: 07/10/2024  Next OV: 11/20/2024  Last Refill: 10/22/2024  Dispense: 90/0

## 2024-11-13 NOTE — Telephone Encounter (Signed)
 Please contact [facility] to make overdue follow up appointment for further refills. [facility] [phone]. 2nd attempt

## 2024-11-20 ENCOUNTER — Telehealth: Payer: Self-pay | Admitting: Adult Health

## 2024-11-20 ENCOUNTER — Ambulatory Visit: Admitting: Family Medicine

## 2024-11-20 NOTE — Progress Notes (Unsigned)
 " [facility] [address] [place]. [place] [address] [phone]       OFFICE FOLLOW UP NOTE  Mr. Matthe Sloane Date of Birth:  February 25, 1951 Medical Record Number:  [id number]    Primary neurologist: Dr. Buck Reason for visit: Initial CPAP follow-up    SUBJECTIVE:   CHIEF COMPLAINT:  No chief complaint on file.   Follow-up visit:  Prior visit: 08/30/2024  Brief HPI:   Jonathan Beasley is a 74 y.o. male with PMH of HTN, HLD, RLS, low back pain s/p surgery, neck pain, history of A-fib and overweight state who was evaluated by Dr. Buck in 06/2024 for concern of underlying sleep apnea with complaints of snoring, sleep disruption and difficulty initiating sleep.  Current use of gabapentin  for RLS per PCP.  Nocturnal polysomnography 07/2024 which showed severe sleep apnea qualifying for emergency split study with total AHI of 45.8/h and O2 nadir of 85% but noted absence of REM sleep likely underestimating sleep disordered breathing including O2 nadir.  Noted significant reduction of apnea on CPAP of 10 and was placed on CPAP 11 with EPR 2.  CPAP initiated 08/2024.  Patient return for visit in 08/2024 for evaluation of tremor which was noted at prior visit.  Tremor ongoing for years primarily affecting left side with gradual worsening.  Exam consistent with mild parkinsonism and recommended proceeding with DaTscan  and consideration of symptomatic medication.  DaTscan  09/2024 showed borderline abnormal findings and is not excluding underlying parkinsonism but not supporting it with a clear abnormality.  Recommended consideration of repeating in the future if needed and for now following clinically.     Interval history:  Patient returns for initial CPAP compliance visit.   Patient has adjusted pressure setting, it was changed from set pressure to auto pressure on 11/14 and gradually adjusted pressure settings on the same day to current pressure settings.        ROS:   14 system review of systems performed and negative with exception of those listed in HPI  PMH:  Past Medical History:  Diagnosis Date   Achilles tendonitis 07/26/2013   Allergy    Arthritis    Dysrhythmia    A-fib (onset 11/17/22)   Hyperlipidemia    Hypertension    Loose body in knee 06/02/2010   Mallet finger 08/03/2011   Restless legs    Tear of right hamstring 03/01/2017    PSH:  Past Surgical History:  Procedure Laterality Date   KNEE ARTHROSCOPY Right 11/16/2011   LUMBAR LAMINECTOMY/DECOMPRESSION MICRODISCECTOMY Left 11/29/2022   Procedure: Laminectomy for facet/synovial cyst - L4-L5 - left;  Surgeon: Joshua Alm RAMAN, MD;  Location: [facility] OR;  Service: Neurosurgery;  Laterality: Left;  3C   SPINE SURGERY  11/29/2022    Social History:  Social History   Socioeconomic History   Marital status: Married    Spouse name: Not on file   Number of children: Not on file   Years of education: Not on file   Highest education level: Master's degree (e.g., MA, MS, MEng, MEd, MSW, MBA)  Occupational History   Occupation: MARKETING  Tobacco Use   Smoking status: Never   Smokeless tobacco: Never  Vaping Use   Vaping status: Never Used  Substance and Sexual Activity   Alcohol use: No   Drug use: No   Sexual activity: Yes  Other Topics Concern   Not on file  Social History Narrative   Married   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  Exercises      Caffeine: 1 mtn dew/day   Social Drivers of Health   Tobacco Use: Low Risk (11/06/2024)   Patient History    Smoking Tobacco Use: Never    Smokeless Tobacco Use: Never    Passive Exposure: Not on file  Financial Resource Strain: Low Risk (06/11/2024)   Overall Financial Resource Strain (CARDIA)    Difficulty of Paying Living Expenses: Not hard at all  Food Insecurity: No Food Insecurity (11/06/2024)   Epic    Worried About Radiation Protection Practitioner of Food in the Last Year: Never true    Ran Out of Food in the Last Year: Never  true  Transportation Needs: No Transportation Needs (11/06/2024)   Epic    Lack of Transportation (Medical): No    Lack of Transportation (Non-Medical): No  Physical Activity: Sufficiently Active (11/06/2024)   Exercise Vital Sign    Days of Exercise per Week: 7 days    Minutes of Exercise per Session: 120 min  Stress: No Stress Concern Present (11/06/2024)   [employer] of Occupational Health - Occupational Stress Questionnaire    Feeling of Stress: Not at all  Social Connections: Socially Integrated (11/06/2024)   Social Connection and Isolation Panel    Frequency of Communication with Friends and Family: More than three times a week    Frequency of Social Gatherings with Friends and Family: More than three times a week    Attends Religious Services: More than 4 times per year    Active Member of Clubs or Organizations: Yes    Attends Banker Meetings: 1 to 4 times per year    Marital Status: Married  Catering Manager Violence: Not At Risk (11/06/2024)   Epic    Fear of Current or Ex-Partner: No    Emotionally Abused: No    Physically Abused: No    Sexually Abused: No  Depression (PHQ2-9): Low Risk (11/06/2024)   Depression (PHQ2-9)    PHQ-2 Score: 0  Alcohol Screen: Low Risk (11/06/2024)   Alcohol Screen    Last Alcohol Screening Score (AUDIT): 0  Housing: Unknown (11/06/2024)   Epic    Unable to Pay for Housing in the Last Year: No    Number of Times Moved in the Last Year: Not on file    Homeless in the Last Year: No  Utilities: Not At Risk (11/06/2024)   Epic    Threatened with loss of utilities: No  Health Literacy: Adequate Health Literacy (11/06/2024)   B1300 Health Literacy    Frequency of need for help with medical instructions: Never    Family History:  Family History  Problem Relation Age of Onset   Other Mother        doesn't sleep well   Heart disease Father    Heart disease Maternal Grandfather    Heart disease Paternal  Grandfather    Parkinson's disease Neg Hx     Medications:  Medications Ordered Prior to Encounter[1]  Allergies:  Allergies[2]    OBJECTIVE:  Physical Exam  There were no vitals filed for this visit. There is no height or weight on file to calculate BMI. No results found.   General: well developed, well nourished, seated, in no evident distress Head: head normocephalic and atraumatic.   Neck: supple with no carotid or supraclavicular bruits Cardiovascular: regular rate and rhythm, no murmurs  Neurologic Exam Mental Status: Awake and fully alert. Oriented to place and time. Recent and remote memory intact. Attention span, concentration  and fund of knowledge appropriate. Mood and affect appropriate.  Cranial Nerves: Pupils equal, briskly reactive to light. Extraocular movements full without nystagmus. Visual fields full to confrontation. Hearing intact. Facial sensation intact. Face, tongue, palate moves normally and symmetrically.  Motor: Normal bulk and tone. Normal strength in all tested extremity muscles Gait and Station: Arises from chair without difficulty. Stance is normal. Gait demonstrates normal stride length and balance without use of AD.         ASSESSMENT/PLAN: Damacio Weisgerber is a 74 y.o. year old male    OSA on CPAP :  Compliance report shows satisfactory usage with optimal residual AHI.   Continue current pressure settings *** Discussed continued nightly usage with ensuring greater than 4 hours nightly for optimal benefit and per insurance purposes.   Continue to follow with DME company for any needed supplies or CPAP related concerns CPAP set up ***, due for new machine ***     Follow up in *** or call earlier if needed   CC:  PCP: Kennyth Worth HERO, MD    I personally spent a total of *** minutes in the care of the patient today including {Time Based Coding:[id number]}.     Harlene Bogaert, AGNP-BC  [facility] [address] [place], [place] [address]  Phone [phone] Fax [phone] Note: This document was prepared with digital dictation and possible smart phrase technology. Any transcriptional errors that result from this process are unintentional.         [1]  Current Outpatient Medications on File Prior to Visit  Medication Sig Dispense Refill   apixaban  (ELIQUIS ) 5 MG TABS tablet Take 1 tablet (5 mg total) by mouth 2 (two) times daily. (Patient not taking: Reported on 11/06/2024) 60 tablet 3   Ascorbic Acid (VITAMIN C) 500 MG CHEW Chew 250 mg by mouth daily.     aspirin EC 81 MG tablet Take 81 mg by mouth daily. Swallow whole.     Coenzyme Q10 (CO Q-10) 200 MG CAPS Take 200 mg by mouth at bedtime.     cyclobenzaprine  (FLEXERIL ) 10 MG tablet TAKE 1 TABLET BY MOUTH THREE TIMES A DAY AS NEEDED 90 tablet 3   gabapentin  (NEURONTIN ) 100 MG capsule TAKE 1-2 AT BEDTIME AS NEEDED. 180 capsule 3   gabapentin  (NEURONTIN ) 300 MG capsule TAKE 2 CAPSULES BY MOUTH 3 TIMES DAILY. 180 capsule 2   hydrochlorothiazide  (HYDRODIURIL ) 25 MG tablet TAKE 1 TABLET (25 MG TOTAL) BY MOUTH DAILY. 90 tablet 0   HYDROcodone -acetaminophen  (NORCO/VICODIN) 5-325 MG tablet Take 1 tablet by mouth every 6 (six) hours as needed for moderate pain (pain score 4-6). 90 tablet 0   Inositol Niacinate (NIACIN  FLUSH FREE) 500 MG CAPS Take 500 mg by mouth at bedtime.     losartan  (COZAAR ) 50 MG tablet TAKE 1 TABLET BY MOUTH EVERY DAY 90 tablet 1   Multiple Vitamin (MULTIVITAMIN) tablet Take 1 tablet by mouth daily.     nitroGLYCERIN  (NITRODUR - DOSED IN MG/24 HR) 0.2 mg/hr patch SMARTSIG:0.25 Patch(s) T-DERMAL Daily     rosuvastatin  (CRESTOR ) 20 MG tablet TAKE 1 TABLET BY MOUTH DAILY. OVERDUE FOLLOW UP APPOINTMENT FOR FURTHER REFILLS. 90 tablet 0   No current facility-administered medications on file prior to visit.  [2]  Allergies Allergen Reactions   Penicillins Other (See Comments)    Childhood- deathly ill    Shrimp (Diagnostic) Rash    Face turns red   "

## 2024-11-20 NOTE — Telephone Encounter (Signed)
 MYC conf

## 2024-11-21 ENCOUNTER — Ambulatory Visit: Admitting: Adult Health

## 2024-11-21 ENCOUNTER — Encounter: Payer: Self-pay | Admitting: Adult Health

## 2024-11-21 VITALS — BP 155/98 | HR 72 | Ht 71.0 in | Wt 185.0 lb

## 2024-11-21 DIAGNOSIS — R251 Tremor, unspecified: Secondary | ICD-10-CM | POA: Diagnosis not present

## 2024-11-21 DIAGNOSIS — G2581 Restless legs syndrome: Secondary | ICD-10-CM

## 2024-11-21 DIAGNOSIS — G4733 Obstructive sleep apnea (adult) (pediatric): Secondary | ICD-10-CM

## 2024-11-21 DIAGNOSIS — R29818 Other symptoms and signs involving the nervous system: Secondary | ICD-10-CM

## 2024-11-21 NOTE — Patient Instructions (Addendum)
 Your Plan:  Ensure nightly use of CPAP with more than 4 hours per night for adequate sleep apnea management   Will adjust pressure settings and will recheck download in 1 month for re evaluation      Follow up in 3-4 months with Dr. Buck      Thank you for coming to see us  at [facility]. I hope we have been able to provide you high quality care today.  You may receive a patient satisfaction survey over the next few weeks. We would appreciate your feedback and comments so that we may continue to improve ourselves and the health of our patients.

## 2024-11-22 NOTE — Progress Notes (Signed)
 SABRA

## 2024-11-28 ENCOUNTER — Other Ambulatory Visit (HOSPITAL_BASED_OUTPATIENT_CLINIC_OR_DEPARTMENT_OTHER): Payer: Self-pay

## 2024-12-05 ENCOUNTER — Encounter: Payer: Self-pay | Admitting: Family Medicine

## 2024-12-06 MED ORDER — HYDROCODONE-ACETAMINOPHEN 5-325 MG PO TABS: 1.0000 | ORAL_TABLET | Freq: Four times a day (QID) | ORAL | 0 refills | Status: AC | PRN

## 2024-12-07 ENCOUNTER — Other Ambulatory Visit: Payer: Self-pay | Admitting: Family Medicine

## 2024-12-11 ENCOUNTER — Encounter: Admitting: Family Medicine

## 2024-12-11 NOTE — Telephone Encounter (Signed)
 Is provider willing to overbook to move CPE up per patient's request? Please advise

## 2024-12-11 NOTE — Telephone Encounter (Signed)
 Copied from CRM #[id number]. Topic: Appointments - Scheduling Inquiry for Clinic >> Dec 10, 2024  2:36 PM Roselie BROCKS wrote: Reason for CRM: Patient had a physical scheduled for 12-11-24 , due to weather needed to reschedule. First available isn't until July. Patient states with his health issues ,he prefers to speak to someone in clinic to get scheduled sooner.

## 2024-12-11 NOTE — Telephone Encounter (Signed)
 Ok to schedule in any open slot

## 2024-12-27 ENCOUNTER — Encounter: Admitting: Family Medicine

## 2025-03-12 ENCOUNTER — Ambulatory Visit: Admitting: Neurology

## 2025-05-20 ENCOUNTER — Encounter: Admitting: Family Medicine

## 2025-11-12 ENCOUNTER — Ambulatory Visit
